# Patient Record
Sex: Female | Born: 1990
Health system: Southern US, Community
[De-identification: ages and names within clinical notes are randomized; demographics above are authoritative.]

## PROBLEM LIST (undated history)

## (undated) DIAGNOSIS — Z8489 Family history of other specified conditions: Secondary | ICD-10-CM

## (undated) DIAGNOSIS — R102 Pelvic and perineal pain: Secondary | ICD-10-CM

## (undated) DIAGNOSIS — R112 Nausea with vomiting, unspecified: Secondary | ICD-10-CM

## (undated) DIAGNOSIS — E039 Hypothyroidism, unspecified: Secondary | ICD-10-CM

## (undated) DIAGNOSIS — F419 Anxiety disorder, unspecified: Secondary | ICD-10-CM

## (undated) DIAGNOSIS — Z9889 Other specified postprocedural states: Secondary | ICD-10-CM

## (undated) DIAGNOSIS — K219 Gastro-esophageal reflux disease without esophagitis: Secondary | ICD-10-CM

## (undated) DIAGNOSIS — J189 Pneumonia, unspecified organism: Secondary | ICD-10-CM

## (undated) DIAGNOSIS — T1490XA Injury, unspecified, initial encounter: Secondary | ICD-10-CM

## (undated) DIAGNOSIS — Z87820 Personal history of traumatic brain injury: Secondary | ICD-10-CM

## (undated) DIAGNOSIS — R011 Cardiac murmur, unspecified: Secondary | ICD-10-CM

## (undated) DIAGNOSIS — M797 Fibromyalgia: Secondary | ICD-10-CM

## (undated) DIAGNOSIS — F329 Major depressive disorder, single episode, unspecified: Secondary | ICD-10-CM

## (undated) DIAGNOSIS — F32A Depression, unspecified: Secondary | ICD-10-CM

## (undated) HISTORY — PX: TONSILLECTOMY: SUR1361

## (undated) HISTORY — PX: ADENOIDECTOMY: SHX5191

## (undated) HISTORY — DX: Fibromyalgia: M79.7

## (undated) HISTORY — PX: SPINE SURGERY: SHX786

---
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
## 2009-04-28 ENCOUNTER — Emergency Department (HOSPITAL_COMMUNITY): Admission: EM | Admit: 2009-04-28 | Discharge: 2009-04-28 | Payer: Self-pay | Admitting: *Deleted

## 2009-04-28 DIAGNOSIS — J36 Peritonsillar abscess: Secondary | ICD-10-CM | POA: Insufficient documentation

## 2009-04-29 ENCOUNTER — Inpatient Hospital Stay (HOSPITAL_COMMUNITY): Admission: AD | Admit: 2009-04-29 | Discharge: 2009-05-08 | Payer: Self-pay | Admitting: Otolaryngology

## 2009-04-29 ENCOUNTER — Ambulatory Visit: Payer: Self-pay | Admitting: Pulmonary Disease

## 2009-05-23 ENCOUNTER — Ambulatory Visit: Payer: Self-pay | Admitting: Infectious Diseases

## 2009-05-23 DIAGNOSIS — R011 Cardiac murmur, unspecified: Secondary | ICD-10-CM | POA: Insufficient documentation

## 2009-05-23 DIAGNOSIS — D72829 Elevated white blood cell count, unspecified: Secondary | ICD-10-CM | POA: Insufficient documentation

## 2009-05-23 DIAGNOSIS — B2790 Infectious mononucleosis, unspecified without complication: Secondary | ICD-10-CM | POA: Insufficient documentation

## 2010-01-13 ENCOUNTER — Ambulatory Visit (HOSPITAL_BASED_OUTPATIENT_CLINIC_OR_DEPARTMENT_OTHER): Admission: RE | Admit: 2010-01-13 | Discharge: 2010-01-13 | Payer: Self-pay | Admitting: Otolaryngology

## 2010-09-12 DIAGNOSIS — T1490XA Injury, unspecified, initial encounter: Secondary | ICD-10-CM

## 2010-09-12 HISTORY — DX: Injury, unspecified, initial encounter: T14.90XA

## 2010-09-27 ENCOUNTER — Inpatient Hospital Stay (HOSPITAL_COMMUNITY)
Admission: EM | Admit: 2010-09-27 | Discharge: 2010-09-30 | Payer: Self-pay | Source: Home / Self Care | Admitting: Emergency Medicine

## 2010-12-12 NOTE — Miscellaneous (Signed)
Summary: HIPAA Restrictions  HIPAA Restrictions   Imported By: Florinda Marker 05/23/2009 14:54:46  _____________________________________________________________________  External Attachment:    Type:   Image     Comment:   External Document

## 2010-12-12 NOTE — Consult Note (Signed)
Summary: New Pt. Referral  New Pt. Referral   Imported By: Florinda Marker 07/14/2009 14:47:09  _____________________________________________________________________  External Attachment:    Type:   Image     Comment:   External Document

## 2010-12-12 NOTE — Assessment & Plan Note (Signed)
Summary: new pt increase wbc,recent admit to hosp&ICU   CC:  new patient.  History of Present Illness: 20 yo F with hx of mononucleosis and  then had 3 peritonsillar abscess- adm to [facility] 04-30-09. She underwent I & D of this on 6-19 after her CT scan showed progression of this. Her CT abd at that time showed splenomegally, her CT chest showed no PE, diffuse infitrate. Her course was complicated by the development of a severe rash. The etiology of this was unclear -TPN vs her antibiotics (vanco, zosyn). All of these were stopped and she was started on solumedrol. The patient was also felt to have pneumonia and after her rash wa improved she was started on 10 days of avleox as well as a steroid taper.  She returns now with increasing, persistent leukocytosis 17.7 (05-18-09), 15.5 (05-12-09). Path review showed reactive leukocytosis, no Eos on diff.  Has been feeling nauseated and dizzy since she has been out of the hospital. Finnished the avelox, will finish a second steroid taper tommorow. Recieved a seconds taper due to continued tonsillar swelling. She feels that they are still swollen, told by her ENT that they may be swollen for months.     Preventive Screening-Counseling & Management  Alcohol-Tobacco     Alcohol drinks/day: 0     Smoking Status: never  Caffeine-Diet-Exercise     Caffeine use/day: occassional tea     Does Patient Exercise: yes     Type of exercise: walks     Exercise (avg: min/session): <30     Times/week: 6  Safety-Violence-Falls     Seat Belt Use: yes      Drug Use:  no.     Updated Prior Medication List: CONCERTA 36 MG CR-TABS (METHYLPHENIDATE HCL) as needed PROMETHAZINE HCL 25 MG SUPP (PROMETHAZINE HCL) as needed ALPRAZOLAM 0.5 MG TABS (ALPRAZOLAM) 1 tab at bedtime as needed ZOLOFT 100 MG TABS (SERTRALINE HCL) 2 tabs once daily  Current Allergies (reviewed today): ! SULFA ! SEPTRA ! CECLOR Family History: ashtma, breast CA, DM, HTN  Social History: Single  Never Smoked Alcohol use-no Drug use-no Drug Use:  no  Review of Systems       1 loose BM this AM, nl urination, nl swallowing (occas pain), no fevers or chills, no abd pain, wt down, noyeast infections since out of hospital , rash mostly resolved. no cough or SOB.   Vital Signs:  Patient profile:   20 year old female Height:      65 inches (165.10 cm) Weight:      188.2 pounds (85.55 kg) BMI:     31.43 Temp:     98.0 degrees F (36.67 degrees C) oral Pulse rate:   97 / minute BP sitting:   130 / 79  (left arm)  Vitals Entered By: Baxter Hire) (May 23, 2009 11:37 AM) CC: new patient Is Patient Diabetic? No Pain Assessment Patient in pain? no      Nutritional Status BMI of > 30 = obese Nutritional Status Detail appetite is fine per patient  Have you ever been in a relationship where you felt threatened, hurt or afraid?Unable to ask-parents in room   Does patient need assistance? Functional Status Self care Ambulation Normal   Physical Exam  General:  well-developed, well-nourished, well-hydrated, and overweight-appearing.   Eyes:  pupils equal and pupils round.   Mouth:  pharynx pink and moist, no exudates, and posterior lymphoid hypertrophy.  she has massive tonsils bilaterally. there is no  exudate or necrosis.  Neck:  no masses.   Lungs:  normal respiratory effort and normal breath sounds.   Heart:  normal rate, regular rhythm, and no murmur.   Abdomen:  soft, non-tender, normal bowel sounds, and no splenomegaly.   Skin:  she has a faint reticulated, macular rash on her UE and her LE.    Impression & Recommendations:  Problem # 1:  LEUKOCYTOSIS (ICD-288.60)  She does to appear toxic in any way. There are several possibilities to consider- demargination from steroids, unresolved tonsilar abscesses,  pneumonia/lung abscess, unresolved adverse drug reaction. She does not have symptoms to match the lung consideration. Her differential does not show Eos as  would expected from an ADR. I suggested to her and her parents that this is most likely due to the steroids (her third course) and that rechecking her WBC next week would be a reasonable course of action. If it continues to be elavated would favor a repeat CT scan of her neck to re-eval her tonsilar tissue. I will ask Dr Yehuda Budd to order the CBC next week, when pt follows up with her. I will ask that the results of this be sent to me, furhter testing appts will be based on this. SHe and her parents are in agreement with this plan.  She asks about resuming activity (horseback riding). Splenic rupture usually occurs between the 4th- 21st day of symptomatic illness. She is well past this and I have suggested that she may resume exerting herself slowly.   Orders: Consultation Level IV (95621)  Medications Added to Medication List This Visit: 1)  Concerta 36 Mg Cr-tabs (Methylphenidate hcl) .... As needed 2)  Promethazine Hcl 25 Mg Supp (Promethazine hcl) .... As needed 3)  Alprazolam 0.5 Mg Tabs (Alprazolam) .Marland Kitchen.. 1 tab at bedtime as needed 4)  Zoloft 100 Mg Tabs (Sertraline hcl) .... 2 tabs once daily

## 2011-01-23 LAB — DIFFERENTIAL
Basophils Absolute: 0 10*3/uL (ref 0.0–0.1)
Basophils Relative: 0 % (ref 0–1)
Eosinophils Absolute: 0.1 10*3/uL (ref 0.0–0.7)
Eosinophils Relative: 1 % (ref 0–5)
Lymphocytes Relative: 14 % (ref 12–46)
Lymphs Abs: 1.8 10*3/uL (ref 0.7–4.0)
Monocytes Absolute: 0.8 10*3/uL (ref 0.1–1.0)
Monocytes Relative: 6 % (ref 3–12)
Neutro Abs: 10.6 10*3/uL — ABNORMAL HIGH (ref 1.7–7.7)
Neutrophils Relative %: 80 % — ABNORMAL HIGH (ref 43–77)

## 2011-01-23 LAB — BASIC METABOLIC PANEL
BUN: 4 mg/dL — ABNORMAL LOW (ref 6–23)
CO2: 28 mEq/L (ref 19–32)
Calcium: 8.2 mg/dL — ABNORMAL LOW (ref 8.4–10.5)
Chloride: 104 mEq/L (ref 96–112)
Creatinine, Ser: 0.61 mg/dL (ref 0.4–1.2)
GFR calc Af Amer: >60 mL/min (ref >60)
GFR calc non Af Amer: >60 mL/min (ref >60)
Glucose, Bld: 132 mg/dL — ABNORMAL HIGH (ref 70–99)
Potassium: 4 mEq/L (ref 3.5–5.1)
Sodium: 137 mEq/L (ref 135–145)

## 2011-01-23 LAB — CBC
HCT: 34.3 % — ABNORMAL LOW (ref 36.0–46.0)
HCT: 34.7 % — ABNORMAL LOW (ref 36.0–46.0)
HCT: 34.9 % — ABNORMAL LOW (ref 36.0–46.0)
HCT: 34.9 % — ABNORMAL LOW (ref 36.0–46.0)
HCT: 35.7 % — ABNORMAL LOW (ref 36.0–46.0)
HCT: 37.5 % (ref 36.0–46.0)
Hemoglobin: 10.7 g/dL — ABNORMAL LOW (ref 12.0–15.0)
Hemoglobin: 11 g/dL — ABNORMAL LOW (ref 12.0–15.0)
Hemoglobin: 11 g/dL — ABNORMAL LOW (ref 12.0–15.0)
Hemoglobin: 11 g/dL — ABNORMAL LOW (ref 12.0–15.0)
Hemoglobin: 11.1 g/dL — ABNORMAL LOW (ref 12.0–15.0)
Hemoglobin: 11.8 g/dL — ABNORMAL LOW (ref 12.0–15.0)
MCH: 23.5 pg — ABNORMAL LOW (ref 26.0–34.0)
MCH: 23.6 pg — ABNORMAL LOW (ref 26.0–34.0)
MCH: 23.6 pg — ABNORMAL LOW (ref 26.0–34.0)
MCH: 23.8 pg — ABNORMAL LOW (ref 26.0–34.0)
MCH: 23.8 pg — ABNORMAL LOW (ref 26.0–34.0)
MCH: 24 pg — ABNORMAL LOW (ref 26.0–34.0)
MCHC: 30.8 g/dL (ref 30.0–36.0)
MCHC: 31.2 g/dL (ref 30.0–36.0)
MCHC: 31.5 g/dL (ref 30.0–36.0)
MCHC: 31.5 g/dL (ref 30.0–36.0)
MCHC: 31.7 g/dL (ref 30.0–36.0)
MCHC: 31.8 g/dL (ref 30.0–36.0)
MCV: 74.7 fL — ABNORMAL LOW (ref 78.0–100.0)
MCV: 74.7 fL — ABNORMAL LOW (ref 78.0–100.0)
MCV: 75.2 fL — ABNORMAL LOW (ref 78.0–100.0)
MCV: 75.6 fL — ABNORMAL LOW (ref 78.0–100.0)
MCV: 76.1 fL — ABNORMAL LOW (ref 78.0–100.0)
MCV: 76.4 fL — ABNORMAL LOW (ref 78.0–100.0)
Platelets: 340 10*3/uL (ref 150–400)
Platelets: 343 10*3/uL (ref 150–400)
Platelets: 358 10*3/uL (ref 150–400)
Platelets: 369 10*3/uL (ref 150–400)
Platelets: 376 10*3/uL (ref 150–400)
Platelets: 385 10*3/uL (ref 150–400)
RBC: 4.49 MIL/uL (ref 3.87–5.11)
RBC: 4.59 MIL/uL (ref 3.87–5.11)
RBC: 4.67 MIL/uL (ref 3.87–5.11)
RBC: 4.67 MIL/uL (ref 3.87–5.11)
RBC: 4.69 MIL/uL (ref 3.87–5.11)
RBC: 4.99 MIL/uL (ref 3.87–5.11)
RDW: 15.1 % (ref 11.5–15.5)
RDW: 15.2 % (ref 11.5–15.5)
RDW: 15.2 % (ref 11.5–15.5)
RDW: 15.3 % (ref 11.5–15.5)
RDW: 15.4 % (ref 11.5–15.5)
RDW: 15.4 % (ref 11.5–15.5)
WBC: 11.1 10*3/uL — ABNORMAL HIGH (ref 4.0–10.5)
WBC: 11.7 10*3/uL — ABNORMAL HIGH (ref 4.0–10.5)
WBC: 13.3 10*3/uL — ABNORMAL HIGH (ref 4.0–10.5)
WBC: 6.1 10*3/uL (ref 4.0–10.5)
WBC: 6.3 10*3/uL (ref 4.0–10.5)
WBC: 7.7 10*3/uL (ref 4.0–10.5)

## 2011-01-23 LAB — COMPREHENSIVE METABOLIC PANEL
ALT: 115 U/L — ABNORMAL HIGH (ref 0–35)
AST: 161 U/L — ABNORMAL HIGH (ref 0–37)
Albumin: 3.4 g/dL — ABNORMAL LOW (ref 3.5–5.2)
Alkaline Phosphatase: 87 U/L (ref 39–117)
BUN: 7 mg/dL (ref 6–23)
CO2: 25 mEq/L (ref 19–32)
Calcium: 8.6 mg/dL (ref 8.4–10.5)
Chloride: 105 mEq/L (ref 96–112)
Creatinine, Ser: 0.68 mg/dL (ref 0.4–1.2)
GFR calc Af Amer: >60 mL/min (ref >60)
GFR calc non Af Amer: >60 mL/min (ref >60)
Glucose, Bld: 108 mg/dL — ABNORMAL HIGH (ref 70–99)
Potassium: 3.8 mEq/L (ref 3.5–5.1)
Sodium: 136 mEq/L (ref 135–145)
Total Bilirubin: 0.2 mg/dL — ABNORMAL LOW (ref 0.3–1.2)
Total Protein: 6.8 g/dL (ref 6.0–8.3)

## 2011-01-23 LAB — SAMPLE TO BLOOD BANK

## 2011-01-23 LAB — MRSA PCR SCREENING: MRSA by PCR: NEGATIVE

## 2011-02-04 LAB — POCT HEMOGLOBIN-HEMACUE: Hemoglobin: 11.7 g/dL — ABNORMAL LOW (ref 12.0–15.0)

## 2011-02-19 LAB — BASIC METABOLIC PANEL
BUN: 17 mg/dL (ref 6–23)
BUN: 18 mg/dL (ref 6–23)
BUN: 19 mg/dL (ref 6–23)
BUN: 7 mg/dL (ref 6–23)
CO2: 24 mEq/L (ref 19–32)
CO2: 25 mEq/L (ref 19–32)
CO2: 27 mEq/L (ref 19–32)
CO2: 28 mEq/L (ref 19–32)
Calcium: 8.1 mg/dL — ABNORMAL LOW (ref 8.4–10.5)
Calcium: 8.4 mg/dL (ref 8.4–10.5)
Calcium: 8.5 mg/dL (ref 8.4–10.5)
Calcium: 8.6 mg/dL (ref 8.4–10.5)
Chloride: 105 mEq/L (ref 96–112)
Chloride: 106 mEq/L (ref 96–112)
Chloride: 109 mEq/L (ref 96–112)
Chloride: 112 mEq/L (ref 96–112)
Creatinine, Ser: 1.16 mg/dL (ref 0.4–1.2)
Creatinine, Ser: 1.25 mg/dL — ABNORMAL HIGH (ref 0.4–1.2)
Creatinine, Ser: 1.34 mg/dL — ABNORMAL HIGH (ref 0.4–1.2)
Creatinine, Ser: 1.52 mg/dL — ABNORMAL HIGH (ref 0.4–1.2)
GFR calc Af Amer: 53 mL/min — ABNORMAL LOW (ref >60)
GFR calc Af Amer: >60 mL/min (ref >60)
GFR calc Af Amer: >60 mL/min (ref >60)
GFR calc Af Amer: >60 mL/min (ref >60)
GFR calc non Af Amer: 44 mL/min — ABNORMAL LOW (ref >60)
GFR calc non Af Amer: 51 mL/min — ABNORMAL LOW (ref >60)
GFR calc non Af Amer: 55 mL/min — ABNORMAL LOW (ref >60)
GFR calc non Af Amer: >60 mL/min (ref >60)
Glucose, Bld: 107 mg/dL — ABNORMAL HIGH (ref 70–99)
Glucose, Bld: 162 mg/dL — ABNORMAL HIGH (ref 70–99)
Glucose, Bld: 181 mg/dL — ABNORMAL HIGH (ref 70–99)
Glucose, Bld: 96 mg/dL (ref 70–99)
Potassium: 3 mEq/L — ABNORMAL LOW (ref 3.5–5.1)
Potassium: 3.3 mEq/L — ABNORMAL LOW (ref 3.5–5.1)
Potassium: 4.3 mEq/L (ref 3.5–5.1)
Potassium: 4.4 mEq/L (ref 3.5–5.1)
Sodium: 138 mEq/L (ref 135–145)
Sodium: 138 mEq/L (ref 135–145)
Sodium: 141 mEq/L (ref 135–145)
Sodium: 142 mEq/L (ref 135–145)

## 2011-02-19 LAB — AFB CULTURE WITH SMEAR (NOT AT ARMC): Acid Fast Smear: NONE SEEN

## 2011-02-19 LAB — DIFFERENTIAL
Basophils Absolute: 0 10*3/uL (ref 0.0–0.1)
Basophils Absolute: 0 10*3/uL (ref 0.0–0.1)
Basophils Absolute: 0 10*3/uL (ref 0.0–0.1)
Basophils Absolute: 0 10*3/uL (ref 0.0–0.1)
Basophils Relative: 0 % (ref 0–1)
Basophils Relative: 0 % (ref 0–1)
Basophils Relative: 0 % (ref 0–1)
Basophils Relative: 0 % (ref 0–1)
Eosinophils Absolute: 0 10*3/uL (ref 0.0–0.7)
Eosinophils Absolute: 0.9 10*3/uL — ABNORMAL HIGH (ref 0.0–0.7)
Eosinophils Absolute: 1.1 10*3/uL — ABNORMAL HIGH (ref 0.0–0.7)
Eosinophils Absolute: 1.6 10*3/uL — ABNORMAL HIGH (ref 0.0–0.7)
Eosinophils Relative: 0 % (ref 0–5)
Eosinophils Relative: 10 % — ABNORMAL HIGH (ref 0–5)
Eosinophils Relative: 11 % — ABNORMAL HIGH (ref 0–5)
Eosinophils Relative: 7 % — ABNORMAL HIGH (ref 0–5)
Lymphocytes Relative: 14 % (ref 12–46)
Lymphocytes Relative: 22 % (ref 12–46)
Lymphocytes Relative: 25 % (ref 12–46)
Lymphocytes Relative: 8 % — ABNORMAL LOW (ref 12–46)
Lymphs Abs: 1.3 10*3/uL (ref 0.7–4.0)
Lymphs Abs: 2.2 10*3/uL (ref 0.7–4.0)
Lymphs Abs: 2.3 10*3/uL (ref 0.7–4.0)
Lymphs Abs: 3.4 10*3/uL (ref 0.7–4.0)
Monocytes Absolute: 0.7 10*3/uL (ref 0.1–1.0)
Monocytes Absolute: 0.8 10*3/uL (ref 0.1–1.0)
Monocytes Absolute: 1.4 10*3/uL — ABNORMAL HIGH (ref 0.1–1.0)
Monocytes Absolute: 1.5 10*3/uL — ABNORMAL HIGH (ref 0.1–1.0)
Monocytes Relative: 10 % (ref 3–12)
Monocytes Relative: 4 % (ref 3–12)
Monocytes Relative: 8 % (ref 3–12)
Monocytes Relative: 9 % (ref 3–12)
Neutro Abs: 11.2 10*3/uL — ABNORMAL HIGH (ref 1.7–7.7)
Neutro Abs: 14.8 10*3/uL — ABNORMAL HIGH (ref 1.7–7.7)
Neutro Abs: 6.1 10*3/uL (ref 1.7–7.7)
Neutro Abs: 8 10*3/uL — ABNORMAL HIGH (ref 1.7–7.7)
Neutrophils Relative %: 58 % (ref 43–77)
Neutrophils Relative %: 59 % (ref 43–77)
Neutrophils Relative %: 67 % (ref 43–77)
Neutrophils Relative %: 88 % — ABNORMAL HIGH (ref 43–77)

## 2011-02-19 LAB — COMPREHENSIVE METABOLIC PANEL
ALT: 23 U/L (ref 0–35)
ALT: 32 U/L (ref 0–35)
ALT: 44 U/L — ABNORMAL HIGH (ref 0–35)
AST: 21 U/L (ref 0–37)
AST: 25 U/L (ref 0–37)
AST: 33 U/L (ref 0–37)
Albumin: 2.7 g/dL — ABNORMAL LOW (ref 3.5–5.2)
Albumin: 2.8 g/dL — ABNORMAL LOW (ref 3.5–5.2)
Albumin: 2.9 g/dL — ABNORMAL LOW (ref 3.5–5.2)
Alkaline Phosphatase: 78 U/L (ref 39–117)
Alkaline Phosphatase: 81 U/L (ref 39–117)
Alkaline Phosphatase: 87 U/L (ref 39–117)
BUN: 21 mg/dL (ref 6–23)
BUN: 3 mg/dL — ABNORMAL LOW (ref 6–23)
BUN: 9 mg/dL (ref 6–23)
CO2: 22 mEq/L (ref 19–32)
CO2: 29 mEq/L (ref 19–32)
CO2: 32 mEq/L (ref 19–32)
Calcium: 8.3 mg/dL — ABNORMAL LOW (ref 8.4–10.5)
Calcium: 8.4 mg/dL (ref 8.4–10.5)
Calcium: 8.7 mg/dL (ref 8.4–10.5)
Chloride: 104 mEq/L (ref 96–112)
Chloride: 105 mEq/L (ref 96–112)
Chloride: 106 mEq/L (ref 96–112)
Creatinine, Ser: 0.89 mg/dL (ref 0.4–1.2)
Creatinine, Ser: 1.39 mg/dL — ABNORMAL HIGH (ref 0.4–1.2)
Creatinine, Ser: 1.43 mg/dL — ABNORMAL HIGH (ref 0.4–1.2)
GFR calc Af Amer: 57 mL/min — ABNORMAL LOW (ref >60)
GFR calc Af Amer: 59 mL/min — ABNORMAL LOW (ref >60)
GFR calc Af Amer: >60 mL/min (ref >60)
GFR calc non Af Amer: 47 mL/min — ABNORMAL LOW (ref >60)
GFR calc non Af Amer: 49 mL/min — ABNORMAL LOW (ref >60)
GFR calc non Af Amer: >60 mL/min (ref >60)
Glucose, Bld: 103 mg/dL — ABNORMAL HIGH (ref 70–99)
Glucose, Bld: 104 mg/dL — ABNORMAL HIGH (ref 70–99)
Glucose, Bld: 88 mg/dL (ref 70–99)
Potassium: 2.7 mEq/L — CL (ref 3.5–5.1)
Potassium: 2.8 mEq/L — ABNORMAL LOW (ref 3.5–5.1)
Potassium: 4.3 mEq/L (ref 3.5–5.1)
Sodium: 135 mEq/L (ref 135–145)
Sodium: 140 mEq/L (ref 135–145)
Sodium: 144 mEq/L (ref 135–145)
Total Bilirubin: 0.5 mg/dL (ref 0.3–1.2)
Total Bilirubin: 1 mg/dL (ref 0.3–1.2)
Total Bilirubin: 1.1 mg/dL (ref 0.3–1.2)
Total Protein: 6.1 g/dL (ref 6.0–8.3)
Total Protein: 6.3 g/dL (ref 6.0–8.3)
Total Protein: 6.6 g/dL (ref 6.0–8.3)

## 2011-02-19 LAB — GLUCOSE, CAPILLARY
Glucose-Capillary: 101 mg/dL — ABNORMAL HIGH (ref 70–99)
Glucose-Capillary: 102 mg/dL — ABNORMAL HIGH (ref 70–99)
Glucose-Capillary: 102 mg/dL — ABNORMAL HIGH (ref 70–99)
Glucose-Capillary: 105 mg/dL — ABNORMAL HIGH (ref 70–99)
Glucose-Capillary: 107 mg/dL — ABNORMAL HIGH (ref 70–99)
Glucose-Capillary: 107 mg/dL — ABNORMAL HIGH (ref 70–99)
Glucose-Capillary: 109 mg/dL — ABNORMAL HIGH (ref 70–99)
Glucose-Capillary: 109 mg/dL — ABNORMAL HIGH (ref 70–99)
Glucose-Capillary: 111 mg/dL — ABNORMAL HIGH (ref 70–99)
Glucose-Capillary: 111 mg/dL — ABNORMAL HIGH (ref 70–99)
Glucose-Capillary: 113 mg/dL — ABNORMAL HIGH (ref 70–99)
Glucose-Capillary: 118 mg/dL — ABNORMAL HIGH (ref 70–99)
Glucose-Capillary: 125 mg/dL — ABNORMAL HIGH (ref 70–99)
Glucose-Capillary: 125 mg/dL — ABNORMAL HIGH (ref 70–99)
Glucose-Capillary: 132 mg/dL — ABNORMAL HIGH (ref 70–99)
Glucose-Capillary: 138 mg/dL — ABNORMAL HIGH (ref 70–99)
Glucose-Capillary: 88 mg/dL (ref 70–99)
Glucose-Capillary: 90 mg/dL (ref 70–99)
Glucose-Capillary: 96 mg/dL (ref 70–99)
Glucose-Capillary: 97 mg/dL (ref 70–99)

## 2011-02-19 LAB — CBC
HCT: 30.4 % — ABNORMAL LOW (ref 36.0–46.0)
HCT: 31.1 % — ABNORMAL LOW (ref 36.0–46.0)
HCT: 31.3 % — ABNORMAL LOW (ref 36.0–46.0)
HCT: 31.5 % — ABNORMAL LOW (ref 36.0–46.0)
HCT: 31.5 % — ABNORMAL LOW (ref 36.0–46.0)
HCT: 31.6 % — ABNORMAL LOW (ref 36.0–46.0)
HCT: 33 % — ABNORMAL LOW (ref 36.0–46.0)
HCT: 37.3 % (ref 36.0–46.0)
Hemoglobin: 10.1 g/dL — ABNORMAL LOW (ref 12.0–15.0)
Hemoglobin: 10.4 g/dL — ABNORMAL LOW (ref 12.0–15.0)
Hemoglobin: 10.4 g/dL — ABNORMAL LOW (ref 12.0–15.0)
Hemoglobin: 10.4 g/dL — ABNORMAL LOW (ref 12.0–15.0)
Hemoglobin: 10.6 g/dL — ABNORMAL LOW (ref 12.0–15.0)
Hemoglobin: 10.6 g/dL — ABNORMAL LOW (ref 12.0–15.0)
Hemoglobin: 11.1 g/dL — ABNORMAL LOW (ref 12.0–15.0)
Hemoglobin: 12.6 g/dL (ref 12.0–15.0)
MCHC: 33.2 g/dL (ref 30.0–36.0)
MCHC: 33.2 g/dL (ref 30.0–36.0)
MCHC: 33.4 g/dL (ref 30.0–36.0)
MCHC: 33.5 g/dL (ref 30.0–36.0)
MCHC: 33.6 g/dL (ref 30.0–36.0)
MCHC: 33.6 g/dL (ref 30.0–36.0)
MCHC: 33.6 g/dL (ref 30.0–36.0)
MCHC: 33.8 g/dL (ref 30.0–36.0)
MCV: 75.3 fL — ABNORMAL LOW (ref 78.0–100.0)
MCV: 75.7 fL — ABNORMAL LOW (ref 78.0–100.0)
MCV: 75.9 fL — ABNORMAL LOW (ref 78.0–100.0)
MCV: 75.9 fL — ABNORMAL LOW (ref 78.0–100.0)
MCV: 75.9 fL — ABNORMAL LOW (ref 78.0–100.0)
MCV: 76.1 fL — ABNORMAL LOW (ref 78.0–100.0)
MCV: 76.1 fL — ABNORMAL LOW (ref 78.0–100.0)
MCV: 76.3 fL — ABNORMAL LOW (ref 78.0–100.0)
Platelets: 215 10*3/uL (ref 150–400)
Platelets: 240 10*3/uL (ref 150–400)
Platelets: 243 10*3/uL (ref 150–400)
Platelets: 244 10*3/uL (ref 150–400)
Platelets: 281 10*3/uL (ref 150–400)
Platelets: 289 10*3/uL (ref 150–400)
Platelets: 297 10*3/uL (ref 150–400)
Platelets: 334 10*3/uL (ref 150–400)
RBC: 4 MIL/uL (ref 3.87–5.11)
RBC: 4.07 MIL/uL (ref 3.87–5.11)
RBC: 4.14 MIL/uL (ref 3.87–5.11)
RBC: 4.15 MIL/uL (ref 3.87–5.11)
RBC: 4.16 MIL/uL (ref 3.87–5.11)
RBC: 4.16 MIL/uL (ref 3.87–5.11)
RBC: 4.36 MIL/uL (ref 3.87–5.11)
RBC: 4.9 MIL/uL (ref 3.87–5.11)
RDW: 14.3 % (ref 11.5–15.5)
RDW: 14.4 % (ref 11.5–15.5)
RDW: 14.6 % (ref 11.5–15.5)
RDW: 14.6 % (ref 11.5–15.5)
RDW: 14.8 % (ref 11.5–15.5)
RDW: 14.9 % (ref 11.5–15.5)
RDW: 15 % (ref 11.5–15.5)
RDW: 15.2 % (ref 11.5–15.5)
WBC: 10.2 10*3/uL (ref 4.0–10.5)
WBC: 11 10*3/uL — ABNORMAL HIGH (ref 4.0–10.5)
WBC: 11.3 10*3/uL — ABNORMAL HIGH (ref 4.0–10.5)
WBC: 12 10*3/uL — ABNORMAL HIGH (ref 4.0–10.5)
WBC: 13.8 10*3/uL — ABNORMAL HIGH (ref 4.0–10.5)
WBC: 16.6 10*3/uL — ABNORMAL HIGH (ref 4.0–10.5)
WBC: 16.8 10*3/uL — ABNORMAL HIGH (ref 4.0–10.5)
WBC: 9.5 10*3/uL (ref 4.0–10.5)

## 2011-02-19 LAB — EXPECTORATED SPUTUM ASSESSMENT W GRAM STAIN, RFLX TO RESP C

## 2011-02-19 LAB — CULTURE, RESPIRATORY: Culture: NORMAL

## 2011-02-19 LAB — POCT I-STAT, CHEM 8
BUN: 12 mg/dL (ref 6–23)
Calcium, Ion: 1.01 mmol/L — ABNORMAL LOW (ref 1.12–1.32)
Chloride: 105 mEq/L (ref 96–112)
Creatinine, Ser: 0.8 mg/dL (ref 0.4–1.2)
Glucose, Bld: 99 mg/dL (ref 70–99)
HCT: 39 % (ref 36.0–46.0)
Hemoglobin: 13.3 g/dL (ref 12.0–15.0)
Potassium: 3.9 mEq/L (ref 3.5–5.1)
Sodium: 137 mEq/L (ref 135–145)
TCO2: 25 mmol/L (ref 0–100)

## 2011-02-19 LAB — URINALYSIS, MICROSCOPIC ONLY
Bilirubin Urine: NEGATIVE
Glucose, UA: NEGATIVE mg/dL
Hgb urine dipstick: NEGATIVE
Ketones, ur: 15 mg/dL — AB
Nitrite: NEGATIVE
Protein, ur: NEGATIVE mg/dL
Specific Gravity, Urine: 1.01 (ref 1.005–1.030)
Urobilinogen, UA: 0.2 mg/dL (ref 0.0–1.0)
pH: 6 (ref 5.0–8.0)

## 2011-02-19 LAB — STREP A DNA PROBE: Group A Strep Probe: NEGATIVE

## 2011-02-19 LAB — FUNGUS CULTURE W SMEAR: Fungal Smear: NONE SEEN

## 2011-02-19 LAB — CULTURE, BLOOD (ROUTINE X 2)
Culture: NO GROWTH
Culture: NO GROWTH

## 2011-02-19 LAB — EXPECTORATED SPUTUM ASSESSMENT W REFEX TO RESP CULTURE

## 2011-02-19 LAB — CHOLESTEROL, TOTAL: Cholesterol: 161 mg/dL (ref 0–200)

## 2011-02-19 LAB — URINE CULTURE: Colony Count: >=100000

## 2011-02-19 LAB — PHOSPHORUS
Phosphorus: 3.6 mg/dL (ref 2.3–4.6)
Phosphorus: 3.7 mg/dL (ref 2.3–4.6)
Phosphorus: 3.9 mg/dL (ref 2.3–4.6)

## 2011-02-19 LAB — POCT PREGNANCY, URINE: Preg Test, Ur: NEGATIVE

## 2011-02-19 LAB — CULTURE, RESPIRATORY W GRAM STAIN

## 2011-02-19 LAB — MONONUCLEOSIS SCREEN: Mono Screen: POSITIVE — AB

## 2011-02-19 LAB — MAGNESIUM
Magnesium: 1.9 mg/dL (ref 1.5–2.5)
Magnesium: 2.1 mg/dL (ref 1.5–2.5)
Magnesium: 2.2 mg/dL (ref 1.5–2.5)

## 2011-02-19 LAB — SEDIMENTATION RATE: Sed Rate: 35 mm/hr — ABNORMAL HIGH (ref 0–22)

## 2011-02-19 LAB — TRIGLYCERIDES: Triglycerides: 199 mg/dL — ABNORMAL HIGH (ref <150)

## 2011-02-19 LAB — RAPID STREP SCREEN (MED CTR MEBANE ONLY): Streptococcus, Group A Screen (Direct): NEGATIVE

## 2011-02-19 LAB — PREALBUMIN: Prealbumin: 11.3 mg/dL — ABNORMAL LOW (ref 18.0–45.0)

## 2011-03-27 NOTE — Discharge Summary (Signed)
NAME:  Dawn King, Dawn King NO.:  [id number]   MEDICAL RECORD NO.:  [id number]          PATIENT TYPE:  INP   LOCATION:  5526                         FACILITY:  [facility]   PHYSICIAN:  Altha Harm, MDDATE OF BIRTH:  Sep 09, 1991   DATE OF ADMISSION:  04/29/2009  DATE OF DISCHARGE:  05/08/2009                               DISCHARGE SUMMARY   DISCHARGE DISPOSITION:  Home.   FINAL DISCHARGE DIAGNOSES:  1. Peritonsillar abscess.  2. Mononucleosis x1 month.  3. Left lower lobe pneumonia.  4. Acute hypoxic respiratory failure resolved.  5. Allergic reaction unclear to exactly what medication, most likely      the TPN however.  6. Maculopapular pruritic rash.  7. Hypokalemia, resolved.  8. Yeast urinary tract infection fully treated.  9. History of depression.  10.History of hyperlipidemia.  11.History of osteopenia.   DISCHARGE MEDICATIONS:  Include the following:  1. Zoloft 200 mg p.o. q.h.s.  2. Oral contraceptives.  3. Vitamin D 2000 units p.o. q.a.m.  4. Calcium 500 mg p.o. q.a.m.  5. Fish oil 1 tablet p.o. q.a.m.  6. Avelox 400 mg p.o. daily x10 days.  7. Prednisone 40 mg p.o. daily x3.  8. Benadryl 25 mg p.o. q.8 h p.r.n. for itching.  9. One adult EpiPen to be used as directed.   CONSULTANTS:  1. Critical care medicine.  2. [facility].   PROCEDURES:  1. Status post incision and drainage of peritonsillar abscess done on      June 19.  2. PICC line placement.   DIAGNOSTIC STUDIES:  1. Two view chest x-ray done on admission which shows no active      cardiopulmonary disease.  2. CT of the neck with contrast done on June 17 which shows bilateral      tonsillitis with peritonsillar abscess formation on the right.      Significant adenoid hypertrophy.  Nasopharyngeal airway compromise.      The oropharyngeal airway is mildly narrowed.  Marked reactive      lymphadenopathy in the upper neck.  3. Repeat CT of the neck with contrast done on June  18 which shows      progression of the right peritonsillar abscess with posterior      inferior infection at the level of the right piriform sinus.  The      right palatine tonsil has increased in size with further narrowing      of the airway.  Tiny left palatine tonsil abscess of 4 mm.  4. CT angiogram of chest done on June 20 which shows diffuse bilateral      pulmonary infiltrates, likely infectious.  Technically adequate      examination showed no evidence for acute pulmonary embolus.  5. CT of the abdomen with contrast done on June 20 which showed      splenomegaly.  Slightly prominent mesenteric lymph nodes consistent      with mesenteric adenitis.  6. Portable chest x-ray done on June 21 which shows bibasilar      pneumonia, left greater than right.  7. Repeat chest x-ray on June 21  after PICC line placement showing      good positioning.  8. Followup chest x-ray done on June 22 which shows slightly more      opacity at the left lung base, cannot exclude pneumonia.  Repeat      chest x-ray shows increased left effusion and bibasilar air space      disease.  Repeat chest x-ray on June 24 which shows improved left      basilar atelectasis.  Persistent perihilar air space disease of      chest, mild pulmonary edema.  Two view chest x-ray done on June 26      shows improving but residual left lung air space disease and      pneumonia.   CODE STATUS:  Full code.   ALLERGIES:  1. SULFA.  2. CEPHALOSPORINS.   CHIEF COMPLAINT:  Increasing sore throat.   HISTORY OF PRESENT ILLNESS:  This is a 20 year old who had a history of  mononucleosis developing last month.  Developed increasing sore and was  found to have a right peritonsillar abscess in the emergency room.   HOSPITAL COURSE:  The patient was admitted initially by Dr. Suzanna Obey,  ears, nose and throat.  The patient underwent an I and D first on June  17 and then again on June 19.  Postop on June 20 the patient did  develop  an increasing cough and hypoxic respiratory failure.  Bilateral  infiltrates were noted on chest x-ray and the patient was transferred to  the critical care medicine service for further evaluation and treatment.  At that time the patient had been on vancomycin and Zosyn which were  continued.  The patient did not require intubation and was given  supportive care throughout her ICU stay.  Her wound cultures and blood  cultures did not show any pathogens when speciated.  The patient was  continued on Zosyn and vancomycin.  On June 21 she was noted to have a  rash in the intertriginous areas and this was assumed to be a candidal  rash.  Please note that the timing of the rash was also concurrent with  the start of total parenteral nutrition.  The patient was continued on  her antibiotics and started on her TPN.  As the days increased  apparently the rash continued to expand and became pruritic.  The  patient was transferred to our service, the hospitalist service, on June  24.  On that day the patient was seen by Dr. Waymon Amato who felt that the  rash was extensive and might be related to her medications.  Thus he  changed her from vancomycin and Zosyn over to Flagyl and clindamycin.  By the afternoon of June 24 the patient's rash has gotten significantly  worse.  The rash was now extending over her trunk, extremities, was  maculopapular in nature and pruritic.  At that point all the antibiotics  were stopped as well as the TPN.  The patient was started on Solu-Medrol  and given Atarax for itching.  Over the next few days her rash improved  considerably.  However, in light of the fact that the patient did have a  pneumonia which was not fully treated I have opted to place the patient  back on Avelox for treatment of her pneumonia.  The patient has taken  Avelox in the past without any reaction to this.  At this point it  really is unclear as to whether or not the rash was  triggered by  her  antibiotics or whether it was the TPN.  Nevertheless, the antibiotics  the patient was on at the time included vancomycin, Zosyn, clindamycin  and Flagyl.  Please note that the patient does have a known reaction to  both sulfa and cephalosporins thus the Zosyn certainly could have been  implicated in the rash formation.  The plan is for the patient to be  discharged home on her usual medications in addition to which she will  take Avelox for 10 days.  The patient also will complete her course of  steroids as treatment for her maculopapular hypersensitivity rash.  She  will also be sent home on an epinephrine pen due to the fact that the  patient is unable to identify at this time what exactly caused her rash.   In terms of her peritonsillar abscesses the patient was fully treated  for that and Dr. Jearld Fenton did not recommend any further antibiotics.  The  patient has no stridor at this point and she is tolerating her diet  without any difficulty.   DIETARY RESTRICTIONS:  None.   PHYSICAL RESTRICTIONS:  None.   FOLLOWUP:  I will ask the patient to follow up with her primary care  doctor, Dr. Herb Grays, in 5-7 days.   Total time for this discharge process 50 minutes.      Altha Harm, MD  Electronically Signed     MAM/MEDQ  D:  05/08/2009  T:  05/08/2009  Job:  [id number]   cc:   Tammy R. Collins [place], M.D.

## 2011-03-27 NOTE — Op Note (Signed)
NAME:  Dawn King, Dawn King NO.:  [id number]   MEDICAL RECORD NO.:  [id number]          PATIENT TYPE:  INP   LOCATION:  5127                         FACILITY:  [facility]   PHYSICIAN:  Suzanna Obey, M.D.       DATE OF BIRTH:  26-Aug-1991   DATE OF PROCEDURE:  04/30/2009  DATE OF DISCHARGE:                               OPERATIVE REPORT   PREOPERATIVE DIAGNOSIS:  Bilateral peritonsillar abscess.   POSTOPERATIVE DIAGNOSIS:  Bilateral peritonsillar abscess.   SURGICAL PROCEDURE:  Incision and drainage of bilateral peritonsillar  abscess.   ANESTHESIA:  General.   ESTIMATED BLOOD LOSS:  Less than 5 mL.   INDICATIONS:  This is a 20 year old who has had long history of mono for  probably the last month.  She developed increasing sore throat and had a  right peritonsillar abscess drained in the emergency room 2 days ago and  she then presented back to the office with increasing pain and swelling.  She was admitted, underwent a CT scan, which showed persistence and  increased abscess collection on the right and a new left small  peritonsillar abscess.  The patient was admitted IV antibiotics and then  following that 12-hour treatments, she was not improved and the  procedure was discussed.  Risks and benefits were discussed as well as  options.  All questions were answered and consent was obtained.   OPERATION:  The patient was taken to the operating room, placed in the  supine position.  After a general endotracheal tube anesthesia, she was  placed in the supine position, draped in the usual sterile manner.  The  Crowe-Davis mouth gag was inserted, retracted, and suspended from the  Mayo stand.  The right tonsil was opened with electrocautery.  It was  opened laterally to the peritonsillar space.  This was opened, pus was  expressed.  Once the tonsillar hemostat was inserted, there was a large  cavity that was opened up down towards the vallecula extending inferior  to the  extension of the tonsil.  All this cavity was nicely opened.  It  was then irrigated with saline and seemed to be clean and good  hemostasis.  The left side was opened as well and the peritonsillar area  with electrocautery, opened with a tonsillar hemostat.  There was small  amount of pus expressed.  The area was irrigated as well.  The patient  was then released with Crowe-Davis and removed.  Awakened and brought to  recovery room in stable condition.  Counts were correct.           ______________________________  Suzanna Obey, M.D.     JB/MEDQ  D:  04/30/2009  T:  05/01/2009  Job:  [id number]

## 2011-03-30 NOTE — Op Note (Signed)
[facility]  Patient:    Dawn King, Dawn King Visit Number: [id number] MRN: [id number]          Service Type: DSU Location: [facility] Attending Physician:  Gustavus Messing Dictated by:   Yaakov Guthrie. Shon Hough, M.D. Admit Date:  10/27/2001                             Operative Report  PROCEDURE:  Exploration of the right breast.  INDICATION FOR PROCEDURE:  This is a 20 year old who sustained a horse bite to her right breast several weeks ago, causing increased pain, discoloration, and swelling.  She was referred to me from her pediatrician because of increased swelling of the area.  The patient had been treated as an outpatient in the office setting with aspirations of the breast x 2 with serosanguineous-type material consistent with possible hematoma and early seroma formation. Previous cultures have been done showing no growth of the area, but the swelling has been very persistent since that period of time.  Areas of horse bite involving the upper inner chest as well as the nipple area have healed, but the right breast still seems to be almost twice the size of the left breast.  We have allowed the areas to reduce in size over the last few weeks, but it seems to be at a standstill.  I discussed the findings with the mother, and the mother is very anxious to allow the breast to be the same size.  I feel at this time exploration of the right breast is indicated.  FINDINGS:  Increased loculated space, right upper inner breast area, that was freed up, deloculated, and released of seromatous-type fluid.  SURGEON:  Yaakov Guthrie. Shon Hough, M.D.  ANESTHESIA:  General.  DESCRIPTION OF PROCEDURE:  The patient underwent general anesthesia and intubated orally.  Prep was done to the chest and breast areas in a routine fashion using Betadine soap and solution, walled off with sterile towels and drapes so as to make a sterile field.  Periareolar incision was  made, carried down through the skin and subcutaneous tissue and underlying breast tissue. We dissected toward the 6 oclock position first and then up cephalad under the breast tissue to disallow [place] from disrupting the ductal systems of the nipple.  We made a space behind the breast tissue and the fascia of the pectoralis major and in the upper inner quadrant area there was a large loculation pocket that measured approximately 3 x 4 cm.  Using light retraction we were able to open this up, relieving the breast tissue of 30 or 40 cc of seromatous-type fluid, which had no odor.  Cultures were taken as well as tissue culture of the space and the sac.  The breast space was then irrigated with copious amounts of bug juice.  After proper hemostasis, the wound then was closed in layers with 3-0 Monocryl x 2 layers and a running subcuticular stitch of 3-0 Monocryl.  The wounds were drained with a #7 Blake drain, fully-fluted and flat, placed in the lateral portion of the incision and secured with the same 3-0 Monocryl suture.  She withstood the procedures very well, was taken to recovery in good condition.  There was no other evidence of any masses or other abnormalities, AV malformations, etc., in the right breast. Dictated by:   Yaakov Guthrie. Shon Hough, M.D. Attending Physician:  Gustavus Messing DD:  10/27/01 TD:  10/27/01 Job: [id number]

## 2012-01-03 ENCOUNTER — Encounter (HOSPITAL_COMMUNITY): Payer: Self-pay | Admitting: Emergency Medicine

## 2012-01-03 ENCOUNTER — Emergency Department (HOSPITAL_COMMUNITY): Payer: BC Managed Care – PPO

## 2012-01-03 ENCOUNTER — Emergency Department (HOSPITAL_COMMUNITY)
Admission: EM | Admit: 2012-01-03 | Discharge: 2012-01-03 | Disposition: A | Discharge status: Home Health | Payer: BC Managed Care – PPO | Attending: Emergency Medicine | Admitting: Emergency Medicine

## 2012-01-03 DIAGNOSIS — F411 Generalized anxiety disorder: Secondary | ICD-10-CM | POA: Insufficient documentation

## 2012-01-03 DIAGNOSIS — R0602 Shortness of breath: Secondary | ICD-10-CM | POA: Insufficient documentation

## 2012-01-03 DIAGNOSIS — R059 Cough, unspecified: Secondary | ICD-10-CM | POA: Insufficient documentation

## 2012-01-03 DIAGNOSIS — R05 Cough: Secondary | ICD-10-CM | POA: Insufficient documentation

## 2012-01-03 DIAGNOSIS — R0989 Other specified symptoms and signs involving the circulatory and respiratory systems: Secondary | ICD-10-CM | POA: Insufficient documentation

## 2012-01-03 DIAGNOSIS — J189 Pneumonia, unspecified organism: Secondary | ICD-10-CM | POA: Insufficient documentation

## 2012-01-03 DIAGNOSIS — Z79899 Other long term (current) drug therapy: Secondary | ICD-10-CM | POA: Insufficient documentation

## 2012-01-03 HISTORY — DX: Pneumonia, unspecified organism: J18.9

## 2012-01-03 HISTORY — DX: Anxiety disorder, unspecified: F41.9

## 2012-01-03 LAB — CBC
HCT: 34.7 % — ABNORMAL LOW (ref 36.0–46.0)
Hemoglobin: 11.3 g/dL — ABNORMAL LOW (ref 12.0–15.0)
MCH: 23.7 pg — ABNORMAL LOW (ref 26.0–34.0)
MCHC: 32.6 g/dL (ref 30.0–36.0)
MCV: 72.9 fL — ABNORMAL LOW (ref 78.0–100.0)
Platelets: 253 10*3/uL (ref 150–400)
RBC: 4.76 MIL/uL (ref 3.87–5.11)
RDW: 15.4 % (ref 11.5–15.5)
WBC: 6.1 10*3/uL (ref 4.0–10.5)

## 2012-01-03 LAB — POCT I-STAT, CHEM 8
BUN: 6 mg/dL (ref 6–23)
Calcium, Ion: 1.12 mmol/L (ref 1.12–1.32)
Chloride: 103 mEq/L (ref 96–112)
Creatinine, Ser: 0.7 mg/dL (ref 0.50–1.10)
Glucose, Bld: 112 mg/dL — ABNORMAL HIGH (ref 70–99)
HCT: 35 % — ABNORMAL LOW (ref 36.0–46.0)
Hemoglobin: 11.9 g/dL — ABNORMAL LOW (ref 12.0–15.0)
Potassium: 3.4 mEq/L — ABNORMAL LOW (ref 3.5–5.1)
Sodium: 141 mEq/L (ref 135–145)
TCO2: 25 mmol/L (ref 0–100)

## 2012-01-03 LAB — DIFFERENTIAL
Basophils Absolute: 0 10*3/uL (ref 0.0–0.1)
Basophils Relative: 0 % (ref 0–1)
Eosinophils Absolute: 0.2 10*3/uL (ref 0.0–0.7)
Eosinophils Relative: 4 % (ref 0–5)
Lymphocytes Relative: 29 % (ref 12–46)
Lymphs Abs: 1.8 10*3/uL (ref 0.7–4.0)
Monocytes Absolute: 0.5 10*3/uL (ref 0.1–1.0)
Monocytes Relative: 9 % (ref 3–12)
Neutro Abs: 3.6 10*3/uL (ref 1.7–7.7)
Neutrophils Relative %: 58 % (ref 43–77)
WBC Morphology: INCREASED

## 2012-01-03 MED ORDER — AZITHROMYCIN 250 MG PO TABS
500.0000 mg | ORAL_TABLET | Freq: Once | ORAL | Status: AC
  Administered 2012-01-03: 500 mg via ORAL
  Filled 2012-01-03: qty 2

## 2012-01-03 MED ORDER — ALBUTEROL SULFATE (5 MG/ML) 0.5% IN NEBU
2.5000 mg | INHALATION_SOLUTION | Freq: Once | RESPIRATORY_TRACT | Status: AC
  Administered 2012-01-03: 2.5 mg via RESPIRATORY_TRACT
  Filled 2012-01-03: qty 1

## 2012-01-03 MED ORDER — SODIUM CHLORIDE 0.9 % IV BOLUS (SEPSIS)
1000.0000 mL | Freq: Once | INTRAVENOUS | Status: AC
  Administered 2012-01-03: 1000 mL via INTRAVENOUS

## 2012-01-03 MED ORDER — AZITHROMYCIN 250 MG PO TABS: 250.0000 mg | ORAL_TABLET | Freq: Every day | ORAL | Status: AC

## 2012-01-03 MED ORDER — HYDROCODONE-ACETAMINOPHEN 7.5-500 MG/15ML PO SOLN: 15.0000 mL | Freq: Four times a day (QID) | ORAL | Status: AC | PRN

## 2012-01-03 MED ORDER — ALBUTEROL SULFATE HFA 108 (90 BASE) MCG/ACT IN AERS
2.0000 | INHALATION_SPRAY | RESPIRATORY_TRACT | Status: DC
  Administered 2012-01-03: 2 via RESPIRATORY_TRACT
  Filled 2012-01-03: qty 6.7

## 2012-01-03 NOTE — ED Notes (Signed)
Pt states cough for past few days and saw her doctor 2 days ago for same. States sob started last night, states her doctor gave her hydrocodone and helped the cough.

## 2012-01-03 NOTE — ED Provider Notes (Signed)
Medical screening examination/treatment/procedure(s) were conducted as a shared visit with non-physician practitioner(s) and myself.  I personally evaluated the patient during the encounter SHE is a 21 year old, female, who does not smoke, smoke, and does not have asthma, who presents emergency department complaining of a productive cough, fevers, chills, and pain across her lower back.  She has no significant past medical history.  On the left-hand side.  She sat rales in the lower lobes.  Tests, x-ray, shows left-sided pneumonia.  She is not hypoxic or in respiratory distress.  We will treat her with antibiotics as an outpatient.  Nicholes Stairs, MD 01/03/12 [phone]

## 2012-01-03 NOTE — Discharge Instructions (Signed)
YOU CAN BE DISCHARGED HOME AND SHOULD FOLLOW UP WITH YOUR DOCTOR IN 2-3 DAYS FOR RECHECK. IF SYMPTOMS WORSEN RETURN HERE FOR FURTHER EVALUATION. TAKE ZITHROMAX AS DIRECTED (NEXT DOSE DUE TOMORROW), USE INHALER AS NEEDED FOR COUGH AND/OR SHORTNESS OF BREATH. RECOMMEND OVER-THE-COUNTER COUGH MEDICATION AUGMENTED BY HYDROCODONE ELIXIR FOR COUGH AND DISCOMFORT IN BACK AND CHEST.   Pneumonia, Adult Pneumonia is an infection of the lungs.  CAUSES Pneumonia may be caused by bacteria or a virus. Usually, these infections are caused by breathing infectious particles into the lungs (respiratory tract). SYMPTOMS   Cough.   Fever.   Chest pain.   Increased rate of breathing.   Wheezing.   Mucus production.  DIAGNOSIS  If you have the common symptoms of pneumonia, your caregiver will typically confirm the diagnosis with a chest X-ray. The X-ray will show an abnormality in the lung (pulmonary infiltrate) if you have pneumonia. Other tests of your blood, urine, or sputum may be done to find the specific cause of your pneumonia. Your caregiver may also do tests (blood gases or pulse oximetry) to see how well your lungs are working. TREATMENT  Some forms of pneumonia may be spread to other people when you cough or sneeze. You may be asked to wear a mask before and during your exam. Pneumonia that is caused by bacteria is treated with antibiotic medicine. Pneumonia that is caused by the influenza virus may be treated with an antiviral medicine. Most other viral infections must run their course. These infections will not respond to antibiotics.  PREVENTION A pneumococcal shot (vaccine) is available to prevent a common bacterial cause of pneumonia. This is usually suggested for:  People over 1 years old.   Patients on chemotherapy.   People with chronic lung problems, such as bronchitis or emphysema.   People with immune system problems.  If you are over 65 or have a high risk condition, you may  receive the pneumococcal vaccine if you have not received it before. In some countries, a routine influenza vaccine is also recommended. This vaccine can help prevent some cases of pneumonia.You may be offered the influenza vaccine as part of your care. If you smoke, it is time to quit. You may receive instructions on how to stop smoking. Your caregiver can provide medicines and counseling to help you quit. HOME CARE INSTRUCTIONS   Cough suppressants may be used if you are losing too much rest. However, coughing protects you by clearing your lungs. You should avoid using cough suppressants if you can.   Your caregiver may have prescribed medicine if he or she thinks your pneumonia is caused by a bacteria or influenza. Finish your medicine even if you start to feel better.   Your caregiver may also prescribe an expectorant. This loosens the mucus to be coughed up.   Only take over-the-counter or prescription medicines for pain, discomfort, or fever as directed by your caregiver.   Do not smoke. Smoking is a common cause of bronchitis and can contribute to pneumonia. If you are a smoker and continue to smoke, your cough may last several weeks after your pneumonia has cleared.   A cold steam vaporizer or humidifier in your room or home may help loosen mucus.   Coughing is often worse at night. Sleeping in a semi-upright position in a recliner or using a couple pillows under your head will help with this.   Get rest as you feel it is needed. Your body will usually let you know  when you need to rest.  SEEK IMMEDIATE MEDICAL CARE IF:   Your illness becomes worse. This is especially true if you are elderly or weakened from any other disease.   You cannot control your cough with suppressants and are losing sleep.   You begin coughing up blood.   You develop pain which is getting worse or is uncontrolled with medicines.   You have a fever.   Any of the symptoms which initially brought you in  for treatment are getting worse rather than better.   You develop shortness of breath or chest pain.  MAKE SURE YOU:   Understand these instructions.   Will watch your condition.   Will get help right away if you are not doing well or get worse.  Document Released: 10/29/2005 Document Revised: 07/11/2011 Document Reviewed: 01/18/2011 [facility] Patient Information 2012 [place], [place].

## 2012-01-03 NOTE — ED Provider Notes (Signed)
Medical screening examination/treatment/procedure(s) were performed by non-physician practitioner and as supervising physician I was immediately available for consultation/collaboration.  Nicholes Stairs, MD 01/03/12 [phone]

## 2012-01-03 NOTE — ED Provider Notes (Signed)
History     CSN: [id number]  Arrival date & time 01/03/12  4098   First MD Initiated Contact with Patient 01/03/12 [phone]      Chief Complaint  Patient presents with  . Shortness of Breath    pt to ed with c/o shortness of breath since last night, pt states cough for 3 days. saw her doctcor 2 days ago and tested negative for flu and mono and had negative chest xray. states not feeling any better.    (Consider location/radiation/quality/duration/timing/severity/associated sxs/prior treatment) Patient is a 21 y.o. female presenting with shortness of breath. The history is provided by the patient and a parent.  Shortness of Breath  The current episode started 3 to 5 days ago. Episode frequency: She started with cough and URI symptoms of mild congestion 2-3 days ago that was diagnosed as viral illness 2 days ago by her physician.  The problem has been gradually worsening (Since being seen by her doctor she reports worsening cough from nonproductive to productive, has chest and back pain associated with cough and feels SOB is worse.). The problem is moderate. Associated symptoms include shortness of breath. There were no sick contacts. Recently, medical care has been given by the PCP.    Past Medical History  Diagnosis Date  . Pneumonia   . Anxiety     History reviewed. No pertinent past surgical history.  History reviewed. No pertinent family history.  History  Substance Use Topics  . Smoking status: Never Smoker   . Smokeless tobacco: Not on file  . Alcohol Use: No    OB History    Grav Para Term Preterm Abortions TAB SAB Ect Mult Living                  Review of Systems  Respiratory: Positive for shortness of breath.     Allergies  Cefaclor; Sulfamethoxazole w/trimethoprim; Sulfonamide derivatives; and Codeine  Home Medications   Current Outpatient Rx  Name Route Sig Dispense Refill  . VITAMIN D 1000 UNITS PO TABS Oral Take 2,000 Units by mouth daily.    Marland Kitchen  HYDROCODONE-ACETAMINOPHEN 5-325 MG PO TABS Oral Take 1-2 tablets by mouth every 4 (four) hours as needed. For pain    . IBUPROFEN 200 MG PO TABS Oral Take 400 mg by mouth every 8 (eight) hours as needed. For pain    . NORGESTIM-ETH ESTRAD TRIPHASIC 0.18/0.215/0.25 MG-25 MCG PO TABS Oral Take 1 tablet by mouth daily.    Marland Kitchen OVER THE COUNTER MEDICATION Oral Take 1 capsule by mouth daily. probiotic    . SERTRALINE HCL 100 MG PO TABS Oral Take 200 mg by mouth daily.       BP 110/54  Pulse 81  Temp(Src) 98.4 F (36.9 C) (Oral)  Resp 18  SpO2 94%  LMP 12/16/2011  Physical Exam  Constitutional: She appears well-developed and well-nourished.  HENT:  Head: Normocephalic.  Neck: Normal range of motion. Neck supple.  Cardiovascular: Normal rate and regular rhythm.   Pulmonary/Chest: Effort normal. She has rales. She exhibits no tenderness.  Abdominal: Soft. Bowel sounds are normal. There is no tenderness. There is no rebound and no guarding.  Musculoskeletal: Normal range of motion.  Neurological: She is alert. No cranial nerve deficit.  Skin: Skin is warm and dry. No rash noted.  Psychiatric: She has a normal mood and affect.    ED Course  Procedures (including critical care time) She presented intially hypoxic to upper 80's which improved with one nebulizer  treatment. She feels better, less SOB. Pain continues. PNA on x-ray - patient started on Zithromax, given inhaler, antitussive and feel she can be discharged home. Labs Reviewed  CBC - Abnormal; Notable for the following:    Hemoglobin 11.3 (*)    HCT 34.7 (*)    MCV 72.9 (*)    MCH 23.7 (*)    All other components within normal limits  POCT I-STAT, CHEM 8 - Abnormal; Notable for the following:    Potassium 3.4 (*)    Glucose, Bld 112 (*)    Hemoglobin 11.9 (*)    HCT 35.0 (*)    All other components within normal limits  DIFFERENTIAL   Dg Chest 2 View  01/03/2012  *RADIOLOGY REPORT*  Clinical Data: Cough, shortness of breath   CHEST - 2 VIEW  Comparison: 09/28/2010, 09/27/2010  Findings: Patchy nodular airspace process in the left mid and lower lung compatible with pneumonia.  Band-like opacity in the right hilar region, suspicious for associated atelectasis.  No effusion or pneumothorax.  Trachea midline.  IMPRESSION: New patchy nodular airspace process left lung compatible with pneumonia predominately in the lower lobe.  Right perihilar atelectasis  Recommend radiographic follow-up to document complete resolution  Original Report Authenticated By: Judie Petit. Ruel Favors, M.D.     No diagnosis found.    MDM          Rodena Medin, PA-C 01/03/12 1045

## 2013-09-15 ENCOUNTER — Emergency Department (HOSPITAL_COMMUNITY)
Admission: EM | Admit: 2013-09-15 | Discharge: 2013-09-15 | Disposition: A | Discharge status: Home / Self Care | Payer: 59 | Attending: Emergency Medicine | Admitting: Emergency Medicine

## 2013-09-15 ENCOUNTER — Encounter (HOSPITAL_COMMUNITY): Payer: Self-pay | Admitting: Emergency Medicine

## 2013-09-15 ENCOUNTER — Emergency Department (HOSPITAL_COMMUNITY): Payer: 59

## 2013-09-15 DIAGNOSIS — R5381 Other malaise: Secondary | ICD-10-CM | POA: Insufficient documentation

## 2013-09-15 DIAGNOSIS — R42 Dizziness and giddiness: Secondary | ICD-10-CM | POA: Insufficient documentation

## 2013-09-15 DIAGNOSIS — Z87828 Personal history of other (healed) physical injury and trauma: Secondary | ICD-10-CM | POA: Insufficient documentation

## 2013-09-15 DIAGNOSIS — F411 Generalized anxiety disorder: Secondary | ICD-10-CM | POA: Insufficient documentation

## 2013-09-15 DIAGNOSIS — J189 Pneumonia, unspecified organism: Secondary | ICD-10-CM

## 2013-09-15 DIAGNOSIS — J159 Unspecified bacterial pneumonia: Secondary | ICD-10-CM | POA: Insufficient documentation

## 2013-09-15 DIAGNOSIS — IMO0002 Reserved for concepts with insufficient information to code with codable children: Secondary | ICD-10-CM | POA: Insufficient documentation

## 2013-09-15 DIAGNOSIS — R5383 Other fatigue: Secondary | ICD-10-CM | POA: Insufficient documentation

## 2013-09-15 DIAGNOSIS — R630 Anorexia: Secondary | ICD-10-CM | POA: Insufficient documentation

## 2013-09-15 DIAGNOSIS — Z79899 Other long term (current) drug therapy: Secondary | ICD-10-CM | POA: Insufficient documentation

## 2013-09-15 HISTORY — DX: Personal history of traumatic brain injury: Z87.820

## 2013-09-15 MED ORDER — SODIUM CHLORIDE 0.9 % IV BOLUS (SEPSIS)
1000.0000 mL | Freq: Once | INTRAVENOUS | Status: AC
  Administered 2013-09-15: 1000 mL via INTRAVENOUS

## 2013-09-15 MED ORDER — DOXYCYCLINE HYCLATE 100 MG PO CAPS: 100.0000 mg | ORAL_CAPSULE | Freq: Two times a day (BID) | ORAL | Status: DC

## 2013-09-15 MED ORDER — ALBUTEROL SULFATE (5 MG/ML) 0.5% IN NEBU
5.0000 mg | INHALATION_SOLUTION | Freq: Once | RESPIRATORY_TRACT | Status: AC
  Administered 2013-09-15: 5 mg via RESPIRATORY_TRACT
  Filled 2013-09-15: qty 1

## 2013-09-15 MED ORDER — GUAIFENESIN ER 1200 MG PO TB12: 1.0000 | ORAL_TABLET | Freq: Two times a day (BID) | ORAL | Status: DC

## 2013-09-15 MED ORDER — HYDROCODONE-ACETAMINOPHEN 5-325 MG PO TABS: 1.0000 | ORAL_TABLET | Freq: Four times a day (QID) | ORAL | Status: DC | PRN

## 2013-09-15 MED ORDER — HYDROCODONE-ACETAMINOPHEN 5-325 MG PO TABS
1.0000 | ORAL_TABLET | Freq: Once | ORAL | Status: AC
  Administered 2013-09-15: 1 via ORAL
  Filled 2013-09-15: qty 1

## 2013-09-15 NOTE — ED Provider Notes (Signed)
Medical screening examination/treatment/procedure(s) were conducted as a shared visit with non-physician practitioner(s) or resident and myself. I personally evaluated the patient during the encounter and agree with the findings and plan unless otherwise indicated.  I have personally reviewed any xrays and/ or EKG's with the provider and I agree with interpretation.  Worsening cough/ sob since the wkd. Rales on exam, no distress, tolerating po. CXR reviewed, likely pneumonia. Plan for abx and close outpt fup.  Community Pneumonia, Cough   Enid Skeens, MD 09/15/13 1726

## 2013-09-15 NOTE — ED Provider Notes (Signed)
CSN: [id number]     Arrival date & time 09/15/13  4098 History   First MD Initiated Contact with Patient 09/15/13 0751     Chief Complaint  Patient presents with  . Cough  . Shortness of Breath  . Chest Pain   HPI  [name] is a 22 yo female with a history of PNA who complains of increased SOB, chest pain, and cough since this weekend. She went to [facility] on Saturday and was dx with an ear infection. Was told she had 'wheezing' in her chest and sent home with an inhaler. No CXR or breathing tx at that time. Dawn King has had increased cough and SOB since Saturday.Inhaler has not helped. Cough is productive of yellow sputum. Endorses dizziness with position changes or coughing at times. She has not passed out or fallen. Since last night, patient has developed chest pain that is sharp and shooting in the middle of her chest that radiates towards her back. Pain is worse with deep breaths or coughs. Generally feels very tired and weak with decreased appetite. No nausea, vomiting, diarrhea, constipation, fever, chills, or mylagias. Ear pain and nasal congestion are improving. Patient states that this is what her previous episodes of PNA have felt like.   Past Medical History  Diagnosis Date  . Pneumonia   . Anxiety   . H/O multiple concussions    Past Surgical History  Procedure Laterality Date  . Tonsillectomy    . Adenoidectomy     No family history on file. History  Substance Use Topics  . Smoking status: Never Smoker   . Smokeless tobacco: Not on file  . Alcohol Use: No   OB History   Grav Para Term Preterm Abortions TAB SAB Ect Mult Living                 Review of Systems  Allergies  Cefaclor; Hydroxyzine; Septra; Sulfamethoxazole-trimethoprim; Sulfonamide derivatives; Codeine; Levaquin; and Zofran  Home Medications   Current Outpatient Rx  Name  Route  Sig  Dispense  Refill  . azithromycin (ZITHROMAX Z-PAK) 250 MG tablet   Oral   Take 250 mg by mouth daily. For  5  days. On day 3 of therapy         . budesonide-formoterol (SYMBICORT) 160-4.5 MCG/ACT inhaler   Inhalation   Inhale 2 puffs into the lungs 2 (two) times daily.         . chlorpheniramine-HYDROcodone (TUSSIONEX PENNKINETIC ER) 10-8 MG/5ML LQCR   Oral   Take 5 mLs by mouth every 12 (twelve) hours as needed (cough).         . cholecalciferol (VITAMIN D) 1000 UNITS tablet   Oral   Take 2,000 Units by mouth daily.         Marland Kitchen HYDROcodone-acetaminophen (NORCO) 5-325 MG per tablet   Oral   Take 1-2 tablets by mouth every 4 (four) hours as needed. For pain         . ibuprofen (ADVIL,MOTRIN) 200 MG tablet   Oral   Take 400 mg by mouth every 8 (eight) hours as needed. For pain         . Norgestimate-Ethinyl Estradiol Triphasic (ORTHO TRI-CYCLEN LO) 0.18/0.215/0.25 MG-25 MCG tablet   Oral   Take 1 tablet by mouth daily.         Marland Kitchen OVER THE COUNTER MEDICATION   Oral   Take 1 capsule by mouth daily. probiotic         . sertraline (  ZOLOFT) 100 MG tablet   Oral   Take 200 mg by mouth daily.           BP 123/72  Pulse 88  Temp(Src) 98 F (36.7 C) (Oral)  Resp 20  SpO2 93%  LMP 09/01/2013 Physical Exam  Constitutional: She is oriented to person, place, and time. She appears well-developed and well-nourished. No distress.  HENT:  Nose: Rhinorrhea present. Right sinus exhibits no maxillary sinus tenderness and no frontal sinus tenderness. Left sinus exhibits no maxillary sinus tenderness and no frontal sinus tenderness.  Mouth/Throat: Uvula is midline. Mucous membranes are not dry. No oropharyngeal exudate, posterior oropharyngeal edema, posterior oropharyngeal erythema or tonsillar abscesses.  Cardiovascular: Normal rate, regular rhythm, S1 normal and S2 normal.   Pulmonary/Chest: No accessory muscle usage. Not tachypneic. No respiratory distress. She has decreased breath sounds.   She exhibits tenderness (along sternum).  Tenderness to palpation along right scapular  boarder  Lymphadenopathy:    She has no cervical adenopathy.  Neurological: She is alert and oriented to person, place, and time.  Skin: She is not diaphoretic.  Psychiatric: She has a normal mood and affect.    ED Course  Procedures (including critical care time)   Patient be treated for community-acquired pneumonia.  Patient is advised to return here as needed.  Also advised her follow up with her primary care Dr. for recheck.  Told to increase her fluid intake.  Patient has had normal vital signs here in the emergency department.  Patient was given a breathing treatment    Carlyle Dolly, PA-C 09/15/13 1022

## 2013-09-15 NOTE — ED Notes (Signed)
Pt went to [facility] on Saturday and was dx with and ear infection and "wheezing". Pt is now coughing up yellow sputum and c/o SOB and sharp CP across the center of her chest.

## 2015-09-19 ENCOUNTER — Other Ambulatory Visit: Payer: Self-pay | Admitting: *Deleted

## 2015-09-19 DIAGNOSIS — R519 Headache, unspecified: Secondary | ICD-10-CM

## 2015-09-19 DIAGNOSIS — R51 Headache: Principal | ICD-10-CM

## 2015-09-23 ENCOUNTER — Ambulatory Visit (HOSPITAL_COMMUNITY): Payer: BLUE CROSS/BLUE SHIELD

## 2016-02-10 ENCOUNTER — Emergency Department (HOSPITAL_COMMUNITY)
Admission: EM | Admit: 2016-02-10 | Discharge: 2016-02-10 | Disposition: A | Discharge status: Home / Self Care | Payer: BLUE CROSS/BLUE SHIELD | Source: Home / Self Care | Attending: Emergency Medicine | Admitting: Emergency Medicine

## 2016-02-10 ENCOUNTER — Encounter (HOSPITAL_COMMUNITY): Payer: Self-pay | Admitting: Emergency Medicine

## 2016-02-10 DIAGNOSIS — R21 Rash and other nonspecific skin eruption: Secondary | ICD-10-CM

## 2016-02-10 DIAGNOSIS — Z889 Allergy status to unspecified drugs, medicaments and biological substances status: Secondary | ICD-10-CM

## 2016-02-10 DIAGNOSIS — T7840XA Allergy, unspecified, initial encounter: Secondary | ICD-10-CM

## 2016-02-10 MED ORDER — METHYLPREDNISOLONE SODIUM SUCC 125 MG IJ SOLR
125.0000 mg | Freq: Once | INTRAMUSCULAR | Status: AC
  Administered 2016-02-10: 125 mg via INTRAMUSCULAR

## 2016-02-10 MED ORDER — PREDNISONE 10 MG PO TABS: ORAL_TABLET | ORAL | Status: DC

## 2016-02-10 MED ORDER — METHYLPREDNISOLONE SODIUM SUCC 125 MG IJ SOLR
INTRAMUSCULAR | Status: AC
  Filled 2016-02-10: qty 2

## 2016-02-10 NOTE — ED Notes (Signed)
C/o rash all over body onset x3 days.... Reports she started clindamycin due to dental work done A&O x4... No acute distress.

## 2016-02-10 NOTE — ED Provider Notes (Signed)
CSN: [id number]     Arrival date & time 02/10/16  1511 History   First MD Initiated Contact with Patient 02/10/16 1729     Chief Complaint  Patient presents with  . Allergic Reaction   (Consider location/radiation/quality/duration/timing/severity/associated sxs/prior Treatment) HPI She is a 25 year old woman here for evaluation of rash. She states the rash started on Wednesday. It is located on her face, arms, legs, back, and torso. It is itchy. She states it is now starting to hurt a little bit as well. It is been getting progressively more red over the last 2 days. She denies any throat swelling or difficulty breathing. She does report feeling a little lightheaded this morning with temporarily hazy vision. She had a dental operation on Tuesday and received a dose of clindamycin preop. Postoperatively, she was placed on amoxicillin and Vicodin, which she states she took without difficulty 1 month ago. She has been taking Benadryl without improvement.  Past Medical History  Diagnosis Date  . Pneumonia   . Anxiety   . H/O multiple concussions    Past Surgical History  Procedure Laterality Date  . Tonsillectomy    . Adenoidectomy     No family history on file. Social History  Substance Use Topics  . Smoking status: Never Smoker   . Smokeless tobacco: None  . Alcohol Use: No   OB History    No data available     Review of Systems As in history of present illness Allergies  Cefaclor; Hydroxyzine; Septra; Sulfamethoxazole-trimethoprim; Sulfonamide derivatives; Clindamycin/lincomycin; Codeine; Levaquin; and Zofran  Home Medications   Prior to Admission medications   Medication Sig Start Date End Date Taking? Authorizing Provider  HYDROcodone-acetaminophen (NORCO/VICODIN) 5-325 MG per tablet Take 1 tablet by mouth every 6 (six) hours as needed for moderate pain. 09/15/13  Yes Christopher Lawyer, PA-C  ibuprofen (ADVIL,MOTRIN) 200 MG tablet Take 400 mg by mouth every 8 (eight) hours  as needed. For pain   Yes Historical Provider, MD  lamoTRIgine (LAMICTAL) 100 MG tablet Take 100 mg by mouth daily.   Yes Historical Provider, MD  Norgestimate-Ethinyl Estradiol Triphasic (ORTHO TRI-CYCLEN LO) 0.18/0.215/0.25 MG-25 MCG tablet Take 1 tablet by mouth daily.   Yes Historical Provider, MD  sertraline (ZOLOFT) 100 MG tablet Take 200 mg by mouth daily.    Yes Historical Provider, MD  azithromycin (ZITHROMAX Z-PAK) 250 MG tablet Take 250 mg by mouth daily. For  5 days. On day 3 of therapy    Historical Provider, MD  budesonide-formoterol (SYMBICORT) 160-4.5 MCG/ACT inhaler Inhale 2 puffs into the lungs 2 (two) times daily.    Historical Provider, MD  chlorpheniramine-HYDROcodone (TUSSIONEX PENNKINETIC ER) 10-8 MG/5ML LQCR Take 5 mLs by mouth every 12 (twelve) hours as needed (cough).    Historical Provider, MD  cholecalciferol (VITAMIN D) 1000 UNITS tablet Take 2,000 Units by mouth daily.    Historical Provider, MD  doxycycline (VIBRAMYCIN) 100 MG capsule Take 1 capsule (100 mg total) by mouth 2 (two) times daily. 09/15/13   Christopher Lawyer, PA-C  Guaifenesin 1200 MG TB12 Take 1 tablet (1,200 mg total) by mouth 2 (two) times daily. 09/15/13   Charlestine Nighthristopher Lawyer, PA-C  HYDROcodone-acetaminophen (NORCO) 5-325 MG per tablet Take 1-2 tablets by mouth every 4 (four) hours as needed. For pain    Historical Provider, MD  OVER THE COUNTER MEDICATION Take 1 capsule by mouth daily. probiotic    Historical Provider, MD  predniSONE (DELTASONE) 10 MG tablet Take 6 tablets on day 1, 5 on  day 4, 4 on day 3, 3 on day 4, 2 on day 5, 1 on day 6 02/10/16   Charm Rings, MD   Meds Ordered and Administered this Visit   Medications  methylPREDNISolone sodium succinate (SOLU-MEDROL) 125 mg/2 mL injection 125 mg (not administered)    BP 142/88 mmHg  Pulse 88  Temp(Src) 99 F (37.2 C) (Oral)  Resp 20  SpO2 97%  LMP 01/24/2016 No data found.   Physical Exam  Constitutional: She is oriented to person,  place, and time. She appears well-developed and well-nourished. No distress.  HENT:  Mouth/Throat: Oropharynx is clear and moist. No oropharyngeal exudate.  No oropharyngeal swelling.  Neck: Neck supple.  Cardiovascular: Normal rate, regular rhythm and normal heart sounds.   No murmur heard. Pulmonary/Chest: Effort normal and breath sounds normal. No respiratory distress. She has no wheezes. She has no rales.  Neurological: She is alert and oriented to person, place, and time.  Skin: Rash (diffuse blanching erythematous maculopapular rash that spares palms and soles.) noted.    ED Course  Procedures (including critical care time)  Labs Review Labs Reviewed - No data to display  Imaging Review No results found.   MDM   1. Allergic reaction caused by a drug    Likely reaction to clindamycin. Solu-Medrol given here. Prednisone taper and OTC allergy medicine. Return precautions reviewed.    Charm Rings, MD 02/10/16 [phone]

## 2016-02-10 NOTE — Discharge Instructions (Signed)
You are likely having an allergic reaction to the clindamycin. We gave you a steroid shot today to help. Start the prednisone taper tomorrow. Take a daily allergy pill, such as Zyrtec, Claritin, or Allegra, for the next week. Use Benadryl as needed for itching. If you develop difficulty breathing or problems with your throat, please go to the emergency room.

## 2016-06-14 ENCOUNTER — Emergency Department (HOSPITAL_COMMUNITY)
Admission: EM | Admit: 2016-06-14 | Discharge: 2016-06-14 | Disposition: A | Discharge status: Home / Self Care | Payer: BLUE CROSS/BLUE SHIELD | Attending: Emergency Medicine | Admitting: Emergency Medicine

## 2016-06-14 ENCOUNTER — Encounter (HOSPITAL_COMMUNITY): Payer: Self-pay | Admitting: Emergency Medicine

## 2016-06-14 ENCOUNTER — Emergency Department (HOSPITAL_COMMUNITY): Payer: BLUE CROSS/BLUE SHIELD

## 2016-06-14 DIAGNOSIS — Z79899 Other long term (current) drug therapy: Secondary | ICD-10-CM | POA: Insufficient documentation

## 2016-06-14 DIAGNOSIS — S39012A Strain of muscle, fascia and tendon of lower back, initial encounter: Secondary | ICD-10-CM | POA: Diagnosis not present

## 2016-06-14 DIAGNOSIS — S301XXA Contusion of abdominal wall, initial encounter: Secondary | ICD-10-CM | POA: Diagnosis not present

## 2016-06-14 DIAGNOSIS — Y999 Unspecified external cause status: Secondary | ICD-10-CM | POA: Insufficient documentation

## 2016-06-14 DIAGNOSIS — Z791 Long term (current) use of non-steroidal anti-inflammatories (NSAID): Secondary | ICD-10-CM | POA: Diagnosis not present

## 2016-06-14 DIAGNOSIS — M545 Low back pain: Secondary | ICD-10-CM | POA: Diagnosis present

## 2016-06-14 DIAGNOSIS — Y929 Unspecified place or not applicable: Secondary | ICD-10-CM | POA: Diagnosis not present

## 2016-06-14 DIAGNOSIS — S20229A Contusion of unspecified back wall of thorax, initial encounter: Secondary | ICD-10-CM | POA: Diagnosis not present

## 2016-06-14 DIAGNOSIS — R52 Pain, unspecified: Secondary | ICD-10-CM

## 2016-06-14 DIAGNOSIS — Z7982 Long term (current) use of aspirin: Secondary | ICD-10-CM | POA: Diagnosis not present

## 2016-06-14 DIAGNOSIS — Y9389 Activity, other specified: Secondary | ICD-10-CM | POA: Diagnosis not present

## 2016-06-14 DIAGNOSIS — S335XXA Sprain of ligaments of lumbar spine, initial encounter: Secondary | ICD-10-CM

## 2016-06-14 LAB — I-STAT BETA HCG BLOOD, ED (MC, WL, AP ONLY): I-stat hCG, quantitative: <5 m[IU]/mL (ref <5)

## 2016-06-14 MED ORDER — NAPROXEN 500 MG PO TABS: 500.0000 mg | ORAL_TABLET | Freq: Two times a day (BID) | ORAL | 0 refills | Status: DC

## 2016-06-14 MED ORDER — HYDROCODONE-ACETAMINOPHEN 5-325 MG PO TABS: ORAL_TABLET | ORAL | 0 refills | Status: DC

## 2016-06-14 MED ORDER — HYDROMORPHONE HCL 1 MG/ML IJ SOLN
0.5000 mg | Freq: Once | INTRAMUSCULAR | Status: AC
  Administered 2016-06-14: 0.5 mg via INTRAVENOUS
  Filled 2016-06-14: qty 1

## 2016-06-14 MED ORDER — PROMETHAZINE HCL 25 MG PO TABS
25.0000 mg | ORAL_TABLET | Freq: Once | ORAL | Status: AC
Start: 2016-06-14 — End: 2016-06-14
  Administered 2016-06-14: 25 mg via ORAL
  Filled 2016-06-14: qty 1

## 2016-06-14 MED ORDER — METHOCARBAMOL 500 MG PO TABS: 1000.0000 mg | ORAL_TABLET | Freq: Four times a day (QID) | ORAL | 0 refills | Status: DC

## 2016-06-14 MED ORDER — HYDROMORPHONE HCL 1 MG/ML IJ SOLN
0.5000 mg | Freq: Once | INTRAMUSCULAR | Status: AC
Start: 2016-06-14 — End: 2016-06-14
  Administered 2016-06-14: 0.5 mg via INTRAVENOUS
  Filled 2016-06-14: qty 1

## 2016-06-14 NOTE — Discharge Instructions (Signed)
Please read and follow all provided instructions.  Your diagnoses today include:  1. Contusion of flank and back, initial encounter   2. Pain   3. Low back sprain, initial encounter     Tests performed today include: Vital signs - see below for your results today X-ray of your lower spine and pelvis - no fractures  Medications prescribed:  Vicodin (hydrocodone/acetaminophen) - narcotic pain medication  DO NOT drive or perform any activities that require you to be awake and alert because this medicine can make you drowsy. BE VERY CAREFUL not to take multiple medicines containing Tylenol (also called acetaminophen). Doing so can lead to an overdose which can damage your liver and cause liver failure and possibly death.  Naproxen - anti-inflammatory pain medication Do not exceed 500mg  naproxen every 12 hours, take with food  You have been prescribed an anti-inflammatory medication or NSAID. Take with food. Take smallest effective dose for the shortest duration needed for your pain. Stop taking if you experience stomach pain or vomiting.  Robaxin (methocarbamol) - muscle relaxer medication  DO NOT drive or perform any activities that require you to be awake and alert because this medicine can make you drowsy.   Take any prescribed medications only as directed.  Home care instructions:  Follow any educational materials contained in this packet Please rest, use ice or heat on your back for the next several days Do not lift, push, pull anything more than 10 pounds for the next week  Follow-up instructions: Please follow-up with your primary care provider in the next 1 week for further evaluation of your symptoms.   Return instructions:  SEEK IMMEDIATE MEDICAL ATTENTION IF YOU HAVE: New numbness, tingling, weakness, or problem with the use of your arms or legs Worsening severe pain in your abdomen Severe back pain not relieved with medications Loss control of your bowels or  bladder Increasing pain in any areas of the body (such as chest or abdominal pain) Shortness of breath, dizziness, or fainting.  Worsening nausea (feeling sick to your stomach), vomiting, fever, or sweats Any other emergent concerns regarding your health   Additional Information:  Your vital signs today were: BP 125/70    Pulse 70    Temp 97.9 F (36.6 C) (Oral)    Resp 14    Ht 5\' 5"  (1.651 m)    Wt 111.1 kg    LMP 06/10/2016 (Approximate)    SpO2 99%    BMI 40.77 kg/m  If your blood pressure (BP) was elevated above 135/85 this visit, please have this repeated by your doctor within one month. --------------
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
## 2016-06-14 NOTE — ED Notes (Signed)
Pt ambulated to restroom without difficulty

## 2016-06-14 NOTE — ED Notes (Signed)
PA at bedside.

## 2016-06-14 NOTE — ED Provider Notes (Signed)
[facility]-EMERGENCY DEPT Provider Note   CSN: [id number] Arrival date & time: 06/14/16  1110  First Provider Contact:  None       History   Chief Complaint Chief Complaint  Patient presents with  . Fall    HPI Dawn King is a 25 y.o. female.  Patient presents with complaint with left hip, lower back, and lower flank pain after falling from a horse. Incident occurred approximately 9 AM. Patient was able to drive herself home and a family member drove her to the emergency department. Patient mainly complains of pain in her left lower back and hip. She remembers everything and did not hit her head. No neck pain. No chest pain or shortness of breath. No anterior abdominal pain. No vision change, vomiting. Patient has been ambulatory but with pain. She has a history of a liver laceration after being stepped on by horse and states that this pain is not similar. Patient took ibuprofen prior to arrival. Onset of symptoms acute. Course is constant.      Past Medical History:  Diagnosis Date  . Anxiety   . H/O multiple concussions   . Pneumonia     Patient Active Problem List   Diagnosis Date Noted  . MONONUCLEOSIS 05/23/2009  . LEUKOCYTOSIS 05/23/2009  . CARDIAC MURMUR 05/23/2009  . PERITONSILLAR ABSCESS 04/28/2009    Past Surgical History:  Procedure Laterality Date  . ADENOIDECTOMY    . TONSILLECTOMY      OB History    No data available       Home Medications    Prior to Admission medications   Medication Sig Start Date End Date Taking? Authorizing Provider  azithromycin (ZITHROMAX Z-PAK) 250 MG tablet Take 250 mg by mouth daily. For  5 days. On day 3 of therapy    Historical Provider, MD  budesonide-formoterol (SYMBICORT) 160-4.5 MCG/ACT inhaler Inhale 2 puffs into the lungs 2 (two) times daily.    Historical Provider, MD  chlorpheniramine-HYDROcodone (TUSSIONEX PENNKINETIC ER) 10-8 MG/5ML LQCR Take 5 mLs by mouth every 12 (twelve) hours as needed (cough).     Historical Provider, MD  cholecalciferol (VITAMIN D) 1000 UNITS tablet Take 2,000 Units by mouth daily.    Historical Provider, MD  doxycycline (VIBRAMYCIN) 100 MG capsule Take 1 capsule (100 mg total) by mouth 2 (two) times daily. 09/15/13   Christopher Lawyer, PA-C  Guaifenesin 1200 MG TB12 Take 1 tablet (1,200 mg total) by mouth 2 (two) times daily. 09/15/13   Charlestine Night, PA-C  HYDROcodone-acetaminophen (NORCO) 5-325 MG per tablet Take 1-2 tablets by mouth every 4 (four) hours as needed. For pain    Historical Provider, MD  HYDROcodone-acetaminophen (NORCO/VICODIN) 5-325 MG per tablet Take 1 tablet by mouth every 6 (six) hours as needed for moderate pain. 09/15/13   Charlestine Night, PA-C  ibuprofen (ADVIL,MOTRIN) 200 MG tablet Take 400 mg by mouth every 8 (eight) hours as needed. For pain    Historical Provider, MD  lamoTRIgine (LAMICTAL) 100 MG tablet Take 100 mg by mouth daily.    Historical Provider, MD  Norgestimate-Ethinyl Estradiol Triphasic (ORTHO TRI-CYCLEN LO) 0.18/0.215/0.25 MG-25 MCG tablet Take 1 tablet by mouth daily.    Historical Provider, MD  OVER THE COUNTER MEDICATION Take 1 capsule by mouth daily. probiotic    Historical Provider, MD  predniSONE (DELTASONE) 10 MG tablet Take 6 tablets on day 1, 5 on day 4, 4 on day 3, 3 on day 4, 2 on day 5, 1 on day 6 02/10/16  Charm Rings, MD  sertraline (ZOLOFT) 100 MG tablet Take 200 mg by mouth daily.     Historical Provider, MD    Family History No family history on file.  Social History Social History  Substance Use Topics  . Smoking status: Never Smoker  . Smokeless tobacco: Never Used  . Alcohol use No     Allergies   Cefaclor; Hydroxyzine; Septra [sulfamethoxazole-trimethoprim]; Sulfamethoxazole-trimethoprim; Sulfonamide derivatives; Clindamycin/lincomycin; Codeine; Levaquin [levofloxacin]; and Zofran [ondansetron]   Review of Systems Review of Systems  Constitutional: Negative for fatigue.  HENT: Negative for  tinnitus.   Eyes: Negative for photophobia, pain and visual disturbance.  Respiratory: Negative for shortness of breath.   Cardiovascular: Negative for chest pain.  Gastrointestinal: Negative for nausea and vomiting.  Genitourinary: Positive for flank pain. Negative for hematuria.  Musculoskeletal: Positive for back pain and gait problem. Negative for neck pain.  Skin: Negative for wound.  Neurological: Negative for dizziness, weakness, light-headedness, numbness and headaches.  Psychiatric/Behavioral: Negative for confusion and decreased concentration.     Physical Exam Updated Vital Signs BP 130/84 (BP Location: Right Arm)   Pulse 85   Temp 97.9 F (36.6 C) (Oral)   Resp 16   Ht 5\' 5"  (1.651 m)   Wt 111.1 kg   LMP 06/10/2016 (Approximate)   SpO2 99%   BMI 40.77 kg/m   Physical Exam  Constitutional: She is oriented to person, place, and time. She appears well-developed and well-nourished.  HENT:  Head: Normocephalic and atraumatic. Head is without raccoon's eyes and without Battle's sign.  Right Ear: Tympanic membrane, external ear and ear canal normal. No hemotympanum.  Left Ear: Tympanic membrane, external ear and ear canal normal. No hemotympanum.  Nose: Nose normal. No nasal septal hematoma.  Mouth/Throat: Uvula is midline, oropharynx is clear and moist and mucous membranes are normal.  Eyes: Conjunctivae, EOM and lids are normal. Pupils are equal, round, and reactive to light. Right eye exhibits no nystagmus. Left eye exhibits no nystagmus.  No visible hyphema noted  Neck: Normal range of motion. Neck supple.  Cardiovascular: Normal rate and regular rhythm.   Pulmonary/Chest: Effort normal and breath sounds normal.  Abdominal: Soft. There is no tenderness.  Musculoskeletal:       Left hip: She exhibits decreased range of motion, tenderness and bony tenderness (lateral pelvis). She exhibits normal strength.       Left knee: Normal.       Cervical back: She exhibits  normal range of motion, no tenderness and no bony tenderness.       Thoracic back: She exhibits no tenderness and no bony tenderness.       Lumbar back: She exhibits tenderness. She exhibits no bony tenderness.       Back:       Left upper leg: Normal.  Neurological: She is alert and oriented to person, place, and time. She has normal strength and normal reflexes. No cranial nerve deficit or sensory deficit. Coordination normal. GCS eye subscore is 4. GCS verbal subscore is 5. GCS motor subscore is 6.  Skin: Skin is warm and dry.  Psychiatric: She has a normal mood and affect.  Nursing note and vitals reviewed.    ED Treatments / Results  Labs (all labs ordered are listed, but only abnormal results are displayed) Labs Reviewed  I-STAT BETA HCG BLOOD, ED ([facility], [facility], [facility] ONLY)    EKG  EKG Interpretation None       Radiology Dg Lumbar Spine Complete  Result Date: 06/14/2016 CLINICAL DATA:  Patient reports being thrown from horse PTA. Wearing helmet, didn't hit head, no LOC, no anticoagulants. C/o mid lower back pain, LUQ abdominal pain and left hip pain EXAM: LUMBAR SPINE - COMPLETE 4+ VIEW COMPARISON:  CT, 09/27/2010 FINDINGS: No acute fracture. There are bilateral pars defects at L5-S1 with a grade 1 anterolisthesis similar to the prior CT scan. No other spondylolisthesis. Disc spaces and facet joints are well maintained. Soft tissues are unremarkable. IMPRESSION: 1. No acute finding. 2. Bilateral pars defects at L5-S1 with a grade 1 anterolisthesis. Electronically Signed   By: Amie Portland M.D.   On: 06/14/2016 14:03   Dg Pelvis 1-2 Views  Result Date: 06/14/2016 CLINICAL DATA:  Fall from horse. EXAM: PELVIS - 1-2 VIEW COMPARISON:  None. FINDINGS: No pelvic fracture or sacral fracture.  Hips are located. IMPRESSION: No evidence of pelvic fracture. Electronically Signed   By: Genevive Bi M.D.   On: 06/14/2016 14:03    Procedures Procedures (including critical care  time)  Medications Ordered in ED Medications  HYDROmorphone (DILAUDID) injection 0.5 mg (0.5 mg Intravenous Given 06/14/16 1217)  promethazine (PHENERGAN) tablet 25 mg (25 mg Oral Given 06/14/16 1214)  HYDROmorphone (DILAUDID) injection 0.5 mg (0.5 mg Intravenous Given 06/14/16 1443)    Initial Impression / Assessment and Plan / ED Course  I have reviewed the triage vital signs and the nursing notes.  Pertinent labs & imaging results that were available during my care of the patient were reviewed by me and considered in my medical decision making (see chart for details).  Patient seen and examined. Work-up initiated. Medications ordered. No LUQ tenderness. No significant abd pain with palpation. Low suspicion for intraabdominal injury.   Vital signs reviewed and are as follows: BP 127/70   Pulse 73   Temp 97.9 F (36.6 C) (Oral)   Resp 14   Ht 5\' 5"  (1.651 m)   Wt 111.1 kg   LMP 06/10/2016 (Approximate)   SpO2 95%   BMI 40.77 kg/m   3:33 PM Patient reevaluated. She required additional medicine after x-ray. Now improved. She has ambulated in the hallway without difficulty. She does have pain in her hip and lower back with ambulation. Her exam abdominal exam is unchanged without development of significant abdominal pain including in the left upper quadrant. She appears stable. No lower extremity problems. At this time, feel comfortable with discharge to home with conservative measures.  Patient counseled on use of narcotic pain medications. Counseled not to combine these medications with others containing tylenol. Urged not to drink alcohol, drive, or perform any other activities that requires focus while taking these medications. The patient verbalizes understanding and agrees with the plan.  Encouraged to return with worsening pain, vomiting, development of abdominal pain, blood in stool or urine.  Follow-up with PCP as needed.   Final Clinical Impressions(s) / ED Diagnoses   Final  diagnoses:  Contusion of flank and back, initial encounter  Low back sprain, initial encounter   Patient with lateral pelvis, flank, lower back pain after fall from horse. X-ray imaging is negative. Patient has no true functional deficits but has pain with ambulation and palpation. Patient without anterior abdominal pain or left upper quadrant pain or any development of this during emergency department stay. Lower extremities are neurovascularly intact. Pain controlled in ED. Do not feel patient requires advanced imaging given that she is functional, no evolution of symptoms in ED.    New Prescriptions New Prescriptions  HYDROCODONE-ACETAMINOPHEN (NORCO/VICODIN) 5-325 MG TABLET    Take 1-2 tablets every 6 hours as needed for severe pain   METHOCARBAMOL (ROBAXIN) 500 MG TABLET    Take 2 tablets (1,000 mg total) by mouth 4 (four) times daily.   NAPROXEN (NAPROSYN) 500 MG TABLET    Take 1 tablet (500 mg total) by mouth 2 (two) times daily.     Renne Crigler, PA-C 06/14/16 1538    Linwood Dibbles, MD 06/14/16 [phone]

## 2016-06-14 NOTE — ED Notes (Signed)
Patient transported to X-ray 

## 2016-06-21 ENCOUNTER — Emergency Department (HOSPITAL_COMMUNITY): Payer: BLUE CROSS/BLUE SHIELD

## 2016-06-21 ENCOUNTER — Emergency Department (HOSPITAL_COMMUNITY)
Admission: EM | Admit: 2016-06-21 | Discharge: 2016-06-21 | Disposition: A | Discharge status: Home / Self Care | Payer: BLUE CROSS/BLUE SHIELD | Attending: Emergency Medicine | Admitting: Emergency Medicine

## 2016-06-21 ENCOUNTER — Encounter (HOSPITAL_COMMUNITY): Payer: Self-pay | Admitting: Emergency Medicine

## 2016-06-21 DIAGNOSIS — Z79899 Other long term (current) drug therapy: Secondary | ICD-10-CM | POA: Diagnosis not present

## 2016-06-21 DIAGNOSIS — Z791 Long term (current) use of non-steroidal anti-inflammatories (NSAID): Secondary | ICD-10-CM | POA: Insufficient documentation

## 2016-06-21 DIAGNOSIS — N838 Other noninflammatory disorders of ovary, fallopian tube and broad ligament: Secondary | ICD-10-CM

## 2016-06-21 DIAGNOSIS — Z7982 Long term (current) use of aspirin: Secondary | ICD-10-CM | POA: Diagnosis not present

## 2016-06-21 DIAGNOSIS — N83202 Unspecified ovarian cyst, left side: Secondary | ICD-10-CM | POA: Diagnosis not present

## 2016-06-21 DIAGNOSIS — R1012 Left upper quadrant pain: Secondary | ICD-10-CM | POA: Diagnosis present

## 2016-06-21 DIAGNOSIS — N9489 Other specified conditions associated with female genital organs and menstrual cycle: Secondary | ICD-10-CM

## 2016-06-21 LAB — URINALYSIS, ROUTINE W REFLEX MICROSCOPIC
Bilirubin Urine: NEGATIVE
Glucose, UA: NEGATIVE mg/dL
Hgb urine dipstick: NEGATIVE
Ketones, ur: NEGATIVE mg/dL
Leukocytes, UA: NEGATIVE
Nitrite: NEGATIVE
Protein, ur: NEGATIVE mg/dL
Specific Gravity, Urine: 1.013 (ref 1.005–1.030)
pH: 5 (ref 5.0–8.0)

## 2016-06-21 LAB — COMPREHENSIVE METABOLIC PANEL
ALT: 14 U/L (ref 14–54)
AST: 16 U/L (ref 15–41)
Albumin: 3.3 g/dL — ABNORMAL LOW (ref 3.5–5.0)
Alkaline Phosphatase: 76 U/L (ref 38–126)
Anion gap: 8 (ref 5–15)
BUN: 11 mg/dL (ref 6–20)
CO2: 24 mmol/L (ref 22–32)
Calcium: 8.6 mg/dL — ABNORMAL LOW (ref 8.9–10.3)
Chloride: 106 mmol/L (ref 101–111)
Creatinine, Ser: 0.62 mg/dL (ref 0.44–1.00)
GFR calc Af Amer: >60 mL/min (ref >60)
GFR calc non Af Amer: >60 mL/min (ref >60)
Glucose, Bld: 88 mg/dL (ref 65–99)
Potassium: 4 mmol/L (ref 3.5–5.1)
Sodium: 138 mmol/L (ref 135–145)
Total Bilirubin: 0.5 mg/dL (ref 0.3–1.2)
Total Protein: 6.5 g/dL (ref 6.5–8.1)

## 2016-06-21 LAB — CBC
HCT: 36.6 % (ref 36.0–46.0)
Hemoglobin: 12 g/dL (ref 12.0–15.0)
MCH: 24.6 pg — ABNORMAL LOW (ref 26.0–34.0)
MCHC: 32.8 g/dL (ref 30.0–36.0)
MCV: 75 fL — ABNORMAL LOW (ref 78.0–100.0)
Platelets: 438 10*3/uL — ABNORMAL HIGH (ref 150–400)
RBC: 4.88 MIL/uL (ref 3.87–5.11)
RDW: 16.1 % — ABNORMAL HIGH (ref 11.5–15.5)
WBC: 15 10*3/uL — ABNORMAL HIGH (ref 4.0–10.5)

## 2016-06-21 LAB — LIPASE, BLOOD: Lipase: 17 U/L (ref 11–51)

## 2016-06-21 LAB — I-STAT BETA HCG BLOOD, ED (MC, WL, AP ONLY): I-stat hCG, quantitative: <5 m[IU]/mL (ref <5)

## 2016-06-21 MED ORDER — OXYCODONE-ACETAMINOPHEN 5-325 MG PO TABS
1.0000 | ORAL_TABLET | Freq: Once | ORAL | Status: AC
Start: 2016-06-21 — End: 2016-06-21
  Administered 2016-06-21: 1 via ORAL
  Filled 2016-06-21: qty 1

## 2016-06-21 MED ORDER — SODIUM CHLORIDE 0.9 % IV BOLUS (SEPSIS)
1000.0000 mL | Freq: Once | INTRAVENOUS | Status: AC
  Administered 2016-06-21: 1000 mL via INTRAVENOUS

## 2016-06-21 MED ORDER — IOPAMIDOL (ISOVUE-300) INJECTION 61%
100.0000 mL | Freq: Once | INTRAVENOUS | Status: AC | PRN
  Administered 2016-06-21: 100 mL via INTRAVENOUS

## 2016-06-21 MED ORDER — OXYCODONE-ACETAMINOPHEN 5-325 MG PO TABS: 1.0000 | ORAL_TABLET | ORAL | 0 refills | Status: DC | PRN

## 2016-06-21 MED ORDER — MORPHINE SULFATE (PF) 2 MG/ML IV SOLN
2.0000 mg | Freq: Once | INTRAVENOUS | Status: AC
  Administered 2016-06-21: 2 mg via INTRAVENOUS
  Filled 2016-06-21: qty 1

## 2016-06-21 NOTE — ED Provider Notes (Signed)
[facility]-EMERGENCY DEPT Provider Note   CSN: [id number] Arrival date & time: 06/21/16  1149  First Provider Contact:  None       History   Chief Complaint Chief Complaint  Patient presents with  . Abdominal Pain    HPI Dawn King is a 25 y.o. female.  HPI   25 year old female presents today with left upper quadrant pain. Patient reports symptoms started yesterday around noon. She reports the symptoms have continued to persist, described as sharp and worse with food or drink. She reports symptoms are worse with standing straight up, in certain movements. She reports one episode of diarrhea yesterday, with no normal bowel movement today. She denies any fever, reports that she has slightly increased urinations. Patient reports that on 06/14/2016 she was seen after being knocked over by a horse and landing on her left side.    Past Medical History:  Diagnosis Date  . Anxiety   . H/O multiple concussions   . Pneumonia     Patient Active Problem List   Diagnosis Date Noted  . MONONUCLEOSIS 05/23/2009  . LEUKOCYTOSIS 05/23/2009  . CARDIAC MURMUR 05/23/2009  . PERITONSILLAR ABSCESS 04/28/2009    Past Surgical History:  Procedure Laterality Date  . ADENOIDECTOMY    . TONSILLECTOMY      OB History    No data available       Home Medications    Prior to Admission medications   Medication Sig Start Date End Date Taking? Authorizing Provider  aspirin-acetaminophen-caffeine (EXCEDRIN MIGRAINE) [phone] MG tablet Take 2 tablets by mouth every 6 (six) hours as needed for headache.   Yes Historical Provider, MD  B Complex-Folic Acid (SUPER B COMPLEX MAXI) TABS Take 1 tablet by mouth daily with breakfast.   Yes Historical Provider, MD  Cholecalciferol (VITAMIN D3) 5000 units TABS Take 1 tablet by mouth daily.   Yes Historical Provider, MD  Coenzyme Q10-Fish Oil-Vit E (CO-Q 10 OMEGA-3 FISH OIL) CAPS Take 2 capsules by mouth daily.   Yes Historical Provider, MD    HYDROcodone-acetaminophen (NORCO/VICODIN) 5-325 MG tablet Take 1-2 tablets every 6 hours as needed for severe pain Patient taking differently: Take 0.5 tablets by mouth every 6 (six) hours as needed for moderate pain.  06/14/16  Yes Renne Crigler, PA-C  ibuprofen (ADVIL,MOTRIN) 200 MG tablet Take 400 mg by mouth every 6 (six) hours as needed for fever, headache, mild pain, moderate pain or cramping. For pain    Yes Historical Provider, MD  lamoTRIgine (LAMICTAL) 100 MG tablet Take 150 mg by mouth at bedtime.    Yes Historical Provider, MD  levothyroxine (SYNTHROID, LEVOTHROID) 25 MCG tablet Take 25 mcg by mouth daily with breakfast. 05/16/16  Yes Historical Provider, MD  methocarbamol (ROBAXIN) 500 MG tablet Take 2 tablets (1,000 mg total) by mouth 4 (four) times daily. Patient taking differently: Take 500-1,000 mg by mouth 4 (four) times daily.  06/14/16  Yes Renne Crigler, PA-C  naproxen (NAPROSYN) 500 MG tablet Take 1 tablet (500 mg total) by mouth 2 (two) times daily. 06/14/16  Yes Renne Crigler, PA-C  sertraline (ZOLOFT) 100 MG tablet Take 200 mg by mouth at bedtime.    Yes Historical Provider, MD  TRI-LINYAH 0.18/0.215/0.25 MG-35 MCG tablet Take 1 tablet by mouth daily. 06/11/16  Yes Historical Provider, MD  valACYclovir (VALTREX) 1000 MG tablet Take 4,000 mg by mouth at bedtime.  05/16/16  Yes Historical Provider, MD  oxyCODONE-acetaminophen (PERCOCET/ROXICET) 5-325 MG tablet Take 1 tablet by mouth every 4 (  four) hours as needed for severe pain. 06/21/16   Eyvonne Mechanic, PA-C    Family History No family history on file.  Social History Social History  Substance Use Topics  . Smoking status: Never Smoker  . Smokeless tobacco: Never Used  . Alcohol use No     Allergies   Levaquin [levofloxacin]; Cefaclor; Hydroxyzine; Other; Sunflower oil; Clindamycin/lincomycin; Codeine; Sulfonamide derivatives; and Zofran [ondansetron]   Review of Systems Review of Systems  All other systems reviewed  and are negative.    Physical Exam Updated Vital Signs BP 128/74 (BP Location: Left Arm)   Pulse 89   Temp 98.6 F (37 C) (Oral)   Resp 16   LMP 06/10/2016 (Approximate) Comment: HCG neg 06/21/16  SpO2 95%   Physical Exam  Constitutional: She is oriented to person, place, and time. She appears well-developed and well-nourished.  HENT:  Head: Normocephalic and atraumatic.  Eyes: Conjunctivae are normal. Pupils are equal, round, and reactive to light. Right eye exhibits no discharge. Left eye exhibits no discharge. No scleral icterus.  Neck: Normal range of motion. No JVD present. No tracheal deviation present.  Pulmonary/Chest: Effort normal. No stridor.  Abdominal:  TTP of RUQ/RLQ  Musculoskeletal: Normal range of motion.  Neurological: She is alert and oriented to person, place, and time. Coordination normal.  Skin: Skin is warm and dry.  Psychiatric: She has a normal mood and affect. Her behavior is normal. Judgment and thought content normal.  Nursing note and vitals reviewed.   ED Treatments / Results  Labs (all labs ordered are listed, but only abnormal results are displayed) Labs Reviewed  COMPREHENSIVE METABOLIC PANEL - Abnormal; Notable for the following:       Result Value   Calcium 8.6 (*)    Albumin 3.3 (*)    All other components within normal limits  CBC - Abnormal; Notable for the following:    WBC 15.0 (*)    MCV 75.0 (*)    MCH 24.6 (*)    RDW 16.1 (*)    Platelets 438 (*)    All other components within normal limits  LIPASE, BLOOD  URINALYSIS, ROUTINE W REFLEX MICROSCOPIC (NOT AT [facility])  I-STAT BETA HCG BLOOD, ED ([facility], [facility], [facility] ONLY)    EKG  EKG Interpretation None       Radiology US Transvaginal Non-ob  Result Date: 06/21/2016 CLINICAL DATA:  Left lower quadrant pelvic pain today. Gravida 0. LMP 06/03/2016. EXAM: TRANSABDOMINAL AND TRANSVAGINAL ULTRASOUND OF PELVIS DOPPLER ULTRASOUND OF OVARIES TECHNIQUE: Both transabdominal and transvaginal  ultrasound examinations of the pelvis were performed. Transabdominal technique was performed for global imaging of the pelvis including uterus, ovaries, adnexal regions, and pelvic cul-de-sac. It was necessary to proceed with endovaginal exam following the transabdominal exam to visualize the endometrium and ovaries. Color and duplex Doppler ultrasound was utilized to evaluate blood flow to the ovaries. COMPARISON:  CT 06/21/2016 FINDINGS: Uterus Measurements: 6.5 x 3.5 x 3.8 cm. No fibroids or other mass visualized. Endometrium Thickness: 5.6 mm.  No focal abnormality visualized. Right ovary Measurements: 3.3 x 1.8 x 1.7 cm. Normal appearance/no adnexal mass. Left ovary Measurements: 8.0 x 5.0 x 5.8 cm. A complex cystic structure in the left ovary measures 7.9 x 4.7 x 4.9 cm. There are numerous adjacent cysts versus mass with thick internal septations. The intervening soft tissue is vascular on Doppler evaluation. Pulsed Doppler evaluation of both ovaries demonstrates normal low-resistance arterial and venous waveforms. Other findings Trace free pelvic fluid. IMPRESSION: 1.  Complex left ovarian mass measuring 7.9 cm. The appearance could represent adjacent cysts or mass with thick septations. Consider MRI and possible surgical evaluation. Otherwise, pelvic ultrasound would be suggested in 8-12 weeks. 2. No evidence for ovarian torsion. 3. Normal appearance of the uterus and right ovary. Electronically Signed   By: Norva Pavlov M.D.   On: 06/21/2016 21:16   US Pelvis Complete  Result Date: 06/21/2016 CLINICAL DATA:  Left lower quadrant pelvic pain today. Gravida 0. LMP 06/03/2016. EXAM: TRANSABDOMINAL AND TRANSVAGINAL ULTRASOUND OF PELVIS DOPPLER ULTRASOUND OF OVARIES TECHNIQUE: Both transabdominal and transvaginal ultrasound examinations of the pelvis were performed. Transabdominal technique was performed for global imaging of the pelvis including uterus, ovaries, adnexal regions, and pelvic cul-de-sac. It  was necessary to proceed with endovaginal exam following the transabdominal exam to visualize the endometrium and ovaries. Color and duplex Doppler ultrasound was utilized to evaluate blood flow to the ovaries. COMPARISON:  CT 06/21/2016 FINDINGS: Uterus Measurements: 6.5 x 3.5 x 3.8 cm. No fibroids or other mass visualized. Endometrium Thickness: 5.6 mm.  No focal abnormality visualized. Right ovary Measurements: 3.3 x 1.8 x 1.7 cm. Normal appearance/no adnexal mass. Left ovary Measurements: 8.0 x 5.0 x 5.8 cm. A complex cystic structure in the left ovary measures 7.9 x 4.7 x 4.9 cm. There are numerous adjacent cysts versus mass with thick internal septations. The intervening soft tissue is vascular on Doppler evaluation. Pulsed Doppler evaluation of both ovaries demonstrates normal low-resistance arterial and venous waveforms. Other findings Trace free pelvic fluid. IMPRESSION: 1. Complex left ovarian mass measuring 7.9 cm. The appearance could represent adjacent cysts or mass with thick septations. Consider MRI and possible surgical evaluation. Otherwise, pelvic ultrasound would be suggested in 8-12 weeks. 2. No evidence for ovarian torsion. 3. Normal appearance of the uterus and right ovary. Electronically Signed   By: Norva Pavlov M.D.   On: 06/21/2016 21:16   Ct Abdomen Pelvis W Contrast  Result Date: 06/21/2016 CLINICAL DATA:  Left upper quadrant abdominal pain radiating to the right upper quadrant for several days. Diarrhea, urinary frequency, dysuria, and nausea for 2 days. EXAM: CT ABDOMEN AND PELVIS WITH CONTRAST TECHNIQUE: Multidetector CT imaging of the abdomen and the pelvis was performed using the standard protocol following bolus administration of intravenous contrast. CONTRAST:  ISOVUE-300 IOPAMIDOL (ISOVUE-300) INJECTION 61% COMPARISON:  09/27/2010 FINDINGS: Lower chest: No pulmonary nodules, pleural effusions, or infiltrates. Heart size is normal. No imaged pericardial effusion or  significant coronary artery calcifications. Hepatobiliary: Liver is homogeneous. No focal lesions are identified. Gallbladder is present and normal in appearance. Pancreas: Normal in appearance. Spleen: Normal in appearance. Renal/Adrenal: The adrenal glands are normal in appearance. There is symmetric enhancement of both kidneys. No hydronephrosis or ureteral stone. Gastrointestinal tract: The stomach and small bowel loops are normal in appearance. Colonic loops are normal in appearance. The appendix is well seen and has a normal appearance. Reproductive/Pelvis: The uterus is present and normal appearance. A left adnexal low-attenuation structure is probably an ovarian cyst measuring 4.3 x 3.9 x 7.1 cm. This measures fluid density and appears to contain at least 1 septation. The right ovary is normal by CT appearance. No free pelvic fluid. Vascular/Lymphatic: No evidence for aortic aneurysm. No retroperitoneal or mesenteric adenopathy. Musculoskeletal/Abdominal wall: Visualized osseous structures have a normal appearance. Abdominal wall is unremarkable. Other: none IMPRESSION: 1. Left adnexal cystic structure consistent with ovarian cyst measuring 7.1 cm likely accounting for the patient's pain. Further characterization of this lesion with ultrasound  is recommended. 2. No evidence for acute abnormality of the bowel or urinary tract. 3. Normal appendix. Electronically Signed   By: Norva Pavlov M.D.   On: 06/21/2016 18:58   [place] Art/ven Flow Abd Pelv Doppler  Result Date: 06/21/2016 CLINICAL DATA:  Left lower quadrant pelvic pain today. Gravida 0. LMP 06/03/2016. EXAM: TRANSABDOMINAL AND TRANSVAGINAL ULTRASOUND OF PELVIS DOPPLER ULTRASOUND OF OVARIES TECHNIQUE: Both transabdominal and transvaginal ultrasound examinations of the pelvis were performed. Transabdominal technique was performed for global imaging of the pelvis including uterus, ovaries, adnexal regions, and pelvic cul-de-sac. It was necessary to  proceed with endovaginal exam following the transabdominal exam to visualize the endometrium and ovaries. Color and duplex Doppler ultrasound was utilized to evaluate blood flow to the ovaries. COMPARISON:  CT 06/21/2016 FINDINGS: Uterus Measurements: 6.5 x 3.5 x 3.8 cm. No fibroids or other mass visualized. Endometrium Thickness: 5.6 mm.  No focal abnormality visualized. Right ovary Measurements: 3.3 x 1.8 x 1.7 cm. Normal appearance/no adnexal mass. Left ovary Measurements: 8.0 x 5.0 x 5.8 cm. A complex cystic structure in the left ovary measures 7.9 x 4.7 x 4.9 cm. There are numerous adjacent cysts versus mass with thick internal septations. The intervening soft tissue is vascular on Doppler evaluation. Pulsed Doppler evaluation of both ovaries demonstrates normal low-resistance arterial and venous waveforms. Other findings Trace free pelvic fluid. IMPRESSION: 1. Complex left ovarian mass measuring 7.9 cm. The appearance could represent adjacent cysts or mass with thick septations. Consider MRI and possible surgical evaluation. Otherwise, pelvic ultrasound would be suggested in 8-12 weeks. 2. No evidence for ovarian torsion. 3. Normal appearance of the uterus and right ovary. Electronically Signed   By: Norva Pavlov M.D.   On: 06/21/2016 21:16    Procedures Procedures (including critical care time)  Medications Ordered in ED Medications  sodium chloride 0.9 % bolus 1,000 mL (0 mLs Intravenous Stopped 06/21/16 2041)  morphine 2 MG/ML injection 2 mg (2 mg Intravenous Given 06/21/16 1805)  iopamidol (ISOVUE-300) 61 % injection 100 mL (100 mLs Intravenous Contrast Given 06/21/16 1836)  oxyCODONE-acetaminophen (PERCOCET/ROXICET) 5-325 MG per tablet 1 tablet (1 tablet Oral Given 06/21/16 2149)     Initial Impression / Assessment and Plan / ED Course  I have reviewed the triage vital signs and the nursing notes.  Pertinent labs & imaging results that were available during my care of the patient were  reviewed by me and considered in my medical decision making (see chart for details).  Clinical Course    Final Clinical Impressions(s) / ED Diagnoses   Final diagnoses:  Ovarian mass    Labs: Beta hCG, lipase, CMP , CBC, urinalysis  Imaging: CT abdomen and pelvis , abdominal ultrasound  Consults:  Therapeutics:  Discharge Meds: Percocet  Assessment/Plan: 24 roll female presents today with abdominal discomfort. CT scan showed ovarian mass requesting follow-up abdominal ultrasound. Again ovarian mass noted. Patient appears comfortable on exam bed, she is afebrile and nontoxic. She has mild elevation in WBC. She will need follow-up with OB/GYN for further evaluation and management. She is given follow-up information, she understands the importance of close follow-up. For further evaluation and management here in the ED. She will be discharged home with symptomatic care instructions, pain medication and strict return precautions. She verbalizes understanding and agreement today's plan.      New Prescriptions Discharge Medication List as of 06/21/2016  9:33 PM    START taking these medications   Details  oxyCODONE-acetaminophen (PERCOCET/ROXICET) 5-325 MG tablet Take  1 tablet by mouth every 4 (four) hours as needed for severe pain., Starting Thu 06/21/2016, Print         Eyvonne MechanicJeffrey Trenese Haft, PA-C 06/22/16 1727    Azalia BilisKevin Campos, MD 06/28/16 1039

## 2016-06-21 NOTE — ED Notes (Signed)
PA at bedside.

## 2016-06-21 NOTE — ED Notes (Signed)
Pt verbalized understanding of discharge instructions.  Ambulatory and independent at discharge. 

## 2016-06-21 NOTE — ED Notes (Signed)
Pt transported to CT ?

## 2016-06-21 NOTE — ED Triage Notes (Signed)
Patient presents for LUQ abdominal pain radiating to RUQ. Reports told to return if abdominal pain increased since last visit on 8/3. Reports one episode of watery diarrhea, urinary frequency, dysuria and nausea x2 days.

## 2016-06-21 NOTE — ED Notes (Signed)
Bed: [facility] Expected date: 06/21/16 Expected time:  Means of arrival:  Comments: Needs Sweeping

## 2016-06-21 NOTE — ED Notes (Signed)
Pt was seen for being thrown from horse appx a week ago.  Pt c/o continued and increasing abd pain.

## 2016-06-21 NOTE — Discharge Instructions (Signed)
Please follow-up with OB/GYN for further evaluation and management. Please use medication only as needed for severe pain. Please return to the emergency room immediately if he expands any new or worsening signs or symptoms.

## 2016-06-26 ENCOUNTER — Ambulatory Visit (HOSPITAL_COMMUNITY)
Admission: EM | Admit: 2016-06-26 | Discharge: 2016-06-26 | Disposition: A | Discharge status: Home / Self Care | Payer: BLUE CROSS/BLUE SHIELD | Attending: Family Medicine | Admitting: Family Medicine

## 2016-06-26 ENCOUNTER — Encounter (HOSPITAL_COMMUNITY): Payer: Self-pay | Admitting: Emergency Medicine

## 2016-06-26 DIAGNOSIS — L509 Urticaria, unspecified: Secondary | ICD-10-CM | POA: Diagnosis not present

## 2016-06-26 DIAGNOSIS — T7840XA Allergy, unspecified, initial encounter: Secondary | ICD-10-CM

## 2016-06-26 MED ORDER — METHYLPREDNISOLONE SODIUM SUCC 125 MG IJ SOLR
INTRAMUSCULAR | Status: AC
  Filled 2016-06-26: qty 2

## 2016-06-26 MED ORDER — METHYLPREDNISOLONE SODIUM SUCC 125 MG IJ SOLR
125.0000 mg | Freq: Once | INTRAMUSCULAR | Status: AC
  Administered 2016-06-26: 125 mg via INTRAMUSCULAR

## 2016-06-26 MED ORDER — PREDNISONE 20 MG PO TABS: 40.0000 mg | ORAL_TABLET | Freq: Every day | ORAL | 0 refills | Status: AC

## 2016-06-26 NOTE — ED Triage Notes (Signed)
[facility] with a complaint of a rash all over her body for 6 days. The patient stated that she was evaluated at the ED and given morphine, percocet and IV contrast media. She stated that the IV contrast media made her N/V and she feels that it may have caused the rash.

## 2016-06-26 NOTE — ED Provider Notes (Signed)
CSN: [id number]     Arrival date & time 06/26/16  1301 History   First MD Initiated Contact with Patient 06/26/16 1433     Chief Complaint  Patient presents with  . Rash   (Consider location/radiation/quality/duration/timing/severity/associated sxs/prior Treatment) 25 year old female presents with a full-body rash that started about 6 days ago. Was seen in the ER 7 days ago for ovarian cyst and pelvic pain. Was given Morphine which she seemed to tolerate ok. Also was given IV contrast media which caused vomiting. Before discharge, she was given a Percocet and started experiencing itching about 2 hours later. She woke up the next morning with the rash that started on legs, trunk, back and arms and has become progressively more itchy. She has taken Benadryl orally with no relief in itching. She has a history of recurrent allergic reactions to various medication. The most recent was about 6 months ago when she took Clindamycin and was seen here for a rash due to that medication. She has a follow-up appointment next week with her GYN for further evaluation of her ovarian cyst.    The history is provided by the patient.    Past Medical History:  Diagnosis Date  . Anxiety   . H/O multiple concussions   . Pneumonia    Past Surgical History:  Procedure Laterality Date  . ADENOIDECTOMY    . TONSILLECTOMY     History reviewed. No pertinent family history. Social History  Substance Use Topics  . Smoking status: Never Smoker  . Smokeless tobacco: Never Used  . Alcohol use No   OB History    No data available     Review of Systems  Constitutional: Negative for chills, fatigue and fever.  HENT: Negative for sore throat.   Respiratory: Negative for chest tightness and shortness of breath.   Cardiovascular: Negative for chest pain.  Genitourinary: Positive for pelvic pain.  Skin: Positive for rash.  Neurological: Negative for headaches.    Allergies  Levaquin [levofloxacin]; Cefaclor;  Hydroxyzine; Other; Sunflower oil; Clindamycin/lincomycin; Codeine; Sulfonamide derivatives; and Zofran [ondansetron]  Home Medications   Prior to Admission medications   Medication Sig Start Date End Date Taking? Authorizing Provider  lamoTRIgine (LAMICTAL) 100 MG tablet Take 150 mg by mouth at bedtime.    Yes Historical Provider, MD  levothyroxine (SYNTHROID, LEVOTHROID) 25 MCG tablet Take 25 mcg by mouth daily with breakfast. 05/16/16  Yes Historical Provider, MD  sertraline (ZOLOFT) 100 MG tablet Take 200 mg by mouth at bedtime.    Yes Historical Provider, MD  TRI-LINYAH 0.18/0.215/0.25 MG-35 MCG tablet Take 1 tablet by mouth daily. 06/11/16  Yes Historical Provider, MD  valACYclovir (VALTREX) 1000 MG tablet Take 4,000 mg by mouth at bedtime.  05/16/16  Yes Historical Provider, MD  aspirin-acetaminophen-caffeine (EXCEDRIN MIGRAINE) [phone] MG tablet Take 2 tablets by mouth every 6 (six) hours as needed for headache.    Historical Provider, MD  B Complex-Folic Acid (SUPER B COMPLEX MAXI) TABS Take 1 tablet by mouth daily with breakfast.    Historical Provider, MD  Cholecalciferol (VITAMIN D3) 5000 units TABS Take 1 tablet by mouth daily.    Historical Provider, MD  Coenzyme Q10-Fish Oil-Vit E (CO-Q 10 OMEGA-3 FISH OIL) CAPS Take 2 capsules by mouth daily.    Historical Provider, MD  HYDROcodone-acetaminophen (NORCO/VICODIN) 5-325 MG tablet Take 1-2 tablets every 6 hours as needed for severe pain Patient taking differently: Take 0.5 tablets by mouth every 6 (six) hours as needed for moderate  pain.  06/14/16   Renne CriglerJoshua Geiple, PA-C  ibuprofen (ADVIL,MOTRIN) 200 MG tablet Take 400 mg by mouth every 6 (six) hours as needed for fever, headache, mild pain, moderate pain or cramping. For pain     Historical Provider, MD  methocarbamol (ROBAXIN) 500 MG tablet Take 2 tablets (1,000 mg total) by mouth 4 (four) times daily. Patient taking differently: Take 500-1,000 mg by mouth 4 (four) times daily.  06/14/16    Renne CriglerJoshua Geiple, PA-C  naproxen (NAPROSYN) 500 MG tablet Take 1 tablet (500 mg total) by mouth 2 (two) times daily. 06/14/16   Renne CriglerJoshua Geiple, PA-C  predniSONE (DELTASONE) 20 MG tablet Take 2 tablets (40 mg total) by mouth daily. 06/26/16 07/01/16  Sudie GrumblingAnn Berry Skip Litke, NP   Meds Ordered and Administered this Visit   Medications  methylPREDNISolone sodium succinate (SOLU-MEDROL) 125 mg/2 mL injection 125 mg (125 mg Intramuscular Given 06/26/16 1459)    BP 135/75 (BP Location: Left Arm)   Pulse 98   Temp 97.7 F (36.5 C) (Oral)   Resp 16   LMP 06/10/2016 (Exact Date) Comment: HCG neg 06/21/16  SpO2 98%  No data found.   Physical Exam  Constitutional: She is oriented to person, place, and time. She appears well-developed and well-nourished.  Appears uncomfortable due to the itchiness of her rash  HENT:  Head: Normocephalic and atraumatic.  Mouth/Throat: Uvula is midline, oropharynx is clear and moist and mucous membranes are normal.  Cardiovascular: Normal rate, regular rhythm and normal heart sounds.   Pulmonary/Chest: Effort normal and breath sounds normal.  Neurological: She is alert and oriented to person, place, and time.  Skin: Skin is warm, dry and intact. Capillary refill takes less than 2 seconds. Rash noted. Rash is maculopapular and urticarial.     Entire body is covered with maculopapular and urticarial lesions from neck to lower legs. The most affected areas are her back, abdomen, and upper thighs. No discharge. No lesions on her face or hands. No excoriation but some irritation from scratching- particularly on her upper inner thighs.   Psychiatric: She has a normal mood and affect. Her behavior is normal. Judgment and thought content normal.    Urgent Care Course   Clinical Course    Procedures (including critical care time)  Labs Review Labs Reviewed - No data to display  Imaging Review No results found.   Visual Acuity Review  Right Eye Distance:   Left Eye  Distance:   Bilateral Distance:    Right Eye Near:   Left Eye Near:    Bilateral Near:         MDM   1. Allergic reaction, initial encounter   2. Urticaria    She previously tolerated SoluMedrol IM (no reaction) when she came here for her last medication- allergic reaction, so we will repeat SoluMedrol 125mg  IM today. Then start Prednisone 40mg  daily for 5 days (which she has also tolerated well in the past). No antihistamine recommend since she is allergic to Vistaril. Recommend cool compresses/shower to area for comfort. Follow-up with her primary care provider for further management of her ovarian cyst and pelvic pain or go back to the ER if rash or pelvic pain worsens.     Sudie GrumblingAnn Berry Mamoru Takeshita, NP 06/27/16 2050

## 2016-06-26 NOTE — Discharge Instructions (Signed)
We gave a shot of a steroid today to help with itching and decrease the rash. Also start Prednisone tablets once daily for 5 days. Encouraged to take cool showers to help with itching since Benadryl has not been helpful. Follow-up with your primary care provider if rash does not improve within 2 to 3 days. Also due to recurrent allergic reactions to medication, recommend contact an Allergist for further evaluation.

## 2016-06-28 ENCOUNTER — Encounter: Payer: BLUE CROSS/BLUE SHIELD | Admitting: Family Medicine

## 2016-07-05 ENCOUNTER — Ambulatory Visit (HOSPITAL_COMMUNITY)
Admission: EM | Admit: 2016-07-05 | Discharge: 2016-07-05 | Disposition: A | Discharge status: Home / Self Care | Payer: BLUE CROSS/BLUE SHIELD | Attending: Emergency Medicine | Admitting: Emergency Medicine

## 2016-07-05 ENCOUNTER — Encounter (HOSPITAL_COMMUNITY): Payer: Self-pay | Admitting: Emergency Medicine

## 2016-07-05 DIAGNOSIS — R112 Nausea with vomiting, unspecified: Secondary | ICD-10-CM

## 2016-07-05 DIAGNOSIS — T50905A Adverse effect of unspecified drugs, medicaments and biological substances, initial encounter: Secondary | ICD-10-CM

## 2016-07-05 MED ORDER — PROMETHAZINE HCL 25 MG PO TABS: 25.0000 mg | ORAL_TABLET | Freq: Four times a day (QID) | ORAL | 0 refills | Status: DC | PRN

## 2016-07-05 NOTE — ED Provider Notes (Signed)
CSN: [id number]     Arrival date & time 07/05/16  [id number] History   First MD Initiated Contact with Patient 07/05/16 1942     Chief Complaint  Patient presents with  . Medication Reaction   (Consider location/radiation/quality/duration/timing/severity/associated sxs/prior Treatment) 25 year old female presents with nausea and some vomiting from taking Tramadol. She is allergic or has a reaction to most pain medications (either has hives or has stomach upset) but she needs to take the medication to help with the pain from her ovarian cyst which was diagnosed in the ER on 06/21/16. She was seen last week at Urgent Care due to an allergic reaction (hives) to Percocet which has resolved.  She follows-up with her GYN in 4 days for further evaluation and treatment management of her ovarian cyst. She requests anti-nausea medication for 4 to 5 days until her follow-up appointment. She is unable to take Zofran (hives) but has taken Phenergan in the past without a reaction.       Past Medical History:  Diagnosis Date  . Anxiety   . H/O multiple concussions   . Pneumonia    Past Surgical History:  Procedure Laterality Date  . ADENOIDECTOMY    . TONSILLECTOMY     History reviewed. No pertinent family history. Social History  Substance Use Topics  . Smoking status: Never Smoker  . Smokeless tobacco: Never Used  . Alcohol use No   OB History    No data available     Review of Systems  Constitutional: Positive for fatigue. Negative for fever.  Respiratory: Negative for cough, chest tightness and shortness of breath.   Cardiovascular: Negative for chest pain.  Gastrointestinal: Positive for nausea and vomiting. Negative for diarrhea.  Genitourinary: Positive for pelvic pain.  Skin: Negative for rash.  Allergic/Immunologic: Positive for food allergies.  Neurological: Negative for dizziness and headaches.    Allergies  Levaquin [levofloxacin]; Percocet [oxycodone-acetaminophen]; Cefaclor;  Tramadol; Hydroxyzine; Other; Sunflower oil; Clindamycin/lincomycin; Codeine; Sulfonamide derivatives; and Zofran [ondansetron]  Home Medications   Prior to Admission medications   Medication Sig Start Date End Date Taking? Authorizing Provider  aspirin-acetaminophen-caffeine (EXCEDRIN MIGRAINE) [phone] MG tablet Take 2 tablets by mouth every 6 (six) hours as needed for headache.    Historical Provider, MD  B Complex-Folic Acid (SUPER B COMPLEX MAXI) TABS Take 1 tablet by mouth daily with breakfast.    Historical Provider, MD  Cholecalciferol (VITAMIN D3) 5000 units TABS Take 1 tablet by mouth daily.    Historical Provider, MD  Coenzyme Q10-Fish Oil-Vit E (CO-Q 10 OMEGA-3 FISH OIL) CAPS Take 2 capsules by mouth daily.    Historical Provider, MD  ibuprofen (ADVIL,MOTRIN) 200 MG tablet Take 400 mg by mouth every 6 (six) hours as needed for fever, headache, mild pain, moderate pain or cramping. For pain     Historical Provider, MD  lamoTRIgine (LAMICTAL) 100 MG tablet Take 150 mg by mouth at bedtime.     Historical Provider, MD  levothyroxine (SYNTHROID, LEVOTHROID) 25 MCG tablet Take 25 mcg by mouth daily with breakfast. 05/16/16   Historical Provider, MD  methocarbamol (ROBAXIN) 500 MG tablet Take 2 tablets (1,000 mg total) by mouth 4 (four) times daily. Patient taking differently: Take 500-1,000 mg by mouth 4 (four) times daily.  06/14/16   Renne CriglerJoshua Geiple, PA-C  naproxen (NAPROSYN) 500 MG tablet Take 1 tablet (500 mg total) by mouth 2 (two) times daily. 06/14/16   Renne CriglerJoshua Geiple, PA-C  promethazine (PHENERGAN) 25 MG tablet Take 1 tablet (  25 mg total) by mouth every 6 (six) hours as needed for nausea or vomiting. 07/05/16   Sudie GrumblingAnn Berry Iliya Spivack, NP  sertraline (ZOLOFT) 100 MG tablet Take 200 mg by mouth at bedtime.     Historical Provider, MD  TRI-LINYAH 0.18/0.215/0.25 MG-35 MCG tablet Take 1 tablet by mouth daily. 06/11/16   Historical Provider, MD  valACYclovir (VALTREX) 1000 MG tablet Take 4,000 mg by  mouth at bedtime.  05/16/16   Historical Provider, MD   Meds Ordered and Administered this Visit  Medications - No data to display  BP 142/93 (BP Location: Left Arm)   Pulse 88   Temp 97.9 F (36.6 C) (Oral)   Resp 16   LMP 07/04/2016 (Exact Date)   SpO2 98%  No data found.   Physical Exam  Constitutional: She is oriented to person, place, and time. She appears well-developed and well-nourished. No distress.  HENT:  Head: Normocephalic and atraumatic.  Mouth/Throat: Uvula is midline, oropharynx is clear and moist and mucous membranes are normal.  Cardiovascular: Normal rate, regular rhythm and normal heart sounds.   Pulmonary/Chest: Effort normal and breath sounds normal.  Abdominal: Soft. Normal appearance and bowel sounds are normal. There is tenderness in the left lower quadrant. There is no CVA tenderness.  Neurological: She is alert and oriented to person, place, and time.  Skin: Skin is warm and dry. Capillary refill takes less than 2 seconds.  Psychiatric: She has a normal mood and affect. Her behavior is normal. Judgment and thought content normal.    Urgent Care Course   Clinical Course    Procedures (including critical care time)  Labs Review Labs Reviewed - No data to display  Imaging Review No results found.   Visual Acuity Review  Right Eye Distance:   Left Eye Distance:   Bilateral Distance:    Right Eye Near:   Left Eye Near:    Bilateral Near:         MDM   1. Drug-induced nausea and vomiting    Since unable to take Zofran due to previous reaction, recommend Phenergan 25mg  #20 every 6 to 8 hours as needed for nausea and vomiting. May continue Tramadol for pain since very limited pain medication she can take without a reaction. Go to ER if pain or nausea/vomiting can not be controlled with medication. Otherwise, follow-up with her GYN as planned on 07/09/16.    Sudie GrumblingAnn Berry Curtis Uriarte, NP 07/06/16 [phone]

## 2016-07-05 NOTE — Discharge Instructions (Signed)
Start oral Phenergan 25mg  tonight and may take 1 tablet every 6 hours as needed. It will cause drowsiness. Follow-up with your GYN as planned on 07/09/2016.

## 2016-07-05 NOTE — ED Triage Notes (Signed)
[facility] with a complaint of N/V for 3 days secondary to taking tramadol. The patient stated that she was taking tramadol for pain from an ovarian cyst. The patient stated that she could not stop taking the tramadol due to the pain.

## 2016-09-07 ENCOUNTER — Encounter (HOSPITAL_COMMUNITY): Payer: Self-pay | Admitting: Family Medicine

## 2016-09-07 ENCOUNTER — Encounter (HOSPITAL_COMMUNITY): Payer: Self-pay

## 2016-09-07 ENCOUNTER — Emergency Department (HOSPITAL_COMMUNITY): Payer: BLUE CROSS/BLUE SHIELD

## 2016-09-07 ENCOUNTER — Emergency Department (HOSPITAL_COMMUNITY)
Admission: EM | Admit: 2016-09-07 | Discharge: 2016-09-08 | Disposition: A | Discharge status: Home / Self Care | Payer: BLUE CROSS/BLUE SHIELD | Attending: Emergency Medicine | Admitting: Emergency Medicine

## 2016-09-07 ENCOUNTER — Ambulatory Visit (HOSPITAL_COMMUNITY)
Admission: EM | Admit: 2016-09-07 | Discharge: 2016-09-07 | Disposition: A | Discharge status: Home / Self Care | Payer: BLUE CROSS/BLUE SHIELD | Attending: Internal Medicine | Admitting: Internal Medicine

## 2016-09-07 DIAGNOSIS — R112 Nausea with vomiting, unspecified: Secondary | ICD-10-CM | POA: Diagnosis not present

## 2016-09-07 DIAGNOSIS — R197 Diarrhea, unspecified: Secondary | ICD-10-CM | POA: Diagnosis not present

## 2016-09-07 DIAGNOSIS — R1032 Left lower quadrant pain: Secondary | ICD-10-CM | POA: Insufficient documentation

## 2016-09-07 DIAGNOSIS — R1030 Lower abdominal pain, unspecified: Secondary | ICD-10-CM | POA: Diagnosis present

## 2016-09-07 DIAGNOSIS — Z7982 Long term (current) use of aspirin: Secondary | ICD-10-CM | POA: Diagnosis not present

## 2016-09-07 DIAGNOSIS — M545 Low back pain, unspecified: Secondary | ICD-10-CM

## 2016-09-07 DIAGNOSIS — R1031 Right lower quadrant pain: Secondary | ICD-10-CM | POA: Diagnosis not present

## 2016-09-07 DIAGNOSIS — M546 Pain in thoracic spine: Secondary | ICD-10-CM

## 2016-09-07 LAB — CBC
HCT: 37.5 % (ref 36.0–46.0)
Hemoglobin: 12.4 g/dL (ref 12.0–15.0)
MCH: 25.2 pg — ABNORMAL LOW (ref 26.0–34.0)
MCHC: 33.1 g/dL (ref 30.0–36.0)
MCV: 76.1 fL — ABNORMAL LOW (ref 78.0–100.0)
Platelets: 371 10*3/uL (ref 150–400)
RBC: 4.93 MIL/uL (ref 3.87–5.11)
RDW: 14.8 % (ref 11.5–15.5)
WBC: 15 10*3/uL — ABNORMAL HIGH (ref 4.0–10.5)

## 2016-09-07 LAB — COMPREHENSIVE METABOLIC PANEL
ALT: 14 U/L (ref 14–54)
AST: 17 U/L (ref 15–41)
Albumin: 3.9 g/dL (ref 3.5–5.0)
Alkaline Phosphatase: 84 U/L (ref 38–126)
Anion gap: 12 (ref 5–15)
BUN: 16 mg/dL (ref 6–20)
CO2: 20 mmol/L — ABNORMAL LOW (ref 22–32)
Calcium: 9 mg/dL (ref 8.9–10.3)
Chloride: 105 mmol/L (ref 101–111)
Creatinine, Ser: 1.82 mg/dL — ABNORMAL HIGH (ref 0.44–1.00)
GFR calc Af Amer: 43 mL/min — ABNORMAL LOW (ref >60)
GFR calc non Af Amer: 37 mL/min — ABNORMAL LOW (ref >60)
Glucose, Bld: 95 mg/dL (ref 65–99)
Potassium: 4.3 mmol/L (ref 3.5–5.1)
Sodium: 137 mmol/L (ref 135–145)
Total Bilirubin: 0.6 mg/dL (ref 0.3–1.2)
Total Protein: 7 g/dL (ref 6.5–8.1)

## 2016-09-07 LAB — POCT URINALYSIS DIP (DEVICE)
Bilirubin Urine: NEGATIVE
Glucose, UA: NEGATIVE mg/dL
Ketones, ur: NEGATIVE mg/dL
Nitrite: NEGATIVE
Protein, ur: >=300 mg/dL — AB
Specific Gravity, Urine: 1.02 (ref 1.005–1.030)
Urobilinogen, UA: 0.2 mg/dL (ref 0.0–1.0)
pH: 5.5 (ref 5.0–8.0)

## 2016-09-07 LAB — LIPASE, BLOOD: Lipase: 17 U/L (ref 11–51)

## 2016-09-07 LAB — POCT PREGNANCY, URINE: Preg Test, Ur: NEGATIVE

## 2016-09-07 MED ORDER — PROMETHAZINE HCL 25 MG/ML IJ SOLN
12.5000 mg | Freq: Once | INTRAMUSCULAR | Status: AC
  Administered 2016-09-07: 12.5 mg via INTRAVENOUS
  Filled 2016-09-07: qty 1

## 2016-09-07 MED ORDER — SODIUM CHLORIDE 0.9 % IV BOLUS (SEPSIS)
1000.0000 mL | Freq: Once | INTRAVENOUS | Status: AC
  Administered 2016-09-07: 1000 mL via INTRAVENOUS

## 2016-09-07 MED ORDER — IOPAMIDOL (ISOVUE-300) INJECTION 61%
INTRAVENOUS | Status: AC
  Administered 2016-09-07: 100 mL
  Filled 2016-09-07: qty 75

## 2016-09-07 MED ORDER — MORPHINE SULFATE (PF) 4 MG/ML IV SOLN
4.0000 mg | Freq: Once | INTRAVENOUS | Status: AC
  Administered 2016-09-07: 4 mg via INTRAVENOUS
  Filled 2016-09-07: qty 1

## 2016-09-07 MED ORDER — METHOCARBAMOL 1000 MG/10ML IJ SOLN: 500.0000 mg | Freq: Once | INTRAMUSCULAR | Status: DC

## 2016-09-07 MED ORDER — KETOROLAC TROMETHAMINE 60 MG/2ML IM SOLN: 60.0000 mg | Freq: Once | INTRAMUSCULAR | Status: DC

## 2016-09-07 MED ORDER — PROMETHAZINE HCL 25 MG/ML IJ SOLN: 25.0000 mg | Freq: Once | INTRAMUSCULAR | Status: DC

## 2016-09-07 MED ORDER — KETOROLAC TROMETHAMINE 60 MG/2ML IM SOLN
INTRAMUSCULAR | Status: AC
  Filled 2016-09-07: qty 2

## 2016-09-07 NOTE — ED Triage Notes (Signed)
Pt here for nausea that started earlier today that led to severe back pain and vomiting. sts also generalized severe abd pain. sts back pain is the worse where her kidneys area.

## 2016-09-07 NOTE — ED Provider Notes (Signed)
CSN: [id number]     Arrival date & time 09/07/16  1635 History   First MD Initiated Contact with Patient 09/07/16 1736     Chief Complaint  Patient presents with  . Nausea  . Emesis  . Abdominal Pain   (Consider location/radiation/quality/duration/timing/severity/associated sxs/prior Treatment) HPI Dawn King is a 25 y.o. female presenting to UC with mother c/o nausea that started earlier today followed by severe back pain and vomiting x 3, including on the way to UC.  Mother drove pt here.  She reports having 2 episodes of watery diarrhea last night but none today. Pt now c/o generalized severe abdominal pain.  Back pain is most bothersome for pt around kidneys, 10/10.  She tried taking phenergan at home, which she had for nausea and vomiting from taking tramadol prescribed for an ovarian cyst in August but states she vomited the phenergan back up.  Denies sick contacts or recent travel. Denies urinary symptoms. She notes she did have a large amount of clear mucous like discharge last night but denies concern for STIs. Denies vaginal bleeding or pain.  No hx of kidney stones. Denies hx of abdominal surgeries. Hx of broken back in her childhood.    Past Medical History:  Diagnosis Date  . Anxiety   . H/O multiple concussions   . Pneumonia    Past Surgical History:  Procedure Laterality Date  . ADENOIDECTOMY    . TONSILLECTOMY     History reviewed. No pertinent family history. Social History  Substance Use Topics  . Smoking status: Never Smoker  . Smokeless tobacco: Never Used  . Alcohol use No   OB History    No data available     Review of Systems  Constitutional: Negative for appetite change, chills, fatigue and fever.  Gastrointestinal: Positive for abdominal pain, diarrhea, nausea and vomiting.  Genitourinary: Positive for vaginal discharge (once yesterday, clear mucous). Negative for dysuria, flank pain, frequency, hematuria, pelvic pain, urgency, vaginal bleeding  and vaginal pain.  Musculoskeletal: Positive for back pain and myalgias. Negative for arthralgias, gait problem and joint swelling.  Skin: Negative for rash.  Neurological: Negative for weakness and numbness.    Allergies  Levaquin [levofloxacin]; Percocet [oxycodone-acetaminophen]; Cefaclor; Tramadol; Hydroxyzine; Other; Sunflower oil; Clindamycin/lincomycin; Codeine; Sulfonamide derivatives; and Zofran [ondansetron]  Home Medications   Prior to Admission medications   Medication Sig Start Date End Date Taking? Authorizing Provider  aspirin-acetaminophen-caffeine (EXCEDRIN MIGRAINE) [phone] MG tablet Take 2 tablets by mouth every 6 (six) hours as needed for headache.    Historical Provider, MD  B Complex-Folic Acid (SUPER B COMPLEX MAXI) TABS Take 1 tablet by mouth daily with breakfast.    Historical Provider, MD  Cholecalciferol (VITAMIN D3) 5000 units TABS Take 1 tablet by mouth daily.    Historical Provider, MD  Coenzyme Q10-Fish Oil-Vit E (CO-Q 10 OMEGA-3 FISH OIL) CAPS Take 2 capsules by mouth daily.    Historical Provider, MD  ibuprofen (ADVIL,MOTRIN) 200 MG tablet Take 400 mg by mouth every 6 (six) hours as needed for fever, headache, mild pain, moderate pain or cramping. For pain     Historical Provider, MD  lamoTRIgine (LAMICTAL) 100 MG tablet Take 150 mg by mouth at bedtime.     Historical Provider, MD  levothyroxine (SYNTHROID, LEVOTHROID) 25 MCG tablet Take 25 mcg by mouth daily with breakfast. 05/16/16   Historical Provider, MD  methocarbamol (ROBAXIN) 500 MG tablet Take 2 tablets (1,000 mg total) by mouth 4 (four) times daily. Patient taking  differently: Take 500-1,000 mg by mouth 4 (four) times daily.  06/14/16   Renne CriglerJoshua Geiple, PA-C  naproxen (NAPROSYN) 500 MG tablet Take 1 tablet (500 mg total) by mouth 2 (two) times daily. 06/14/16   Renne CriglerJoshua Geiple, PA-C  promethazine (PHENERGAN) 25 MG tablet Take 1 tablet (25 mg total) by mouth every 6 (six) hours as needed for nausea or  vomiting. 07/05/16   Sudie GrumblingAnn Berry Amyot, NP  sertraline (ZOLOFT) 100 MG tablet Take 200 mg by mouth at bedtime.     Historical Provider, MD  TRI-LINYAH 0.18/0.215/0.25 MG-35 MCG tablet Take 1 tablet by mouth daily. 06/11/16   Historical Provider, MD  valACYclovir (VALTREX) 1000 MG tablet Take 4,000 mg by mouth at bedtime.  05/16/16   Historical Provider, MD   Meds Ordered and Administered this Visit  Medications - No data to display  BP 117/73 (BP Location: Left Arm)   Pulse 105   Temp 97.9 F (36.6 C) (Oral)   Resp 12   LMP 08/31/2016   SpO2 97%  No data found.   Physical Exam  Constitutional: She appears well-developed and well-nourished. She appears distressed.  Pt sitting on exam bed rocking back and forth, tearful. Appears to be in pain and uncomfortable.   HENT:  Head: Normocephalic and atraumatic.  Mouth/Throat: Oropharynx is clear and moist.  Eyes: Conjunctivae are normal. No scleral icterus.  Neck: Normal range of motion.  Cardiovascular: Normal rate, regular rhythm and normal heart sounds.   Pulmonary/Chest: Effort normal and breath sounds normal. No respiratory distress. She has no wheezes. She has no rales.  Abdominal: Soft. Bowel sounds are normal. She exhibits no distension and no mass. There is tenderness. There is CVA tenderness ( bilateral vs muscular tenderness). There is no rebound and no guarding.  Obese abdomen, soft, diffuse tenderness w/o rebound or guarding.  Musculoskeletal: Normal range of motion. She exhibits tenderness. She exhibits no edema.  No midline spinal tenderness. Tenderness to bilateral thoracic and lumbar muscles w/o point tenderness. Full ROM upper and lower extremities bilaterally.   Neurological: She is alert.  Skin: Skin is warm and dry. Capillary refill takes less than 2 seconds. No rash noted. She is not diaphoretic. No erythema.  Nursing note and vitals reviewed.   Urgent Care Course   Clinical Course    Procedures (including critical  care time)  Labs Review Labs Reviewed  POCT URINALYSIS DIP (DEVICE) - Abnormal; Notable for the following:       Result Value   Hgb urine dipstick SMALL (*)    Protein, ur >=300 (*)    Leukocytes, UA TRACE (*)    All other components within normal limits  POCT PREGNANCY, URINE    Imaging Review No results found.   MDM   1. Nausea vomiting and diarrhea   2. Acute bilateral low back pain without sciatica   3. Acute bilateral thoracic back pain    Pt c/o severe back and abdominal pain with associated n/v/d.  Unfortunately pt was not able to keep down PO phenergan at home and is allergic to zofran. IM phenergan not available in UC.   Pt would like a muscle relaxer but none available in UC either. Pt agreeable to IM Toradol 60mg  IM  UA: not convincing for UTI/pyelonephritis to suggest cause of pt's current symptoms.  Discussed limitations of UC setting. Do to amount of discomfort pt is in, recommend she go to emergency department for further evaluation and treatment. [facility]. Pt in stable condition.     Junius Finner, PA-C 09/07/16 1826

## 2016-09-07 NOTE — ED Triage Notes (Signed)
Per pt, Pt is coming from home with pain to her generalized abdomen and lower back that started last night and has been associated with nausea and vomiting with diarrhea last night. Denies burning with urination. Was given Toradol at UC, and pt reports vomiting after phenergan from home.

## 2016-09-08 MED ORDER — HYDROCODONE-ACETAMINOPHEN 5-325 MG PO TABS: 1.0000 | ORAL_TABLET | ORAL | 0 refills | Status: DC | PRN

## 2016-09-08 MED ORDER — MORPHINE SULFATE (PF) 4 MG/ML IV SOLN
4.0000 mg | Freq: Once | INTRAVENOUS | Status: AC
  Administered 2016-09-08: 4 mg via INTRAVENOUS
  Filled 2016-09-08: qty 1

## 2016-09-08 MED ORDER — PROMETHAZINE HCL 25 MG RE SUPP: 25.0000 mg | Freq: Four times a day (QID) | RECTAL | 0 refills | Status: DC | PRN

## 2016-09-08 NOTE — ED Notes (Signed)
Pt stable, understands discharge instructions, and reasons for return.   

## 2016-09-11 ENCOUNTER — Ambulatory Visit (HOSPITAL_COMMUNITY)
Admission: EM | Admit: 2016-09-11 | Discharge: 2016-09-11 | Disposition: A | Discharge status: Home / Self Care | Payer: BLUE CROSS/BLUE SHIELD | Attending: Family Medicine | Admitting: Family Medicine

## 2016-09-11 ENCOUNTER — Encounter (HOSPITAL_COMMUNITY): Payer: Self-pay | Admitting: Emergency Medicine

## 2016-09-11 DIAGNOSIS — T7840XA Allergy, unspecified, initial encounter: Secondary | ICD-10-CM

## 2016-09-11 MED ORDER — METHYLPREDNISOLONE 4 MG PO TBPK: ORAL_TABLET | ORAL | 0 refills | Status: DC

## 2016-09-11 NOTE — Discharge Instructions (Signed)
This appears to be an allergic reaction. My best estimate is that this is from opiate reaction. If at all possible, try to avoid taking any kind of opiate in the future.

## 2016-09-11 NOTE — ED Triage Notes (Signed)
[facility] a [facility] of a rash all over her body that started 4 days ago. The patient reported that she believed that it is a medication reaction to either, morphine, contrast dye, or phenergan that she was given in the ED the day prior.

## 2016-09-11 NOTE — ED Provider Notes (Signed)
[facility]    CSN: [id number] Arrival date & time: 09/11/16  1017     History   Chief Complaint Chief Complaint  Patient presents with  . Medication Reaction    HPI Dawn King is a 25 y.o. female.   [facility] a complaint of a rash all over her body that started 4 days ago. The patient reported that she believed that it is a medication reaction to either morphine, contrast dye, or phenergan that she was given in the ED the day prior.  Patient has multiple allergies. She's had this kind of reaction once before when she was given morphine and contrast dye. At that time, the problem wasn't as severe.  She's been taking prednisone twice a day for the last several days without any improvement. She cannot tolerate Atarax.  Patient works in a [employer], [employer] office.      Past Medical History:  Diagnosis Date  . Anxiety   . H/O multiple concussions   . Pneumonia     Patient Active Problem List   Diagnosis Date Noted  . MONONUCLEOSIS 05/23/2009  . LEUKOCYTOSIS 05/23/2009  . CARDIAC MURMUR 05/23/2009  . PERITONSILLAR ABSCESS 04/28/2009    Past Surgical History:  Procedure Laterality Date  . ADENOIDECTOMY    . TONSILLECTOMY      OB History    No data available       Home Medications    Prior to Admission medications   Medication Sig Start Date End Date Taking? Authorizing Provider  aspirin-acetaminophen-caffeine (EXCEDRIN MIGRAINE) [phone] MG tablet Take 2 tablets by mouth every 6 (six) hours as needed for headache.    Historical Provider, MD  B Complex-Folic Acid (SUPER B COMPLEX MAXI) TABS Take 1 tablet by mouth daily with breakfast.    Historical Provider, MD  Cholecalciferol (VITAMIN D3) 5000 units TABS Take 1 tablet by mouth daily.    Historical Provider, MD  Coenzyme Q10-Fish Oil-Vit E (CO-Q 10 OMEGA-3 FISH OIL) CAPS Take 2 capsules by mouth daily.    Historical Provider, MD  ibuprofen (ADVIL,MOTRIN) 200 MG  tablet Take 400 mg by mouth every 6 (six) hours as needed for fever, headache, mild pain, moderate pain or cramping. For pain     Historical Provider, MD  lamoTRIgine (LAMICTAL) 100 MG tablet Take 150 mg by mouth at bedtime.     Historical Provider, MD  levothyroxine (SYNTHROID, LEVOTHROID) 25 MCG tablet Take 25 mcg by mouth daily with breakfast. 05/16/16   Historical Provider, MD  methylPREDNISolone (MEDROL DOSEPAK) 4 MG TBPK tablet As directed 09/11/16   Elvina SidleKurt Margurette Brener, MD  sertraline (ZOLOFT) 100 MG tablet Take 200 mg by mouth at bedtime.     Historical Provider, MD  traMADol (ULTRAM) 50 MG tablet Take 50 mg by mouth every 6 (six) hours as needed for moderate pain.    Historical Provider, MD  TRI-LINYAH 0.18/0.215/0.25 MG-35 MCG tablet Take 1 tablet by mouth daily. 06/11/16   Historical Provider, MD  valACYclovir (VALTREX) 1000 MG tablet Take 4,000 mg by mouth at bedtime.  05/16/16   Historical Provider, MD    Family History History reviewed. No pertinent family history.  Social History Social History  Substance Use Topics  . Smoking status: Never Smoker  . Smokeless tobacco: Never Used  . Alcohol use Yes     Comment: Occasionally      Allergies   Levaquin [levofloxacin]; Percocet [oxycodone-acetaminophen]; Cefaclor; Tramadol; Hydroxyzine; Other; Sunflower oil; Clindamycin/lincomycin; Codeine; Keflex [cephalexin]; Sulfonamide  derivatives; and Zofran [ondansetron]   Review of Systems Review of Systems  Constitutional: Negative.   HENT: Negative.   Respiratory: Negative.   Skin: Positive for rash.     Physical Exam Triage Vital Signs ED Triage Vitals  Enc Vitals Group     BP 09/11/16 1036 112/79     Pulse Rate 09/11/16 1036 79     Resp 09/11/16 1036 18     Temp 09/11/16 1036 97.9 F (36.6 C)     Temp Source 09/11/16 1036 Oral     SpO2 09/11/16 1036 98 %     Weight --      Height --      Head Circumference --      Peak Flow --      Pain Score 09/11/16 1040 0     Pain  Loc --      Pain Edu? --      Excl. in GC? --    No data found.   Updated Vital Signs BP 112/79 (BP Location: Right Arm)   Pulse 79   Temp 97.9 F (36.6 C) (Oral)   Resp 18   LMP 08/31/2016   SpO2 98%    Physical Exam  Constitutional: She appears well-developed and well-nourished.  HENT:  Head: Normocephalic.  Right Ear: External ear normal.  Left Ear: External ear normal.  Nose: Nose normal.  Mouth/Throat: Oropharynx is clear and moist.  Eyes: Conjunctivae and EOM are normal. Pupils are equal, round, and reactive to light.  Neck: Normal range of motion. Neck supple.  Cardiovascular: Normal rate, regular rhythm and normal heart sounds.   Pulmonary/Chest: Effort normal and breath sounds normal.  Skin: Skin is warm and dry.  Patient has total body erythema without papular rash. Her face looks as if she's been in the sun for several hours. Her forearms, likewise, have a diffuse erythema.  Nursing note and vitals reviewed.    UC Treatments / Results  Labs (all labs ordered are listed, but only abnormal results are displayed) Labs Reviewed - No data to display  EKG  EKG Interpretation None       Radiology No results found.  Procedures Procedures (including critical care time)  Medications Ordered in UC Medications - No data to display   Initial Impression / Assessment and Plan / UC Course  I have reviewed the triage vital signs and the nursing notes.  Pertinent labs & imaging results that were available during my care of the patient were reviewed by me and considered in my medical decision making (see chart for details).  Clinical Course    Final Clinical Impressions(s) / UC Diagnoses   Final diagnoses:  Allergic reaction, initial encounter  It's really impossible to say what patient is allergic to. She's not responding to prednisone some giving her a larger dose of cortisone through a Medrol Dosepak.  New Prescriptions New Prescriptions    METHYLPREDNISOLONE (MEDROL DOSEPAK) 4 MG TBPK TABLET    As directed     Elvina SidleKurt Izael Bessinger, MD 09/11/16 1101

## 2016-09-13 NOTE — ED Provider Notes (Signed)
[facility]Y DEPT Provider Note   CSN: [id number] Arrival date & time: 09/07/16  1813     History   Chief Complaint Chief Complaint  Patient presents with  . Abdominal Pain    HPI Dawn King is a 25 y.o. female.  Presents with pain across lower abdomen extending into lower back since last night. She has had nausea and vomiting today but no diarrhea, fever, or dysuria. She denies history of the same. No change in her regular menses, vaginal discharge, constipation. She was seen at Urgent care prior to arrival and was given Toradol with some relief. She has phenergan at home which did not prevent vomiting.    The history is provided by the patient. No language interpreter was used.  Abdominal Pain   Associated symptoms include nausea and vomiting. Pertinent negatives include fever, diarrhea, constipation and dysuria.    Past Medical History:  Diagnosis Date  . Anxiety   . H/O multiple concussions   . Pneumonia     Patient Active Problem List   Diagnosis Date Noted  . MONONUCLEOSIS 05/23/2009  . LEUKOCYTOSIS 05/23/2009  . CARDIAC MURMUR 05/23/2009  . PERITONSILLAR ABSCESS 04/28/2009    Past Surgical History:  Procedure Laterality Date  . ADENOIDECTOMY    . TONSILLECTOMY      OB History    No data available       Home Medications    Prior to Admission medications   Medication Sig Start Date End Date Taking? Authorizing Provider  aspirin-acetaminophen-caffeine (EXCEDRIN MIGRAINE) [phone] MG tablet Take 2 tablets by mouth every 6 (six) hours as needed for headache.   Yes Historical Provider, MD  B Complex-Folic Acid (SUPER B COMPLEX MAXI) TABS Take 1 tablet by mouth daily with breakfast.   Yes Historical Provider, MD  Cholecalciferol (VITAMIN D3) 5000 units TABS Take 1 tablet by mouth daily.   Yes Historical Provider, MD  Coenzyme Q10-Fish Oil-Vit E (CO-Q 10 OMEGA-3 FISH OIL) CAPS Take 2 capsules by mouth daily.   Yes Historical Provider, MD    ibuprofen (ADVIL,MOTRIN) 200 MG tablet Take 400 mg by mouth every 6 (six) hours as needed for fever, headache, mild pain, moderate pain or cramping. For pain    Yes Historical Provider, MD  lamoTRIgine (LAMICTAL) 100 MG tablet Take 150 mg by mouth at bedtime.    Yes Historical Provider, MD  levothyroxine (SYNTHROID, LEVOTHROID) 25 MCG tablet Take 25 mcg by mouth daily with breakfast. 05/16/16  Yes Historical Provider, MD  sertraline (ZOLOFT) 100 MG tablet Take 200 mg by mouth at bedtime.    Yes Historical Provider, MD  traMADol (ULTRAM) 50 MG tablet Take 50 mg by mouth every 6 (six) hours as needed for moderate pain.   Yes Historical Provider, MD  TRI-LINYAH 0.18/0.215/0.25 MG-35 MCG tablet Take 1 tablet by mouth daily. 06/11/16  Yes Historical Provider, MD  valACYclovir (VALTREX) 1000 MG tablet Take 4,000 mg by mouth at bedtime.  05/16/16  Yes Historical Provider, MD  methylPREDNISolone (MEDROL DOSEPAK) 4 MG TBPK tablet As directed 09/11/16   Elvina Sidle, MD    Family History No family history on file.  Social History Social History  Substance Use Topics  . Smoking status: Never Smoker  . Smokeless tobacco: Never Used  . Alcohol use Yes     Comment: Occasionally      Allergies   Levaquin [levofloxacin]; Percocet [oxycodone-acetaminophen]; Cefaclor; Tramadol; Hydroxyzine; Other; Sunflower oil; Clindamycin/lincomycin; Codeine; Keflex [cephalexin]; Morphine and related; Sulfonamide derivatives; and Zofran [ondansetron]  Review of Systems Review of Systems  Constitutional: Negative for chills and fever.  HENT: Negative.   Respiratory: Negative.   Cardiovascular: Negative.   Gastrointestinal: Positive for abdominal pain, nausea and vomiting. Negative for constipation and diarrhea.  Genitourinary: Negative for dysuria, flank pain, vaginal bleeding and vaginal discharge.  Musculoskeletal: Positive for back pain.  Skin: Negative.   Neurological: Negative.  Negative for  light-headedness.     Physical Exam Updated Vital Signs BP 120/73   Pulse 71   Temp 98.1 F (36.7 C) (Oral)   Resp 20   Ht 5\' 5"  (1.651 m)   Wt 108.9 kg   LMP 08/31/2016   SpO2 96%   BMI 39.94 kg/m   Physical Exam  Constitutional: She appears well-developed and well-nourished.  HENT:  Head: Normocephalic.  Neck: Normal range of motion. Neck supple.  Cardiovascular: Normal rate and regular rhythm.   Pulmonary/Chest: Effort normal and breath sounds normal. She has no wheezes. She has no rales.  Abdominal: Soft. Bowel sounds are normal. There is tenderness (Bilateral lower abdomen.). There is no rebound and no guarding.  Genitourinary:  Genitourinary Comments: No CVA tenderness.   Musculoskeletal: Normal range of motion.  No reproducible lower back tenderness.   Neurological: She is alert. No cranial nerve deficit.  Skin: Skin is warm and dry. No rash noted.  Psychiatric: She has a normal mood and affect.     ED Treatments / Results  Labs (all labs ordered are listed, but only abnormal results are displayed) Labs Reviewed  COMPREHENSIVE METABOLIC PANEL - Abnormal; Notable for the following:       Result Value   CO2 20 (*)    Creatinine, Ser 1.82 (*)    GFR calc non Af Amer 37 (*)    GFR calc Af Amer 43 (*)    All other components within normal limits  CBC - Abnormal; Notable for the following:    WBC 15.0 (*)    MCV 76.1 (*)    MCH 25.2 (*)    All other components within normal limits  LIPASE, BLOOD    EKG  EKG Interpretation None       Radiology No results found.  Procedures Procedures (including critical care time)  Medications Ordered in ED Medications  sodium chloride 0.9 % bolus 1,000 mL (0 mLs Intravenous Stopped 09/08/16 0125)  promethazine (PHENERGAN) injection 12.5 mg (12.5 mg Intravenous Given 09/07/16 2216)  morphine 4 MG/ML injection 4 mg (4 mg Intravenous Given 09/07/16 2235)  iopamidol (ISOVUE-300) 61 % injection (100 mLs  Contrast  Given 09/07/16 2326)  morphine 4 MG/ML injection 4 mg (4 mg Intravenous Given 09/08/16 0130)     Initial Impression / Assessment and Plan / ED Course  I have reviewed the triage vital signs and the nursing notes.  Pertinent labs & imaging results that were available during my care of the patient were reviewed by me and considered in my medical decision making (see chart for details).  Clinical Course    Patient with lower abdominal pain and tenderness, sent from UC for further evaluation. Pain managed with Toradol for now.   CT scan performed and is negative for acute findings. Discussed a pelvic exam with the patient who declines as she has no concern for pelvic origin of pain - no discharge, irregular bleeding.   Elevated cr (1.82) normal BUN thought secondary to vomiting and mild dehydration. IVF's provided.   She can follow up with her doctor for further management. Stable for  discharge.   Final Clinical Impressions(s) / ED Diagnoses   Final diagnoses:  Lower abdominal pain    New Prescriptions Discharge Medication List as of 09/08/2016  1:20 AM    START taking these medications   Details  !! HYDROcodone-acetaminophen (NORCO/VICODIN) 5-325 MG tablet Take 1-2 tablets by mouth every 4 (four) hours as needed., Starting Sat 09/08/2016, Print    promethazine (PHENERGAN) 25 MG suppository Place 1 suppository (25 mg total) rectally every 6 (six) hours as needed for nausea or vomiting., Starting Sat 09/08/2016, Print     !! - Potential duplicate medications found. Please discuss with provider.       Elpidio AnisShari Shamyia Grandpre, PA-C 09/13/16 1811    Gwyneth SproutWhitney Plunkett, MD 09/16/16 1500

## 2016-10-24 ENCOUNTER — Encounter: Payer: Self-pay | Admitting: Physical Therapy

## 2016-10-24 ENCOUNTER — Ambulatory Visit: Payer: BLUE CROSS/BLUE SHIELD | Attending: *Deleted | Admitting: Physical Therapy

## 2016-10-24 DIAGNOSIS — M545 Low back pain, unspecified: Secondary | ICD-10-CM

## 2016-10-24 DIAGNOSIS — G8929 Other chronic pain: Secondary | ICD-10-CM | POA: Insufficient documentation

## 2016-10-24 NOTE — Therapy (Addendum)
[place], [place], [address] Phone: [phone]   Fax:  [phone]  Physical Therapy Evaluation/Discharge Summary  Patient Details  Name: Dawn King MRN: [id number] Date of Birth: June 04, 1991 Referring Provider: Carolynne Edouard, MD  Encounter Date: 10/24/2016      PT End of Session - 10/24/16 0846    Visit Number 1   Number of Visits 9   Date for PT Re-Evaluation 11/23/16   Authorization Type [employer] 30 visit limit   PT Start Time 0846   PT Stop Time 0932   PT Time Calculation (min) 46 min   Activity Tolerance Patient tolerated treatment well   Behavior During Therapy [facility] for tasks assessed/performed      Past Medical History:  Diagnosis Date  . Anxiety   . H/O multiple concussions   . Pneumonia     Past Surgical History:  Procedure Laterality Date  . ADENOIDECTOMY    . TONSILLECTOMY      There were no vitals filed for this visit.       Subjective Assessment - 10/24/16 0850    Subjective Within the last 8 months, comes and goes. Pain when sitting, getting up from sitting and the first few steps. No falls. H/O LBP following L4/5 fracture in 9th grade. Has broken tailbone when she was 10. Rides horses 2-3 time/week, has unequal stirrup length.    How long can you sit comfortably? sitting hurts but she is not limited, act of standing is what hurts the most   Diagnostic tests waiting for call from radiology for xray   Currently in Pain? Yes   Pain Score 5    Pain Location Coccyx   Pain Descriptors / Indicators Sharp   Aggravating Factors  standing from sitting, slouching back on coccyx, first few steps after standing   Pain Relieving Factors ibuprofen (limited help), avoid standing            [facility] PT Assessment - 10/24/16 0001      Assessment   Medical Diagnosis Coccyx pain   Referring Provider Carolynne Edouard, MD   Hand Dominance Right   Next MD Visit 2 months   Prior  Therapy not for this issue     Precautions   Precautions None     Restrictions   Weight Bearing Restrictions No     Balance Screen   Has the patient fallen in the past 6 months Yes   How many times? 1  fell from horse     [place] residence   Living Arrangements Parent   Additional Comments 3 steps     Prior Function   Level of Independence Independent     Cognition   Overall Cognitive Status Within Functional Limits for tasks assessed     Sensation   Additional Comments [facility]     Posture/Postural Control   Posture Comments anterior pelvic tilt     ROM / Strength   AROM / PROM / Strength Strength;AROM     AROM   AROM Assessment Site Lumbar     Strength   Strength Assessment Site Hip   Right/Left Hip Right;Left   Right Hip Flexion 4-/5  painful lowering   Right Hip Extension 4/5   Left Hip Flexion 4-/5   Left Hip Extension 4-/5     Flexibility   Soft Tissue Assessment /Muscle Length yes   Hamstrings tight bilaterally  pt denies regular stretching  Palpation   Palpation comment bilat SIJ TTP, denied TTP at coccyx; L leg appears longer than R in supine                   [facility] Adult PT Treatment/Exercise - 10/24/16 0001      Exercises   Exercises Knee/Hip     Knee/Hip Exercises: Stretches   Other Knee/Hip Stretches lower trunk rotation     Knee/Hip Exercises: Seated   Other Seated Knee/Hip Exercises bed mobility sit<>supine     Knee/Hip Exercises: Supine   Other Supine Knee/Hip Exercises iso abd/add with ball/belt     Modalities   Modalities Cryotherapy     Cryotherapy   Number Minutes Cryotherapy 10 Minutes  3 min concurrent with POC & HEP discussion   Cryotherapy Location Lumbar Spine  SIJ   Type of Cryotherapy Ice pack     Manual Therapy   Manual Therapy Muscle Energy Technique   Manual therapy comments L post/R anterior innom rotation   Muscle Energy Technique innominate rotation  correction, iso abd/add, rotational stretch                PT Education - 10/24/16 0929    Education provided Yes   Education Details anatomy of condition, POC, HEP, exercise form/rationale, bed mobility   Person(s) Educated Patient   Methods Explanation;Demonstration;Tactile cues;Verbal cues;Handout   Comprehension Verbalized understanding;Returned demonstration;Verbal cues required;Tactile cues required;Need further instruction          PT Short Term Goals - 10/24/16 0930      PT SHORT TERM GOAL #1   Title Pt will be able to stand from a chair at least 75% of the time without pain in coccyx/sacral region by 11/23/16   Baseline pain with each stand at eval   Time 4   Period Weeks   Status New     PT SHORT TERM GOAL #2   Title pt will verbalize understanding of innominate rotation correction for long-term management   Baseline began educating at eval   Time 4   Period Weeks   Status New     PT SHORT TERM GOAL #3   Title Hip MMT to 4+/5 to indicate significant stabilization provided to pelvis from surrounding musculature   Baseline see flowsheet   Time 4   Period Weeks   Status New     PT SHORT TERM GOAL #4   Title Pt will be able to ride horses without limitations due to pain to return to PLOF.    Baseline limited at eval   Time 4   Period Weeks   Status New                  Plan - 10/24/16 1610    Clinical Impression Statement Pt presents to PT with complaints of pain in coccyx/lower back region. Notable functional LLD- L longer than R that was corrected with MET. Pt was able to stand without pain following treatment today. Pt will benefit from skilled PT in order to stabilize lumbo pelvic region to decrease innominate rotation that is causing sacral region pain.    Rehab Potential Good   PT Frequency 2x / week   PT Duration 4 weeks      Patient will benefit from skilled therapeutic intervention in order to improve the following deficits and  impairments:  Pain, Improper body mechanics, Postural dysfunction, Decreased strength, Decreased activity tolerance  Visit Diagnosis: Chronic bilateral low back pain without sciatica - Plan: PT  plan of care cert/re-cert     Problem List Patient Active Problem List   Diagnosis Date Noted  . MONONUCLEOSIS 05/23/2009  . LEUKOCYTOSIS 05/23/2009  . CARDIAC MURMUR 05/23/2009  . PERITONSILLAR ABSCESS 04/28/2009    PHYSICAL THERAPY DISCHARGE SUMMARY  Visits from Start of Care: 1  Current functional level related to goals / functional outcomes: See above   Remaining deficits: See above   Education / Equipment: Anatomy of condition,  Poc, hep, exercise form/rationale  Plan: Patient agrees to discharge.  Patient goals were not met. Patient is being discharged due to not returning since the last visit.  ?????    Marvelous Woolford C. Sharissa Brierley PT, DPT 12/10/16 1:18 PM    [facility] [address] [place], [place], [address] Phone: [phone]   Fax:  [phone]  Name: Dawn King MRN: [id number] Date of Birth: 1991/05/25

## 2016-10-30 ENCOUNTER — Ambulatory Visit: Payer: BLUE CROSS/BLUE SHIELD | Admitting: Physical Therapy

## 2016-11-08 ENCOUNTER — Ambulatory Visit: Payer: BLUE CROSS/BLUE SHIELD | Admitting: Physical Therapy

## 2016-11-09 ENCOUNTER — Ambulatory Visit: Payer: BLUE CROSS/BLUE SHIELD | Admitting: Physical Therapy

## 2016-11-14 ENCOUNTER — Ambulatory Visit: Payer: BLUE CROSS/BLUE SHIELD | Attending: *Deleted | Admitting: Physical Therapy

## 2016-11-15 ENCOUNTER — Telehealth: Payer: Self-pay | Admitting: Physical Therapy

## 2016-11-15 ENCOUNTER — Ambulatory Visit: Payer: BLUE CROSS/BLUE SHIELD | Admitting: Physical Therapy

## 2016-11-15 NOTE — Telephone Encounter (Signed)
Pt contacted by Army FossaJessica Love Milbourne 8:04 AM on 1/4. I let the patient know that she has NS her last 4 appointments and has apt today at 11:45 am. I told her that if she does not call or come to her appointment today, all of her appointments will be cancelled.  Thor Nannini C. Kaysie Michelini PT, DPT 11/15/16 8:06 AM

## 2016-11-19 ENCOUNTER — Ambulatory Visit: Payer: BLUE CROSS/BLUE SHIELD | Admitting: Physical Therapy

## 2016-11-21 ENCOUNTER — Ambulatory Visit: Payer: BLUE CROSS/BLUE SHIELD | Admitting: Physical Therapy

## 2018-01-06 ENCOUNTER — Other Ambulatory Visit: Payer: Self-pay | Admitting: Obstetrics and Gynecology

## 2018-01-06 DIAGNOSIS — R928 Other abnormal and inconclusive findings on diagnostic imaging of breast: Secondary | ICD-10-CM

## 2018-01-13 ENCOUNTER — Other Ambulatory Visit: Payer: Self-pay | Admitting: Obstetrics and Gynecology

## 2018-01-13 DIAGNOSIS — Z803 Family history of malignant neoplasm of breast: Secondary | ICD-10-CM

## 2018-01-15 ENCOUNTER — Ambulatory Visit
Admission: RE | Admit: 2018-01-15 | Discharge: 2018-01-15 | Disposition: A | Discharge status: Home / Self Care | Payer: BLUE CROSS/BLUE SHIELD | Source: Ambulatory Visit | Attending: Obstetrics and Gynecology | Admitting: Obstetrics and Gynecology

## 2018-01-15 ENCOUNTER — Ambulatory Visit: Payer: BLUE CROSS/BLUE SHIELD

## 2018-01-15 ENCOUNTER — Other Ambulatory Visit: Payer: Self-pay | Admitting: Obstetrics and Gynecology

## 2018-01-15 DIAGNOSIS — R928 Other abnormal and inconclusive findings on diagnostic imaging of breast: Secondary | ICD-10-CM

## 2018-01-15 DIAGNOSIS — N6320 Unspecified lump in the left breast, unspecified quadrant: Secondary | ICD-10-CM

## 2018-03-10 ENCOUNTER — Encounter (HOSPITAL_COMMUNITY): Payer: Self-pay | Admitting: Emergency Medicine

## 2018-03-10 ENCOUNTER — Ambulatory Visit (HOSPITAL_COMMUNITY)
Admission: EM | Admit: 2018-03-10 | Discharge: 2018-03-10 | Disposition: A | Discharge status: Home / Self Care | Payer: BLUE CROSS/BLUE SHIELD | Attending: Internal Medicine | Admitting: Internal Medicine

## 2018-03-10 ENCOUNTER — Other Ambulatory Visit: Payer: Self-pay

## 2018-03-10 DIAGNOSIS — R0789 Other chest pain: Secondary | ICD-10-CM

## 2018-03-10 MED ORDER — DICYCLOMINE HCL 20 MG PO TABS: 20.0000 mg | ORAL_TABLET | Freq: Three times a day (TID) | ORAL | 0 refills | Status: DC

## 2018-03-10 MED ORDER — OMEPRAZOLE 20 MG PO CPDR: 20.0000 mg | DELAYED_RELEASE_CAPSULE | Freq: Every day | ORAL | 0 refills | Status: DC

## 2018-03-10 NOTE — Discharge Instructions (Signed)
Please take medications as prescribed.  Review bland diet paperwork and avoid foods that increase acid production.  If any increasing chest pain, nausea, vomiting, fevers, return to the clinic or ED.

## 2018-03-10 NOTE — ED Triage Notes (Addendum)
Pt reports central chest pain that started on Friday.  She has had some associated left arm numbness and dizziness.  She was at the desk checking in when she began to get teary eyed and complained of left back pain.  Pt was SOB and crying and stated that the back pain on the left had just started.  She reports the feeling in her arm has come back but it is still tingling.  Pt has become more calm during triage and is no longer breathing fast or crying.  She states the pain is only there if she takes a deep breath.  Pt reports a hx of congenital ventricular stenosis that she was followed for until she was 16.  Pt now states she believes she was having a panic attack.

## 2018-03-10 NOTE — ED Provider Notes (Signed)
[facility]    CSN: [id number] Arrival date & time: 03/10/18  1555     History   Chief Complaint Chief Complaint  Patient presents with  . Chest Pain  . arm numbness  . Back Pain    HPI Dawn King is a 27 y.o. female presents to the urgent care facility for evaluation of intermittent chest discomfort this been present for the last 3 days.  She has a history of acid reflux disease and has not had any improvement with Zantac, Alka-Seltzer, Tums.  She describes sharp burning chest pain that been constant but fluctuating, increased with eating certain foods.  Patient points to the epigastric region and states the pain does not radiate.  She denies any bloody stools, nausea or vomiting.  She denies any fevers.  No increased chest pain with exertion.  Pain was initially 3 out of 10 and at this time her pain is a 1 out of 10.  Patient states that times her pain is been sharp.  She has noticed episodes of numbness in her left arm.  She has a history of panic attacks and knows that while talking to triage she was definitely having a panic attack in which she was experiencing chest pain and numbness in her left arm.  Currently states she is not having any anxiety and her chest pain and numbness in her left arm has resolved.  Patient denies any shortness of breath, lower extremity swelling.  Patient does not smoke, no history of high cholesterol.  No family history of early cardiac disease  HPI  Past Medical History:  Diagnosis Date  . Anxiety   . H/O multiple concussions   . Pneumonia     Patient Active Problem List   Diagnosis Date Noted  . MONONUCLEOSIS 05/23/2009  . LEUKOCYTOSIS 05/23/2009  . CARDIAC MURMUR 05/23/2009  . PERITONSILLAR ABSCESS 04/28/2009    Past Surgical History:  Procedure Laterality Date  . ADENOIDECTOMY    . TONSILLECTOMY      OB History   None      Home Medications    Prior to Admission medications   Medication Sig Start Date End  Date Taking? Authorizing Provider  lamoTRIgine (LAMICTAL) 100 MG tablet Take 150 mg by mouth at bedtime.    Yes [provider]  levothyroxine (SYNTHROID, LEVOTHROID) 25 MCG tablet Take 25 mcg by mouth daily with breakfast. 05/16/16  Yes [provider]  liothyronine (CYTOMEL) 25 MCG tablet Take 25 mcg by mouth 3 (three) times daily.   Yes [provider]  sertraline (ZOLOFT) 100 MG tablet Take 200 mg by mouth at bedtime.    Yes [provider]  TRI-LINYAH 0.18/0.215/0.25 MG-35 MCG tablet Take 1 tablet by mouth daily. 06/11/16  Yes [provider]  aspirin-acetaminophen-caffeine (EXCEDRIN MIGRAINE) [phone] MG tablet Take 2 tablets by mouth every 6 (six) hours as needed for headache.    [provider]  B Complex-Folic Acid (SUPER B COMPLEX MAXI) TABS Take 1 tablet by mouth daily with breakfast.    [provider]  Cholecalciferol (VITAMIN D3) 5000 units TABS Take 1 tablet by mouth daily.    [provider]  Coenzyme Q10-Fish Oil-Vit E (CO-Q 10 OMEGA-3 FISH OIL) CAPS Take 2 capsules by mouth daily.    [provider]  dicyclomine (BENTYL) 20 MG tablet Take 1 tablet (20 mg total) by mouth 3 (three) times daily before meals. 03/10/18   Evon Slack, PA-C  ibuprofen (ADVIL,MOTRIN) 200 MG  tablet Take 400 mg by mouth every 6 (six) hours as needed for fever, headache, mild pain, moderate pain or cramping. For pain     [provider]  methylPREDNISolone (MEDROL DOSEPAK) 4 MG TBPK tablet As directed Patient not taking: Reported on 10/24/2016 09/11/16   Elvina Sidle, MD  omeprazole (PRILOSEC) 20 MG capsule Take 1 capsule (20 mg total) by mouth daily. 03/10/18   Evon Slack, PA-C  traMADol (ULTRAM) 50 MG tablet Take 50 mg by mouth every 6 (six) hours as needed for moderate pain.    [provider]  valACYclovir (VALTREX) 1000 MG tablet Take 4,000 mg by mouth at bedtime.  05/16/16   [provider]    Family History History reviewed. No pertinent family history.  Social History Social History   Tobacco Use  . Smoking status: Never Smoker  . Smokeless tobacco: Never Used  Substance Use Topics  . Alcohol use: Yes    Comment: Occasionally   . Drug use: No     Allergies   Diphenhydramine; Levaquin [levofloxacin]; Percocet [oxycodone-acetaminophen]; Cefaclor; Tramadol; Hydroxyzine; Other; Sunflower oil; Clindamycin/lincomycin; Codeine; Keflex [cephalexin]; Morphine and related; Sulfonamide derivatives; and Zofran [ondansetron]   Review of Systems Review of Systems  Constitutional: Negative for fever.  HENT: Negative for congestion, rhinorrhea, sinus pressure, sinus pain, sore throat, trouble swallowing and voice change.   Respiratory: Negative for cough, shortness of breath, wheezing and stridor.   Cardiovascular: Positive for chest pain. Negative for leg swelling.  Gastrointestinal: Negative for abdominal pain, diarrhea, nausea and vomiting.  Genitourinary: Negative for dysuria, flank pain and pelvic pain.  Musculoskeletal: Negative for back pain and myalgias.  Skin: Negative for rash.  Neurological: Positive for numbness. Negative for dizziness, weakness and headaches.  Psychiatric/Behavioral: The patient is nervous/anxious.      Physical Exam Triage Vital Signs ED Triage Vitals  Enc Vitals Group     BP 03/10/18 1627 (!) 143/100     Pulse Rate 03/10/18 1627 (!) 108     Resp --      Temp 03/10/18 1627 98.2 F (36.8 C)     Temp Source 03/10/18 1627 Oral     SpO2 03/10/18 1627 100 %     Weight --      Height --      Head Circumference --      Peak Flow --      Pain Score 03/10/18 1628 7     Pain Loc --      Pain Edu? --      Excl. in GC? --    No data found.  Updated Vital Signs BP 135/88   Pulse 80   Temp 98.2 F (36.8 C) (Oral)   LMP 03/04/2018 (Exact Date)   SpO2 98%   Visual Acuity Right Eye Distance:   Left Eye Distance:   Bilateral  Distance:    Right Eye Near:   Left Eye Near:    Bilateral Near:     Physical Exam  Constitutional: She is oriented to person, place, and time. She appears well-developed and well-nourished. No distress.  HENT:  Head: Normocephalic and atraumatic.  Right Ear: Hearing, tympanic membrane, external ear and ear canal normal.  Left Ear: Hearing, tympanic membrane, external ear and ear canal normal.  Nose: Rhinorrhea present.  Mouth/Throat: Mucous membranes are normal. No trismus in the jaw. No uvula swelling. Posterior oropharyngeal erythema present. No oropharyngeal exudate, posterior oropharyngeal edema or tonsillar abscesses. No tonsillar exudate.  Eyes: Conjunctivae are  normal. Right eye exhibits no discharge. Left eye exhibits no discharge.  Neck: Normal range of motion.  Cardiovascular: Normal rate, regular rhythm and normal heart sounds.  Pulmonary/Chest: Effort normal and breath sounds normal. No stridor. No respiratory distress. She has no wheezes. She has no rales. She exhibits tenderness.  Abdominal: Soft. She exhibits no distension. There is no tenderness.  Musculoskeletal: Normal range of motion. She exhibits no deformity.  No lower extremity swelling or edema.  Lymphadenopathy:    She has cervical adenopathy.  Neurological: She is alert and oriented to person, place, and time. She has normal reflexes.  Skin: Skin is warm and dry.  Psychiatric: She has a normal mood and affect. Her behavior is normal. Thought content normal.     UC Treatments / Results  Labs (all labs ordered are listed, but only abnormal results are displayed) Labs Reviewed - No data to display  EKG ED ECG REPORT   Date: 03/10/2018  EKG Time: 6:48 PM  Rate: 79  Rhythm: normal sinus rhythm,  normal EKG, normal sinus rhythm  Axis: 43/49/42  Intervals:none  ST&T Change: none  Narrative Interpretation: Normal EKG             Radiology No results found.  Procedures Procedures (including  critical care time)  Medications Ordered in UC Medications - No data to display  Initial Impression / Assessment and Plan / UC Course  I have reviewed the triage vital signs and the nursing notes.  Pertinent labs & imaging results that were available during my care of the patient were reviewed by me and considered in my medical decision making (see chart for details).   27 year old female with atypical chest pain.  Patient with sharp chest pain, burning, pain increased after eating.  Pain is been present over the last few days.  No relief with Zantac, Alka-Seltzer, Tums.  Will place patient on Bentyl and proton pump inhibitor.  Vital signs are stable.  Patient's low risk factor for PE.  Patient is educated on signs and symptoms return to the clinic or ED for.  She will follow-up PCP in 2 to 3 days if no improvement.  Final Clinical Impressions(s) / UC Diagnoses   Final diagnoses:  Atypical chest pain     Discharge Instructions     Please take medications as prescribed.  Review bland diet paperwork and avoid foods that increase acid production.  If any increasing chest pain, nausea, vomiting, fevers, return to the clinic or ED.   ED Prescriptions    Medication Sig Dispense Auth. Provider   dicyclomine (BENTYL) 20 MG tablet Take 1 tablet (20 mg total) by mouth 3 (three) times daily before meals. 30 tablet Evon Slack, PA-C   omeprazole (PRILOSEC) 20 MG capsule Take 1 capsule (20 mg total) by mouth daily. 30 capsule Ronnette Juniper       Evon Slack, [place] 03/10/18 1851

## 2018-07-21 ENCOUNTER — Ambulatory Visit
Admission: RE | Admit: 2018-07-21 | Discharge: 2018-07-21 | Disposition: A | Discharge status: Home / Self Care | Payer: BLUE CROSS/BLUE SHIELD | Source: Ambulatory Visit | Attending: Obstetrics and Gynecology | Admitting: Obstetrics and Gynecology

## 2018-07-21 ENCOUNTER — Other Ambulatory Visit: Payer: Self-pay | Admitting: Obstetrics and Gynecology

## 2018-07-21 DIAGNOSIS — N6320 Unspecified lump in the left breast, unspecified quadrant: Secondary | ICD-10-CM

## 2018-08-03 ENCOUNTER — Encounter (HOSPITAL_COMMUNITY): Payer: Self-pay

## 2018-08-03 ENCOUNTER — Ambulatory Visit (HOSPITAL_COMMUNITY)
Admission: EM | Admit: 2018-08-03 | Discharge: 2018-08-03 | Disposition: A | Discharge status: Home / Self Care | Payer: BLUE CROSS/BLUE SHIELD | Attending: Internal Medicine | Admitting: Internal Medicine

## 2018-08-03 DIAGNOSIS — M5441 Lumbago with sciatica, right side: Secondary | ICD-10-CM

## 2018-08-03 MED ORDER — CYCLOBENZAPRINE HCL 5 MG PO TABS: 5.0000 mg | ORAL_TABLET | Freq: Every day | ORAL | 0 refills | Status: DC

## 2018-08-03 MED ORDER — METHYLPREDNISOLONE ACETATE 80 MG/ML IJ SUSP
INTRAMUSCULAR | Status: AC
  Filled 2018-08-03: qty 1

## 2018-08-03 MED ORDER — MELOXICAM 15 MG PO TABS: 15.0000 mg | ORAL_TABLET | Freq: Every day | ORAL | 0 refills | Status: DC

## 2018-08-03 MED ORDER — METHYLPREDNISOLONE ACETATE 80 MG/ML IJ SUSP
80.0000 mg | Freq: Once | INTRAMUSCULAR | Status: AC
  Administered 2018-08-03: 80 mg via INTRAMUSCULAR

## 2018-08-03 NOTE — Discharge Instructions (Signed)
Light and regular activity as tolerated.  Meloxicam daily, take with food. Don't take additional ibuprofen.  Flexeril at night as muscle relaxer. May cause drowsiness. Please do not take if driving or drinking alcohol.   Continue with heat/ice application as well as TENs unit.  Sleep with pillows under the knees.  Continue to follow up with your primary care provider for further evaluation and management if symptoms persist.

## 2018-08-03 NOTE — ED Triage Notes (Signed)
Pt presents with persistent ongoing back pain that radiates down her leg.

## 2018-08-03 NOTE — ED Provider Notes (Signed)
[facility]    CSN: [id number] Arrival date & time: 08/03/18  1008     History   Chief Complaint Chief Complaint  Patient presents with  . Back Pain    HPI Dawn King is a 27 y.o. female.   Dawn King presents with low back pain which has been worsening since 9/17. No specific injury but states she is on her feet a lot at work which seems to worsen it. No numbness tingling or weakness to the extremities. No saddle paresthesia. No loss of bladder or bowel function. States has had similar in the past but usually ibuprofen and use of her TENs unit helps, but they have not. Has also tried heat, ice, naproxen, and hydrocodone which have minimally helped. Went to another UC yesterday and was provided with some hydrocodone. Pain radiates down right leg at times. Pain is severe, worse with certain movements and positions but can be constant. Hx of anxiety, concussion, pneumonia, mono.     ROS per HPI.      Past Medical History:  Diagnosis Date  . Anxiety   . H/O multiple concussions   . Pneumonia     Patient Active Problem List   Diagnosis Date Noted  . MONONUCLEOSIS 05/23/2009  . LEUKOCYTOSIS 05/23/2009  . CARDIAC MURMUR 05/23/2009  . PERITONSILLAR ABSCESS 04/28/2009    Past Surgical History:  Procedure Laterality Date  . ADENOIDECTOMY    . TONSILLECTOMY      OB History   None      Home Medications    Prior to Admission medications   Medication Sig Start Date End Date Taking? Authorizing Provider  aspirin-acetaminophen-caffeine (EXCEDRIN MIGRAINE) [phone] MG tablet Take 2 tablets by mouth every 6 (six) hours as needed for headache.    [provider]  B Complex-Folic Acid (SUPER B COMPLEX MAXI) TABS Take 1 tablet by mouth daily with breakfast.    [provider]  Cholecalciferol (VITAMIN D3) 5000 units TABS Take 1 tablet by mouth daily.    [provider]  Coenzyme Q10-Fish Oil-Vit E (CO-Q 10 OMEGA-3 FISH OIL) CAPS  Take 2 capsules by mouth daily.    [provider]  cyclobenzaprine (FLEXERIL) 5 MG tablet Take 1 tablet (5 mg total) by mouth at bedtime. 08/03/18   Georgetta Haber, NP  dicyclomine (BENTYL) 20 MG tablet Take 1 tablet (20 mg total) by mouth 3 (three) times daily before meals. 03/10/18   Evon Slack, PA-C  ibuprofen (ADVIL,MOTRIN) 200 MG tablet Take 400 mg by mouth every 6 (six) hours as needed for fever, headache, mild pain, moderate pain or cramping. For pain     [provider]  lamoTRIgine (LAMICTAL) 100 MG tablet Take 150 mg by mouth at bedtime.     [provider]  levothyroxine (SYNTHROID, LEVOTHROID) 25 MCG tablet Take 25 mcg by mouth daily with breakfast. 05/16/16   [provider]  liothyronine (CYTOMEL) 25 MCG tablet Take 25 mcg by mouth 3 (three) times daily.    [provider]  meloxicam (MOBIC) 15 MG tablet Take 1 tablet (15 mg total) by mouth daily. 08/03/18   Georgetta Haber, NP  omeprazole (PRILOSEC) 20 MG capsule Take 1 capsule (20 mg total) by mouth daily. 03/10/18   Evon Slack, PA-C  sertraline (ZOLOFT) 100 MG tablet Take 200 mg by mouth at bedtime.     [provider]  TRI-LINYAH 0.18/0.215/0.25 MG-35 MCG tablet Take 1 tablet by mouth daily. 06/11/16  [provider]  valACYclovir (VALTREX) 1000 MG tablet Take 4,000 mg by mouth at bedtime.  05/16/16   [provider]    Family History History reviewed. No pertinent family history.  Social History Social History   Tobacco Use  . Smoking status: Never Smoker  . Smokeless tobacco: Never Used  Substance Use Topics  . Alcohol use: Yes    Comment: Occasionally   . Drug use: No     Allergies   Diphenhydramine; Levaquin [levofloxacin]; Percocet [oxycodone-acetaminophen]; Cefaclor; Tramadol; Hydroxyzine; Other; Sunflower oil; Clindamycin/lincomycin; Codeine; Keflex [cephalexin]; Morphine and related; Sulfonamide derivatives; and Zofran  [ondansetron]   Review of Systems Review of Systems   Physical Exam Triage Vital Signs ED Triage Vitals [08/03/18 1045]  Enc Vitals Group     BP 123/73     Pulse Rate 82     Resp 20     Temp 97.7 F (36.5 C)     Temp Source Oral     SpO2 98 %     Weight      Height      Head Circumference      Peak Flow      Pain Score      Pain Loc      Pain Edu?      Excl. in GC?    No data found.  Updated Vital Signs BP 123/73 (BP Location: Right Arm)   Pulse 82   Temp 97.7 F (36.5 C) (Oral)   Resp 20   LMP 07/16/2018   SpO2 98%   Visual Acuity Right Eye Distance:   Left Eye Distance:   Bilateral Distance:    Right Eye Near:   Left Eye Near:    Bilateral Near:     Physical Exam  Constitutional: She is oriented to person, place, and time. She appears well-developed and well-nourished. No distress.  Cardiovascular: Normal rate, regular rhythm and normal heart sounds.  Pulmonary/Chest: Effort normal and breath sounds normal.  Musculoskeletal:       Lumbar back: She exhibits tenderness and pain. She exhibits no bony tenderness, no swelling, no edema, no deformity, no laceration, no spasm and normal pulse.       Back:  Right sided low back pain on palpation; pain to right low back with bilaterally hip flexion and straight leg raise without radiation of pain to buttocks; strength equal bilaterally; gross sensation intact to lower extremities; ambulatory but with pain noted; noted pain with transition from sit to lay and lay to sit  Neurological: She is alert and oriented to person, place, and time.  Skin: Skin is warm and dry.     UC Treatments / Results  Labs (all labs ordered are listed, but only abnormal results are displayed) Labs Reviewed - No data to display  EKG None  Radiology No results found.  Procedures Procedures (including critical care time)  Medications Ordered in UC Medications  methylPREDNISolone acetate (DEPO-MEDROL) injection 80 mg (has no  administration in time range)    Initial Impression / Assessment and Plan / UC Course  I have reviewed the triage vital signs and the nursing notes.  Pertinent labs & imaging results that were available during my care of the patient were reviewed by me and considered in my medical decision making (see chart for details).     Low back pain with sciatica present; no acute red flag findings. depomedrol im provided here today and started meloxicam daily. Flexeril at night with drowsy precautions provided. Encouraged  follow up with PCP as may need further evaluation and treatment is symptoms persist. Patient verbalized understanding and agreeable to plan.    Final Clinical Impressions(s) / UC Diagnoses   Final diagnoses:  Right-sided low back pain with right-sided sciatica, unspecified chronicity     Discharge Instructions     Light and regular activity as tolerated.  Meloxicam daily, take with food. Don't take additional ibuprofen.  Flexeril at night as muscle relaxer. May cause drowsiness. Please do not take if driving or drinking alcohol.   Continue with heat/ice application as well as TENs unit.  Sleep with pillows under the knees.  Continue to follow up with your primary care provider for further evaluation and management if symptoms persist.    ED Prescriptions    Medication Sig Dispense Auth. Provider   meloxicam (MOBIC) 15 MG tablet Take 1 tablet (15 mg total) by mouth daily. 30 tablet Linus Mako B, NP   cyclobenzaprine (FLEXERIL) 5 MG tablet Take 1 tablet (5 mg total) by mouth at bedtime. 15 tablet Georgetta Haber, NP     Controlled Substance Prescriptions [place] Controlled Substance Registry consulted? Not Applicable   Georgetta Haber, NP 08/03/18 1127

## 2018-11-07 ENCOUNTER — Other Ambulatory Visit: Payer: Self-pay

## 2018-11-07 ENCOUNTER — Emergency Department (HOSPITAL_COMMUNITY)
Admission: EM | Admit: 2018-11-07 | Discharge: 2018-11-08 | Disposition: A | Discharge status: Other Acute Inpatient Hospital | Payer: BLUE CROSS/BLUE SHIELD | Attending: Emergency Medicine | Admitting: Emergency Medicine

## 2018-11-07 ENCOUNTER — Encounter (HOSPITAL_COMMUNITY): Payer: Self-pay | Admitting: *Deleted

## 2018-11-07 ENCOUNTER — Ambulatory Visit (HOSPITAL_COMMUNITY)
Admission: EM | Admit: 2018-11-07 | Discharge: 2018-11-07 | Disposition: A | Discharge status: Home / Self Care | Payer: BLUE CROSS/BLUE SHIELD | Attending: Family Medicine | Admitting: Family Medicine

## 2018-11-07 ENCOUNTER — Encounter (HOSPITAL_COMMUNITY): Payer: Self-pay | Admitting: Emergency Medicine

## 2018-11-07 DIAGNOSIS — F419 Anxiety disorder, unspecified: Secondary | ICD-10-CM

## 2018-11-07 DIAGNOSIS — Z79899 Other long term (current) drug therapy: Secondary | ICD-10-CM | POA: Diagnosis not present

## 2018-11-07 DIAGNOSIS — F332 Major depressive disorder, recurrent severe without psychotic features: Secondary | ICD-10-CM | POA: Insufficient documentation

## 2018-11-07 DIAGNOSIS — F329 Major depressive disorder, single episode, unspecified: Secondary | ICD-10-CM

## 2018-11-07 DIAGNOSIS — F32A Depression, unspecified: Secondary | ICD-10-CM

## 2018-11-07 DIAGNOSIS — Z7982 Long term (current) use of aspirin: Secondary | ICD-10-CM | POA: Diagnosis not present

## 2018-11-07 HISTORY — DX: Major depressive disorder, single episode, unspecified: F32.9

## 2018-11-07 HISTORY — DX: Depression, unspecified: F32.A

## 2018-11-07 LAB — CBC
HCT: 40.2 % (ref 36.0–46.0)
Hemoglobin: 12.6 g/dL (ref 12.0–15.0)
MCH: 23.6 pg — ABNORMAL LOW (ref 26.0–34.0)
MCHC: 31.3 g/dL (ref 30.0–36.0)
MCV: 75.4 fL — ABNORMAL LOW (ref 80.0–100.0)
Platelets: 428 10*3/uL — ABNORMAL HIGH (ref 150–400)
RBC: 5.33 MIL/uL — ABNORMAL HIGH (ref 3.87–5.11)
RDW: 15.2 % (ref 11.5–15.5)
WBC: 16.2 10*3/uL — ABNORMAL HIGH (ref 4.0–10.5)
nRBC: 0 % (ref 0.0–0.2)

## 2018-11-07 LAB — RAPID URINE DRUG SCREEN, HOSP PERFORMED
Amphetamines: NOT DETECTED
Barbiturates: NOT DETECTED
Benzodiazepines: POSITIVE — AB
Cocaine: NOT DETECTED
Opiates: NOT DETECTED
Tetrahydrocannabinol: NOT DETECTED

## 2018-11-07 LAB — ETHANOL: Alcohol, Ethyl (B): <10 mg/dL (ref <10)

## 2018-11-07 LAB — COMPREHENSIVE METABOLIC PANEL
ALT: 12 U/L (ref 0–44)
AST: 12 U/L — ABNORMAL LOW (ref 15–41)
Albumin: 4 g/dL (ref 3.5–5.0)
Alkaline Phosphatase: 73 U/L (ref 38–126)
Anion gap: 12 (ref 5–15)
BUN: 8 mg/dL (ref 6–20)
CO2: 22 mmol/L (ref 22–32)
Calcium: 9.1 mg/dL (ref 8.9–10.3)
Chloride: 104 mmol/L (ref 98–111)
Creatinine, Ser: 0.68 mg/dL (ref 0.44–1.00)
GFR calc Af Amer: >60 mL/min (ref >60)
GFR calc non Af Amer: >60 mL/min (ref >60)
Glucose, Bld: 91 mg/dL (ref 70–99)
Potassium: 3.6 mmol/L (ref 3.5–5.1)
Sodium: 138 mmol/L (ref 135–145)
Total Bilirubin: 0.7 mg/dL (ref 0.3–1.2)
Total Protein: 8.3 g/dL — ABNORMAL HIGH (ref 6.5–8.1)

## 2018-11-07 LAB — PREGNANCY, URINE: Preg Test, Ur: NEGATIVE

## 2018-11-07 MED ORDER — IBUPROFEN 200 MG PO TABS: 600.0000 mg | ORAL_TABLET | Freq: Three times a day (TID) | ORAL | Status: DC | PRN

## 2018-11-07 MED ORDER — ALUM & MAG HYDROXIDE-SIMETH 200-200-20 MG/5ML PO SUSP: 30.0000 mL | Freq: Four times a day (QID) | ORAL | Status: DC | PRN

## 2018-11-07 NOTE — ED Notes (Signed)
Pt alert and oriented. Pt cooperative and tearful. Pt states " I dont want to be back here I feel trapped". Pt denies any si,hi, and avh at this time. Pt currently being assessed by TTS. Pt safe will continue to monitor.

## 2018-11-07 NOTE — ED Provider Notes (Signed)
[facility]-EMERGENCY DEPT Provider Note   CSN: [id number] Arrival date & time: 11/07/18  2112     History   Chief Complaint Chief Complaint  Patient presents with  . Depression  . Anxiety    HPI Dawn King is a 27 y.o. female who presents from the [facility] urgent care for anxiety and depression. The patient states that she has been extremely anxious and unable to eat over the past 2 days. She ran out of her xanax because she doubled her dose for the past two days. She also took herself off of her tramadol which he takes chronically for back pain.  Earlier by Dr. Jordan LikesSchmitz at the urgent care who asked her to come here for evaluation.  She denies SI/HI.  She was afraid she might have serotonin syndrome so she did not take her Zoloft today.  She is a history of self cutting behavior.  I asked the patient if she felt like she needed to be admitted to a psychiatric hospital and she states "if I continue to feel this way, yes."  HPI  Past Medical History:  Diagnosis Date  . Anxiety   . Depression   . H/O multiple concussions   . Pneumonia     Patient Active Problem List   Diagnosis Date Noted  . MONONUCLEOSIS 05/23/2009  . LEUKOCYTOSIS 05/23/2009  . CARDIAC MURMUR 05/23/2009  . PERITONSILLAR ABSCESS 04/28/2009    Past Surgical History:  Procedure Laterality Date  . ADENOIDECTOMY    . TONSILLECTOMY       OB History   No obstetric history on file.      Home Medications    Prior to Admission medications   Medication Sig Start Date End Date Taking? Authorizing Provider  aspirin-acetaminophen-caffeine (EXCEDRIN MIGRAINE) [id number] MG tablet Take 2 tablets by mouth every 6 (six) hours as needed for headache.    [provider]  B Complex-Folic Acid (SUPER B COMPLEX MAXI) TABS Take 1 tablet by mouth daily with breakfast.    [provider]  Cholecalciferol (VITAMIN D3) 5000 units TABS Take 1 tablet by mouth daily.    [provider]  Coenzyme Q10-Fish Oil-Vit E (CO-Q 10 OMEGA-3 FISH OIL) CAPS Take 2 capsules by mouth daily.    [provider]  cyclobenzaprine (FLEXERIL) 5 MG tablet Take 1 tablet (5 mg total) by mouth at bedtime. Patient not taking: Reported on 11/07/2018 08/03/18   Georgetta HaberBurky, Natalie B, NP  dicyclomine (BENTYL) 20 MG tablet Take 1 tablet (20 mg total) by mouth 3 (three) times daily before meals. Patient not taking: Reported on 11/07/2018 03/10/18   Evon SlackGaines, Thomas C, PA-C  ibuprofen (ADVIL,MOTRIN) 200 MG tablet Take 400 mg by mouth every 6 (six) hours as needed for fever, headache, mild pain, moderate pain or cramping. For pain     [provider]  lamoTRIgine (LAMICTAL) 100 MG tablet Take 150 mg by mouth at bedtime.     [provider]  levothyroxine (SYNTHROID, LEVOTHROID) 25 MCG tablet Take 25 mcg by mouth daily with breakfast. 05/16/16   [provider]  liothyronine (CYTOMEL) 25 MCG tablet Take 25 mcg by mouth 3 (three) times daily.    [provider]  meloxicam (MOBIC) 15 MG tablet Take 1 tablet (15 mg total) by mouth daily. 08/03/18   Georgetta HaberBurky, Natalie B, NP  omeprazole (PRILOSEC) 20 MG capsule Take 1 capsule (20 mg total) by mouth daily. Patient not taking: Reported on 11/07/2018 03/10/18   Floyce StakesGaines,  Thomas C, PA-C  sertraline (ZOLOFT) 100 MG tablet Take 200 mg by mouth at bedtime.     [provider]  TRI-LINYAH 0.18/0.215/0.25 MG-35 MCG tablet Take 1 tablet by mouth daily. 06/11/16   [provider]  valACYclovir (VALTREX) 1000 MG tablet Take 4,000 mg by mouth at bedtime.  05/16/16   [provider]    Family History No family history on file.  Social History Social History   Tobacco Use  . Smoking status: Never Smoker  . Smokeless tobacco: Never Used  Substance Use Topics  . Alcohol use: Yes    Comment: Occasionally   . Drug use: No     Allergies   Diphenhydramine; Levaquin [levofloxacin]; Percocet  [oxycodone-acetaminophen]; Cefaclor; Tramadol; Hydroxyzine; Other; Sunflower oil; Clindamycin/lincomycin; Codeine; Keflex [cephalexin]; Morphine and related; Sulfonamide derivatives; and Zofran [ondansetron]   Review of Systems Review of Systems Ten systems reviewed and are negative for acute change, except as noted in the HPI.    Physical Exam Updated Vital Signs BP (!) 138/98 (BP Location: Left Arm)   Pulse 90   Temp 98.9 F (37.2 C) (Oral)   Resp 18   LMP 11/06/2018   SpO2 99%   Physical Exam Vitals signs and nursing note reviewed.  Constitutional:      General: She is not in acute distress.    Appearance: She is well-developed. She is not diaphoretic.  HENT:     Head: Normocephalic and atraumatic.  Eyes:     General: No scleral icterus.    Conjunctiva/sclera: Conjunctivae normal.  Neck:     Musculoskeletal: Normal range of motion.  Cardiovascular:     Rate and Rhythm: Normal rate and regular rhythm.     Heart sounds: Normal heart sounds. No murmur. No friction rub. No gallop.   Pulmonary:     Effort: Pulmonary effort is normal. No respiratory distress.     Breath sounds: Normal breath sounds.  Abdominal:     General: Bowel sounds are normal. There is no distension.     Palpations: Abdomen is soft. There is no mass.     Tenderness: There is no abdominal tenderness. There is no guarding.  Skin:    General: Skin is warm and dry.  Neurological:     Mental Status: She is alert and oriented to person, place, and time.     Comments: No mild clonus or hyperreflexia.  No tremor when distracted  Psychiatric:        Mood and Affect: Mood is anxious.        Behavior: Behavior is agitated.     Comments: Tearful, pacing the room holding a baby blanket Hyperventilating and tremulous.  She is clutching her hands and wringing them      ED Treatments / Results  Labs (all labs ordered are listed, but only abnormal results are displayed) Labs Reviewed  COMPREHENSIVE  METABOLIC PANEL  ETHANOL  CBC  RAPID URINE DRUG SCREEN, HOSP PERFORMED  PREGNANCY, URINE    EKG None  Radiology No results found.  Procedures Procedures (including critical care time)  Medications Ordered in ED Medications - No data to display   Initial Impression / Assessment and Plan / ED Course  I have reviewed the triage vital signs and the nursing notes.  Pertinent labs & imaging results that were available during my care of the patient were reviewed by me and considered in my medical decision making (see chart for details).     Patient here with anxiety.  She has no physical exam signs suggestive of serotonin syndrome.  I have a high suspicion for cluster B personality traits.  Feel that this likely represents anxiety however patient and her mother are insistent on psychiatric evaluation this evening.  Patient denies suicidal or homicidal ideation.  She has multiple drug allergies (greater than 11) including hydroxyzine and Benadryl.  Will encourage patient to sit in a dark quiet room for emotional calming effect.   Patient medically clear Final Clinical Impressions(s) / ED Diagnoses   Final diagnoses:  None    ED Discharge Orders    None       Arthor Captain, PA-C 11/07/18 2304    Margarita Grizzle, MD 11/11/18 [phone]

## 2018-11-07 NOTE — ED Provider Notes (Signed)
[facility]    CSN: [id number] Arrival date & time: 11/07/18  1918     History   Chief Complaint Chief Complaint  Patient presents with  . Anxiety    HPI Dawn King is a 27 y.o. female.   She has a history with anxiety.  She is presenting with acute anxiety and symptoms suggestive of mania.  She has a history of bipolar as well.  She takes Zoloft and Lamictal.  On Christmas Day she was having a high migraine and was provided Imitrex.  Since that time she has been able to sleep more than 2 hours.  She has not felt like her normal self.  She has ongoing headaches and feels warm.  Has not had a recorded fever.  She feels like her anxiety is completely uncontrollable.  She has taken Xanax in order to sleep.  She has concerns of having serotonin syndrome.  She is currently out of her Xanax.  She does not feel comfortable being left alone.  She denies SI but is fearful that if she is left alone and something may happen.  She denies having a plan.  She denies taking any extra doses of her low normal medication.  Denies any recent illnesses.  Has not taken tramadol in about 5 or 6 days for her low back pain.  HPI  Past Medical History:  Diagnosis Date  . Anxiety   . H/O multiple concussions   . Pneumonia     Patient Active Problem List   Diagnosis Date Noted  . MONONUCLEOSIS 05/23/2009  . LEUKOCYTOSIS 05/23/2009  . CARDIAC MURMUR 05/23/2009  . PERITONSILLAR ABSCESS 04/28/2009    Past Surgical History:  Procedure Laterality Date  . ADENOIDECTOMY    . TONSILLECTOMY      OB History   No obstetric history on file.      Home Medications    Prior to Admission medications   Medication Sig Start Date End Date Taking? Authorizing Provider  aspirin-acetaminophen-caffeine (EXCEDRIN MIGRAINE) [phone] MG tablet Take 2 tablets by mouth every 6 (six) hours as needed for headache.    [provider]  B Complex-Folic Acid (SUPER B COMPLEX MAXI) TABS Take 1  tablet by mouth daily with breakfast.    [provider]  Cholecalciferol (VITAMIN D3) 5000 units TABS Take 1 tablet by mouth daily.    [provider]  Coenzyme Q10-Fish Oil-Vit E (CO-Q 10 OMEGA-3 FISH OIL) CAPS Take 2 capsules by mouth daily.    [provider]  cyclobenzaprine (FLEXERIL) 5 MG tablet Take 1 tablet (5 mg total) by mouth at bedtime. Patient not taking: Reported on 11/07/2018 08/03/18   Georgetta Haber, NP  dicyclomine (BENTYL) 20 MG tablet Take 1 tablet (20 mg total) by mouth 3 (three) times daily before meals. Patient not taking: Reported on 11/07/2018 03/10/18   Evon Slack, PA-C  ibuprofen (ADVIL,MOTRIN) 200 MG tablet Take 400 mg by mouth every 6 (six) hours as needed for fever, headache, mild pain, moderate pain or cramping. For pain     [provider]  lamoTRIgine (LAMICTAL) 100 MG tablet Take 150 mg by mouth at bedtime.     [provider]  levothyroxine (SYNTHROID, LEVOTHROID) 25 MCG tablet Take 25 mcg by mouth daily with breakfast. 05/16/16   [provider]  liothyronine (CYTOMEL) 25 MCG tablet Take 25 mcg by mouth 3 (three) times daily.    [provider]  meloxicam (MOBIC) 15 MG tablet Take 1  tablet (15 mg total) by mouth daily. 08/03/18   Georgetta HaberBurky, Natalie B, NP  omeprazole (PRILOSEC) 20 MG capsule Take 1 capsule (20 mg total) by mouth daily. Patient not taking: Reported on 11/07/2018 03/10/18   Evon SlackGaines, Thomas C, PA-C  sertraline (ZOLOFT) 100 MG tablet Take 200 mg by mouth at bedtime.     [provider]  TRI-LINYAH 0.18/0.215/0.25 MG-35 MCG tablet Take 1 tablet by mouth daily. 06/11/16   [provider]  valACYclovir (VALTREX) 1000 MG tablet Take 4,000 mg by mouth at bedtime.  05/16/16   [provider]    Family History No family history on file.  Social History Social History   Tobacco Use  . Smoking status: Never Smoker  . Smokeless tobacco: Never Used  Substance Use  Topics  . Alcohol use: Yes    Comment: Occasionally   . Drug use: No     Allergies   Diphenhydramine; Levaquin [levofloxacin]; Percocet [oxycodone-acetaminophen]; Cefaclor; Tramadol; Hydroxyzine; Other; Sunflower oil; Clindamycin/lincomycin; Codeine; Keflex [cephalexin]; Morphine and related; Sulfonamide derivatives; and Zofran [ondansetron]   Review of Systems Review of Systems  Constitutional: Positive for activity change. Negative for fever.  HENT: Negative for congestion.   Respiratory: Positive for shortness of breath. Negative for cough.   Cardiovascular: Negative for chest pain.  Gastrointestinal: Negative for abdominal pain.  Musculoskeletal: Positive for back pain.  Skin: Negative for color change.  Neurological: Negative for weakness.  Hematological: Negative for adenopathy.  Psychiatric/Behavioral: Positive for agitation. The patient is nervous/anxious and is hyperactive.      Physical Exam Triage Vital Signs ED Triage Vitals  Enc Vitals Group     BP 11/07/18 1954 134/81     Pulse Rate 11/07/18 1954 99     Resp 11/07/18 1954 18     Temp 11/07/18 1954 98.3 F (36.8 C)     Temp src --      SpO2 11/07/18 1954 99 %     Weight --      Height --      Head Circumference --      Peak Flow --      Pain Score 11/07/18 1958 0     Pain Loc --      Pain Edu? --      Excl. in GC? --    No data found.  Updated Vital Signs BP 134/81   Pulse 99   Temp 98.3 F (36.8 C)   Resp 18   LMP 11/07/2018   SpO2 99%   Visual Acuity Right Eye Distance:   Left Eye Distance:   Bilateral Distance:    Right Eye Near:   Left Eye Near:    Bilateral Near:     Physical Exam Gen: NAD, alert, cooperative with exam,  ENT: normal lips, normal nasal mucosa,  Eye: normal EOM, normal conjunctiva and lids CV:  no edema, +2 pedal pulses   Resp: no accessory muscle use, non-labored,  Skin: no rashes, no areas of induration  Neuro: normal tone, normal sensation to touch Psych:   normal insight, alert and oriented MSK: no clonus appreciated in her feet.  Patellar tendon reflexes are symmetric +3    UC Treatments / Results  Labs (all labs ordered are listed, but only abnormal results are displayed) Labs Reviewed - No data to display  EKG None  Radiology No results found.  Procedures Procedures (including critical care time)  Medications Ordered in UC Medications - No data to display  Initial Impression / Assessment  and Plan / UC Course  I have reviewed the triage vital signs and the nursing notes.  Pertinent labs & imaging results that were available during my care of the patient were reviewed by me and considered in my medical decision making (see chart for details).     Percell Beltriana is a 27 year old female that is presenting with acute mania most likely.  She does take Zoloft, Lamictal and has had recently taken Imitrex.  Less likely for serotonin syndrome as she is afebrile currently.  She does not demonstrate any clonus.  She does have significant anxiety with being left alone and cannot eat or get any sleep without the use of medications.  She does not seem to be a harm to herself but that may happen if she is left alone and her symptoms are controlled.  Had a discussion with her and her mother about the best course of action.  Agree that [facility] would most likely benefit her in terms of her anxiety as well as her overall health.  They were agreeable to this plan and the mother will [facility].  Final Clinical Impressions(s) / UC Diagnoses   Final diagnoses:  Anxiety     Discharge Instructions     [facility].      ED Prescriptions    None     Controlled Substance Prescriptions [place] Controlled Substance Registry consulted? Not Applicable   Myra RudeSchmitz, Egbert Seidel E, MD 11/07/18 2112
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
## 2018-11-07 NOTE — Discharge Instructions (Addendum)
Follow up with your doctor for ongoing outpatient management of anxiety and depression

## 2018-11-07 NOTE — ED Triage Notes (Addendum)
Pt is crying, states her anxiety is making her shake uncontrollably, this nurse had difficulty understanding the pt initially, but eventually, pt would stop shaking when she is distracted.  It looks like pt have been at [place] tonight.  She is cooperative.  She denies SI, states "not if I can get this shakes to stop."  She endorses not being able to sleep at night unless she takes xanax which she ran out today and is not able to get a refill until Sunday.

## 2018-11-07 NOTE — ED Provider Notes (Signed)
Patient here for evaluation of anxiety and depression. No HI/SI/AVH. She has been evaluated by TTS and is felt appropriate for discharge home. She will follow up with her doctor for ongoing outpatient management.    Elpidio AnisUpstill, Khoen Genet, PA-C 11/07/18 2358    Margarita Grizzleay, Danielle, MD 11/11/18 [phone]

## 2018-11-07 NOTE — Discharge Instructions (Signed)
[facility] department.

## 2018-11-07 NOTE — ED Triage Notes (Signed)
Pt is tearful in triage. Pt states "I think I might have seratonin syndrome, I take Zoloft and Lamictal every day, i've had a migraine for a day on christmas and my dad gave me some immitrex on christmas day. After I started taking it I felt anxious and it felt like all the muscles in my body 'seized up' and my entire body felt like it was achy. I went to bed and woke up with anxiety and i've been taking xanax and i'm out of xanax." Mother also endorses diarrhea. Pt mother states she had a fever as well.

## 2018-11-07 NOTE — ED Notes (Signed)
Pt denies SI/HI at this time. States "I just want it to stop".

## 2018-11-08 NOTE — ED Notes (Signed)
Pt discharge instructions reviewed . Pt verbalizes an understanding of instructions. Pt states " I have no questions". Pt stable and safe.Pt discharged home with family.

## 2018-11-26 ENCOUNTER — Other Ambulatory Visit: Payer: Self-pay | Admitting: Neurosurgery

## 2018-12-08 NOTE — Pre-Procedure Instructions (Addendum)
Dawn King [facility]  12/08/2018      [facility] - [place], [place] - [address] [place] [address] Phone: [phone] Fax: [phone]  [id number] [place], [place] - [address] AT [facility] [address] [place] [address] Phone: [phone] Fax: [phone]    Your procedure is scheduled on Monday, February 3rd.  Report to [facility] Admitting at 8:50 A.M.  Call this number if you have problems the morning of surgery:  [phone]   Remember:  Do not eat or drink after midnight.    Take these medicines the morning of surgery with A SIP OF WATER  traMADol (ULTRAM)  As of today, STOP taking any Aspirin (unless otherwise instructed by your surgeon), Aleve, Naproxen, Ibuprofen, Motrin, Advil, Goody's, BC's, all herbal medications, fish oil, and all vitamins.   Do not wear jewelry, make-up or nail polish.  Do not wear lotions, powders, or perfumes, or deodorant.  Do not shave 48 hours prior to surgery.    Do not bring valuables to the hospital.  [facility] is not responsible for any belongings or valuables.  Contacts, dentures or bridgework may not be worn into surgery.  Leave your suitcase in the car.  After surgery it may be brought to your room.  For patients admitted to the hospital, discharge time will be determined by your treatment team.  Patients discharged the day of surgery will not be allowed to drive home.   Special instructions:   [facility]- Preparing For Surgery  Before surgery, you can play an important role. Because skin is not sterile, your skin needs to be as free of germs as possible. You can reduce the number of germs on your skin by washing with CHG (chlorahexidine gluconate) Soap before surgery.  CHG is an antiseptic cleaner which kills germs and bonds with the skin to continue killing germs even after washing.    Oral Hygiene is  also important to reduce your risk of infection.  Remember - BRUSH YOUR TEETH THE MORNING OF SURGERY WITH YOUR REGULAR TOOTHPASTE  Please do not use if you have an allergy to CHG or antibacterial soaps. If your skin becomes reddened/irritated stop using the CHG.  Do not shave (including legs and underarms) for at least 48 hours prior to first CHG shower. It is OK to shave your face.  Please follow these instructions carefully.   1. Shower the NIGHT BEFORE SURGERY and the MORNING OF SURGERY with CHG.   2. If you chose to wash your hair, wash your hair first as usual with your normal shampoo.  3. After you shampoo, rinse your hair and body thoroughly to remove the shampoo.  4. Use CHG as you would any other liquid soap. You can apply CHG directly to the skin and wash gently with a scrungie or a clean washcloth.   5. Apply the CHG Soap to your body ONLY FROM THE NECK DOWN.  Do not use on open wounds or open sores. Avoid contact with your eyes, ears, mouth and genitals (private parts). Wash Face and genitals (private parts)  with your normal soap.  6. Wash thoroughly, paying special attention to the area where your surgery will be performed.  7. Thoroughly rinse your body with warm water from the neck down.  8. DO NOT shower/wash with your normal soap after using and rinsing off the  CHG Soap.  9. Pat yourself dry with a CLEAN TOWEL.  10. Wear CLEAN PAJAMAS to bed the night before surgery, wear comfortable clothes the morning of surgery  11. Place CLEAN SHEETS on your bed the night of your first shower and DO NOT SLEEP WITH PETS.    Day of Surgery:  Do not apply any deodorants/lotions.  Please wear clean clothes to the hospital/surgery center.   Remember to brush your teeth WITH YOUR REGULAR TOOTHPASTE.   Please read over the following fact sheets that you were given.

## 2018-12-09 ENCOUNTER — Other Ambulatory Visit: Payer: Self-pay

## 2018-12-09 ENCOUNTER — Encounter (HOSPITAL_COMMUNITY)
Admission: RE | Admit: 2018-12-09 | Discharge: 2018-12-09 | Disposition: A | Discharge status: Home / Self Care | Payer: BLUE CROSS/BLUE SHIELD | Source: Ambulatory Visit | Attending: Neurosurgery | Admitting: Neurosurgery

## 2018-12-09 ENCOUNTER — Encounter (HOSPITAL_COMMUNITY): Payer: Self-pay

## 2018-12-09 DIAGNOSIS — Z01812 Encounter for preprocedural laboratory examination: Secondary | ICD-10-CM | POA: Insufficient documentation

## 2018-12-09 HISTORY — DX: Other specified postprocedural states: Z98.890

## 2018-12-09 HISTORY — DX: Cardiac murmur, unspecified: R01.1

## 2018-12-09 HISTORY — DX: Family history of other specified conditions: Z84.89

## 2018-12-09 HISTORY — DX: Other specified postprocedural states: R11.2

## 2018-12-09 HISTORY — DX: Injury, unspecified, initial encounter: T14.90XA

## 2018-12-09 LAB — TYPE AND SCREEN
ABO/RH(D): O NEG
Antibody Screen: NEGATIVE

## 2018-12-09 LAB — CBC WITH DIFFERENTIAL/PLATELET
Abs Immature Granulocytes: 0.05 10*3/uL (ref 0.00–0.07)
Basophils Absolute: 0 10*3/uL (ref 0.0–0.1)
Basophils Relative: 0 %
Eosinophils Absolute: 0.1 10*3/uL (ref 0.0–0.5)
Eosinophils Relative: 1 %
HCT: 39.8 % (ref 36.0–46.0)
Hemoglobin: 12.3 g/dL (ref 12.0–15.0)
Immature Granulocytes: 0 %
Lymphocytes Relative: 17 %
Lymphs Abs: 2.2 10*3/uL (ref 0.7–4.0)
MCH: 23.5 pg — ABNORMAL LOW (ref 26.0–34.0)
MCHC: 30.9 g/dL (ref 30.0–36.0)
MCV: 76 fL — ABNORMAL LOW (ref 80.0–100.0)
Monocytes Absolute: 0.5 10*3/uL (ref 0.1–1.0)
Monocytes Relative: 4 %
Neutro Abs: 10 10*3/uL — ABNORMAL HIGH (ref 1.7–7.7)
Neutrophils Relative %: 78 %
Platelets: 414 10*3/uL — ABNORMAL HIGH (ref 150–400)
RBC: 5.24 MIL/uL — ABNORMAL HIGH (ref 3.87–5.11)
RDW: 15.1 % (ref 11.5–15.5)
WBC: 12.9 10*3/uL — ABNORMAL HIGH (ref 4.0–10.5)
nRBC: 0 % (ref 0.0–0.2)

## 2018-12-09 LAB — SURGICAL PCR SCREEN
MRSA, PCR: NEGATIVE
Staphylococcus aureus: POSITIVE — AB

## 2018-12-09 LAB — ABO/RH: ABO/RH(D): O NEG

## 2018-12-09 NOTE — Progress Notes (Signed)
PCP - Dr. Koleen Distance Cardiologist - denies  Chest x-ray - N/A EKG - 03/10/18 Stress Test - denies ECHO - 2004 Cardiac Cath - denies  Sleep Study - denies  Blood Thinner Instructions: N/A Aspirin Instructions: N/A  Anesthesia review: No  Pt states she had a heart murmur as a kid from a small heart defect. She was released from cardiology at the age of 61? (pt couldn't remember exact age). No issues or follow ups since then. Echo report in Epic from 2004.   Patient denies shortness of breath, fever, cough and chest pain at PAT appointment   Patient verbalized understanding of instructions that were given to them at the PAT appointment. Patient was also instructed that they will need to review over the PAT instructions again at home before surgery.

## 2018-12-09 NOTE — Progress Notes (Signed)
Mupirocin called into pt's pharmacy of choice; [facility] on [address].

## 2018-12-15 DIAGNOSIS — R809 Proteinuria, unspecified: Secondary | ICD-10-CM | POA: Diagnosis not present

## 2018-12-15 DIAGNOSIS — B373 Candidiasis of vulva and vagina: Secondary | ICD-10-CM | POA: Diagnosis not present

## 2018-12-15 DIAGNOSIS — Z01419 Encounter for gynecological examination (general) (routine) without abnormal findings: Secondary | ICD-10-CM | POA: Diagnosis not present

## 2018-12-15 DIAGNOSIS — Z3040 Encounter for surveillance of contraceptives, unspecified: Secondary | ICD-10-CM | POA: Diagnosis not present

## 2018-12-15 DIAGNOSIS — Z6841 Body Mass Index (BMI) 40.0 and over, adult: Secondary | ICD-10-CM | POA: Diagnosis not present

## 2018-12-18 MED ORDER — VANCOMYCIN HCL 10 G IV SOLR
1500.0000 mg | INTRAVENOUS | Status: AC
  Administered 2018-12-19: 1500 mg via INTRAVENOUS
  Filled 2018-12-18: qty 1500

## 2018-12-19 ENCOUNTER — Other Ambulatory Visit: Payer: Self-pay

## 2018-12-19 ENCOUNTER — Inpatient Hospital Stay (HOSPITAL_COMMUNITY): Payer: BLUE CROSS/BLUE SHIELD | Admitting: Certified Registered Nurse Anesthetist

## 2018-12-19 ENCOUNTER — Inpatient Hospital Stay (HOSPITAL_COMMUNITY): Payer: BLUE CROSS/BLUE SHIELD

## 2018-12-19 ENCOUNTER — Inpatient Hospital Stay (HOSPITAL_COMMUNITY)
Admission: AD | Admit: 2018-12-19 | Discharge: 2018-12-20 | DRG: 454 | Disposition: A | Discharge status: Home / Self Care | Payer: BLUE CROSS/BLUE SHIELD | Attending: Neurosurgery | Admitting: Neurosurgery

## 2018-12-19 ENCOUNTER — Encounter (HOSPITAL_COMMUNITY): Payer: Self-pay

## 2018-12-19 ENCOUNTER — Encounter (HOSPITAL_COMMUNITY)
Admission: AD | Disposition: A | Discharge status: Home / Self Care | Payer: Self-pay | Source: Home / Self Care | Attending: Neurosurgery

## 2018-12-19 DIAGNOSIS — M4317 Spondylolisthesis, lumbosacral region: Principal | ICD-10-CM | POA: Diagnosis present

## 2018-12-19 DIAGNOSIS — Z791 Long term (current) use of non-steroidal anti-inflammatories (NSAID): Secondary | ICD-10-CM

## 2018-12-19 DIAGNOSIS — F329 Major depressive disorder, single episode, unspecified: Secondary | ICD-10-CM | POA: Diagnosis not present

## 2018-12-19 DIAGNOSIS — Z885 Allergy status to narcotic agent status: Secondary | ICD-10-CM

## 2018-12-19 DIAGNOSIS — F419 Anxiety disorder, unspecified: Secondary | ICD-10-CM | POA: Diagnosis not present

## 2018-12-19 DIAGNOSIS — Z886 Allergy status to analgesic agent status: Secondary | ICD-10-CM

## 2018-12-19 DIAGNOSIS — Z6841 Body Mass Index (BMI) 40.0 and over, adult: Secondary | ICD-10-CM | POA: Diagnosis not present

## 2018-12-19 DIAGNOSIS — Z79899 Other long term (current) drug therapy: Secondary | ICD-10-CM

## 2018-12-19 DIAGNOSIS — M4310 Spondylolisthesis, site unspecified: Secondary | ICD-10-CM

## 2018-12-19 DIAGNOSIS — Z888 Allergy status to other drugs, medicaments and biological substances status: Secondary | ICD-10-CM | POA: Diagnosis not present

## 2018-12-19 DIAGNOSIS — Z882 Allergy status to sulfonamides status: Secondary | ICD-10-CM | POA: Diagnosis not present

## 2018-12-19 DIAGNOSIS — M4807 Spinal stenosis, lumbosacral region: Secondary | ICD-10-CM | POA: Diagnosis present

## 2018-12-19 DIAGNOSIS — M4326 Fusion of spine, lumbar region: Secondary | ICD-10-CM | POA: Diagnosis not present

## 2018-12-19 DIAGNOSIS — Z419 Encounter for procedure for purposes other than remedying health state, unspecified: Secondary | ICD-10-CM

## 2018-12-19 LAB — POCT PREGNANCY, URINE: Preg Test, Ur: NEGATIVE

## 2018-12-19 SURGERY — POSTERIOR LUMBAR FUSION 1 LEVEL
Anesthesia: General | Site: Back

## 2018-12-19 MED ORDER — LIDOCAINE 2% (20 MG/ML) 5 ML SYRINGE
INTRAMUSCULAR | Status: AC
  Filled 2018-12-19: qty 5

## 2018-12-19 MED ORDER — VANCOMYCIN HCL 1000 MG IV SOLR
INTRAVENOUS | Status: AC
  Filled 2018-12-19: qty 1000

## 2018-12-19 MED ORDER — SUGAMMADEX SODIUM 500 MG/5ML IV SOLN
INTRAVENOUS | Status: AC
  Filled 2018-12-19: qty 5

## 2018-12-19 MED ORDER — LIDOCAINE 2% (20 MG/ML) 5 ML SYRINGE
INTRAMUSCULAR | Status: DC | PRN
  Administered 2018-12-19: 80 mg via INTRAVENOUS

## 2018-12-19 MED ORDER — ROCURONIUM BROMIDE 50 MG/5ML IV SOSY
PREFILLED_SYRINGE | INTRAVENOUS | Status: AC
  Filled 2018-12-19: qty 5

## 2018-12-19 MED ORDER — THROMBIN 20000 UNITS EX SOLR
CUTANEOUS | Status: DC | PRN
  Administered 2018-12-19: 20 mL

## 2018-12-19 MED ORDER — PROPOFOL 10 MG/ML IV BOLUS
INTRAVENOUS | Status: AC
  Filled 2018-12-19: qty 20

## 2018-12-19 MED ORDER — SUGAMMADEX SODIUM 200 MG/2ML IV SOLN
INTRAVENOUS | Status: DC | PRN
  Administered 2018-12-19: 400 mg via INTRAVENOUS

## 2018-12-19 MED ORDER — SCOPOLAMINE 1 MG/3DAYS TD PT72
MEDICATED_PATCH | TRANSDERMAL | Status: DC | PRN
  Administered 2018-12-19: 1 via TRANSDERMAL

## 2018-12-19 MED ORDER — HYDROCODONE-ACETAMINOPHEN 7.5-325 MG PO TABS
1.0000 | ORAL_TABLET | Freq: Once | ORAL | Status: DC | PRN
  Administered 2018-12-19: 1 via ORAL

## 2018-12-19 MED ORDER — PHENOL 1.4 % MT LIQD: 1.0000 | OROMUCOSAL | Status: DC | PRN

## 2018-12-19 MED ORDER — BISACODYL 10 MG RE SUPP: 10.0000 mg | Freq: Every day | RECTAL | Status: DC | PRN

## 2018-12-19 MED ORDER — PROMETHAZINE HCL 25 MG PO TABS
25.0000 mg | ORAL_TABLET | Freq: Four times a day (QID) | ORAL | Status: DC | PRN
  Administered 2018-12-19: 25 mg via ORAL
  Filled 2018-12-19: qty 1

## 2018-12-19 MED ORDER — ACETAMINOPHEN 10 MG/ML IV SOLN
INTRAVENOUS | Status: AC
  Filled 2018-12-19: qty 100

## 2018-12-19 MED ORDER — CHLORHEXIDINE GLUCONATE CLOTH 2 % EX PADS: 6.0000 | MEDICATED_PAD | Freq: Once | CUTANEOUS | Status: DC

## 2018-12-19 MED ORDER — DIAZEPAM 5 MG PO TABS
5.0000 mg | ORAL_TABLET | Freq: Four times a day (QID) | ORAL | Status: DC | PRN
  Administered 2018-12-19: 5 mg via ORAL
  Administered 2018-12-20 (×3): 10 mg via ORAL
  Filled 2018-12-19: qty 1
  Filled 2018-12-19 (×3): qty 2

## 2018-12-19 MED ORDER — PROPOFOL 10 MG/ML IV BOLUS
INTRAVENOUS | Status: DC | PRN
  Administered 2018-12-19: 200 mg via INTRAVENOUS

## 2018-12-19 MED ORDER — FENTANYL CITRATE (PF) 250 MCG/5ML IJ SOLN
INTRAMUSCULAR | Status: DC | PRN
  Administered 2018-12-19: 50 ug via INTRAVENOUS
  Administered 2018-12-19 (×2): 100 ug via INTRAVENOUS

## 2018-12-19 MED ORDER — POLYETHYLENE GLYCOL 3350 17 G PO PACK: 17.0000 g | PACK | Freq: Every day | ORAL | Status: DC | PRN

## 2018-12-19 MED ORDER — VANCOMYCIN HCL 1 G IV SOLR
INTRAVENOUS | Status: DC | PRN
  Administered 2018-12-19: 1000 mg via TOPICAL

## 2018-12-19 MED ORDER — ROCURONIUM BROMIDE 10 MG/ML (PF) SYRINGE
PREFILLED_SYRINGE | INTRAVENOUS | Status: DC | PRN
  Administered 2018-12-19: 20 mg via INTRAVENOUS
  Administered 2018-12-19: 50 mg via INTRAVENOUS
  Administered 2018-12-19: 20 mg via INTRAVENOUS

## 2018-12-19 MED ORDER — OXYCODONE HCL 5 MG PO TABS
10.0000 mg | ORAL_TABLET | ORAL | Status: DC | PRN
  Administered 2018-12-19 – 2018-12-20 (×4): 10 mg via ORAL
  Filled 2018-12-19 (×4): qty 2

## 2018-12-19 MED ORDER — ROCURONIUM BROMIDE 50 MG/5ML IV SOSY
PREFILLED_SYRINGE | INTRAVENOUS | Status: AC
  Filled 2018-12-19: qty 10

## 2018-12-19 MED ORDER — SODIUM CHLORIDE 0.9 % IV SOLN: 250.0000 mL | INTRAVENOUS | Status: DC

## 2018-12-19 MED ORDER — SODIUM CHLORIDE 0.9 % IV SOLN
INTRAVENOUS | Status: DC | PRN
  Administered 2018-12-19: 500 mL

## 2018-12-19 MED ORDER — DEXAMETHASONE SODIUM PHOSPHATE 10 MG/ML IJ SOLN
10.0000 mg | INTRAMUSCULAR | Status: DC
  Filled 2018-12-19: qty 1

## 2018-12-19 MED ORDER — ONDANSETRON HCL 4 MG/2ML IJ SOLN
INTRAMUSCULAR | Status: AC
  Filled 2018-12-19: qty 2

## 2018-12-19 MED ORDER — THROMBIN 20000 UNITS EX SOLR
CUTANEOUS | Status: AC
  Filled 2018-12-19: qty 20000

## 2018-12-19 MED ORDER — SERTRALINE HCL 100 MG PO TABS
200.0000 mg | ORAL_TABLET | Freq: Every day | ORAL | Status: DC
  Administered 2018-12-19: 200 mg via ORAL
  Filled 2018-12-19: qty 2

## 2018-12-19 MED ORDER — FENTANYL CITRATE (PF) 100 MCG/2ML IJ SOLN
25.0000 ug | INTRAMUSCULAR | Status: DC | PRN
  Administered 2018-12-19 (×2): 50 ug via INTRAVENOUS

## 2018-12-19 MED ORDER — MIDAZOLAM HCL 2 MG/2ML IJ SOLN
INTRAMUSCULAR | Status: DC | PRN
  Administered 2018-12-19: 2 mg via INTRAVENOUS

## 2018-12-19 MED ORDER — HYDROMORPHONE HCL 1 MG/ML IJ SOLN
INTRAMUSCULAR | Status: AC
  Filled 2018-12-19: qty 0.5

## 2018-12-19 MED ORDER — ACETAMINOPHEN 650 MG RE SUPP: 650.0000 mg | RECTAL | Status: DC | PRN

## 2018-12-19 MED ORDER — FENTANYL CITRATE (PF) 100 MCG/2ML IJ SOLN
INTRAMUSCULAR | Status: AC
  Filled 2018-12-19: qty 2

## 2018-12-19 MED ORDER — ACETAMINOPHEN 325 MG PO TABS: 650.0000 mg | ORAL_TABLET | ORAL | Status: DC | PRN

## 2018-12-19 MED ORDER — ONDANSETRON HCL 4 MG PO TABS: 4.0000 mg | ORAL_TABLET | Freq: Four times a day (QID) | ORAL | Status: DC | PRN

## 2018-12-19 MED ORDER — HYDROCODONE-ACETAMINOPHEN 10-325 MG PO TABS: 1.0000 | ORAL_TABLET | ORAL | Status: DC | PRN

## 2018-12-19 MED ORDER — PROPOFOL 500 MG/50ML IV EMUL
INTRAVENOUS | Status: DC | PRN
  Administered 2018-12-19: 25 ug/kg/min via INTRAVENOUS

## 2018-12-19 MED ORDER — ACETAMINOPHEN 10 MG/ML IV SOLN
1000.0000 mg | Freq: Once | INTRAVENOUS | Status: DC | PRN
  Administered 2018-12-19: 1000 mg via INTRAVENOUS

## 2018-12-19 MED ORDER — HYDROMORPHONE HCL 1 MG/ML IJ SOLN
INTRAMUSCULAR | Status: DC | PRN
  Administered 2018-12-19: 0.5 mg via INTRAVENOUS

## 2018-12-19 MED ORDER — MIDAZOLAM HCL 2 MG/2ML IJ SOLN
INTRAMUSCULAR | Status: AC
  Filled 2018-12-19: qty 2

## 2018-12-19 MED ORDER — MENTHOL 3 MG MT LOZG: 1.0000 | LOZENGE | OROMUCOSAL | Status: DC | PRN

## 2018-12-19 MED ORDER — DEXAMETHASONE SODIUM PHOSPHATE 10 MG/ML IJ SOLN
INTRAMUSCULAR | Status: DC | PRN
  Administered 2018-12-19: 10 mg via INTRAVENOUS

## 2018-12-19 MED ORDER — DEXAMETHASONE SODIUM PHOSPHATE 10 MG/ML IJ SOLN
INTRAMUSCULAR | Status: AC
  Filled 2018-12-19: qty 1

## 2018-12-19 MED ORDER — VANCOMYCIN HCL 10 G IV SOLR
1250.0000 mg | Freq: Once | INTRAVENOUS | Status: AC
  Administered 2018-12-19: 1250 mg via INTRAVENOUS
  Filled 2018-12-19 (×2): qty 1250

## 2018-12-19 MED ORDER — LACTATED RINGERS IV SOLN
INTRAVENOUS | Status: DC
  Administered 2018-12-19 (×3): via INTRAVENOUS

## 2018-12-19 MED ORDER — TRAMADOL HCL 50 MG PO TABS
50.0000 mg | ORAL_TABLET | Freq: Four times a day (QID) | ORAL | Status: DC
  Administered 2018-12-20: 50 mg via ORAL
  Filled 2018-12-19: qty 1

## 2018-12-19 MED ORDER — ALPRAZOLAM 0.5 MG PO TABS
0.5000 mg | ORAL_TABLET | Freq: Every evening | ORAL | Status: DC | PRN
  Administered 2018-12-19: 0.5 mg via ORAL
  Filled 2018-12-19: qty 1

## 2018-12-19 MED ORDER — NORGESTIM-ETH ESTRAD TRIPHASIC 0.18/0.215/0.25 MG-35 MCG PO TABS: 1.0000 | ORAL_TABLET | Freq: Every day | ORAL | Status: DC

## 2018-12-19 MED ORDER — BUPIVACAINE HCL (PF) 0.25 % IJ SOLN
INTRAMUSCULAR | Status: DC | PRN
  Administered 2018-12-19: 20 mL

## 2018-12-19 MED ORDER — PROPOFOL 1000 MG/100ML IV EMUL
INTRAVENOUS | Status: AC
  Filled 2018-12-19: qty 100

## 2018-12-19 MED ORDER — 0.9 % SODIUM CHLORIDE (POUR BTL) OPTIME
TOPICAL | Status: DC | PRN
  Administered 2018-12-19: 1000 mL

## 2018-12-19 MED ORDER — HYDROCODONE-ACETAMINOPHEN 7.5-325 MG PO TABS
ORAL_TABLET | ORAL | Status: AC
  Filled 2018-12-19: qty 1

## 2018-12-19 MED ORDER — LAMOTRIGINE 100 MG PO TABS
200.0000 mg | ORAL_TABLET | Freq: Every day | ORAL | Status: DC
  Administered 2018-12-19: 200 mg via ORAL
  Filled 2018-12-19: qty 2

## 2018-12-19 MED ORDER — ONDANSETRON HCL 4 MG/2ML IJ SOLN
INTRAMUSCULAR | Status: DC | PRN
  Administered 2018-12-19: 4 mg via INTRAVENOUS

## 2018-12-19 MED ORDER — BUPIVACAINE HCL (PF) 0.25 % IJ SOLN
INTRAMUSCULAR | Status: AC
  Filled 2018-12-19: qty 30

## 2018-12-19 MED ORDER — DIPHENHYDRAMINE HCL 25 MG PO CAPS: 25.0000 mg | ORAL_CAPSULE | Freq: Four times a day (QID) | ORAL | Status: DC | PRN

## 2018-12-19 MED ORDER — FLEET ENEMA 7-19 GM/118ML RE ENEM: 1.0000 | ENEMA | Freq: Once | RECTAL | Status: DC | PRN

## 2018-12-19 MED ORDER — HYDROMORPHONE HCL 1 MG/ML IJ SOLN
1.0000 mg | INTRAMUSCULAR | Status: DC | PRN
  Administered 2018-12-19 – 2018-12-20 (×4): 1 mg via INTRAVENOUS
  Filled 2018-12-19 (×4): qty 1

## 2018-12-19 MED ORDER — SODIUM CHLORIDE 0.9% FLUSH: 3.0000 mL | Freq: Two times a day (BID) | INTRAVENOUS | Status: DC

## 2018-12-19 MED ORDER — SCOPOLAMINE 1 MG/3DAYS TD PT72
MEDICATED_PATCH | TRANSDERMAL | Status: AC
  Filled 2018-12-19: qty 1

## 2018-12-19 MED ORDER — ONDANSETRON HCL 4 MG/2ML IJ SOLN: 4.0000 mg | Freq: Four times a day (QID) | INTRAMUSCULAR | Status: DC | PRN

## 2018-12-19 MED ORDER — FENTANYL CITRATE (PF) 250 MCG/5ML IJ SOLN
INTRAMUSCULAR | Status: AC
  Filled 2018-12-19: qty 5

## 2018-12-19 MED ORDER — SODIUM CHLORIDE 0.9% FLUSH: 3.0000 mL | INTRAVENOUS | Status: DC | PRN

## 2018-12-19 SURGICAL SUPPLY — 69 items
ADH SKN CLS APL DERMABOND .7 (GAUZE/BANDAGES/DRESSINGS) ×1
APL SKNCLS STERI-STRIP NONHPOA (GAUZE/BANDAGES/DRESSINGS) ×1
BAG DECANTER FOR FLEXI CONT (MISCELLANEOUS) ×2 IMPLANT
BENZOIN TINCTURE PRP APPL 2/3 (GAUZE/BANDAGES/DRESSINGS) ×2 IMPLANT
BLADE CLIPPER SURG (BLADE) IMPLANT
BUR CUTTER 7.0 ROUND (BURR) IMPLANT
BUR MATCHSTICK NEURO 3.0 LAGG (BURR) ×2 IMPLANT
CANISTER SUCT 3000ML PPV (MISCELLANEOUS) ×2 IMPLANT
CAP LCK SPNE (Orthopedic Implant) ×4 IMPLANT
CAP LOCK SPINE RADIUS (Orthopedic Implant) IMPLANT
CAP LOCKING (Orthopedic Implant) ×8 IMPLANT
CARTRIDGE OIL MAESTRO DRILL (MISCELLANEOUS) ×1 IMPLANT
CLSR STERI-STRIP ANTIMIC 1/2X4 (GAUZE/BANDAGES/DRESSINGS) ×1 IMPLANT
CONT SPEC 4OZ CLIKSEAL STRL BL (MISCELLANEOUS) ×2 IMPLANT
COVER BACK TABLE 60X90IN (DRAPES) ×2 IMPLANT
COVER WAND RF STERILE (DRAPES) ×2 IMPLANT
DECANTER SPIKE VIAL GLASS SM (MISCELLANEOUS) ×2 IMPLANT
DERMABOND ADVANCED (GAUZE/BANDAGES/DRESSINGS) ×1
DERMABOND ADVANCED .7 DNX12 (GAUZE/BANDAGES/DRESSINGS) ×1 IMPLANT
DEVICE INTERBODY ELEVATE 23X10 (Cage) ×2 IMPLANT
DIFFUSER DRILL AIR PNEUMATIC (MISCELLANEOUS) ×2 IMPLANT
DRAPE C-ARM 42X72 X-RAY (DRAPES) ×4 IMPLANT
DRAPE HALF SHEET 40X57 (DRAPES) ×1 IMPLANT
DRAPE LAPAROTOMY 100X72X124 (DRAPES) ×2 IMPLANT
DRAPE SURG 17X23 STRL (DRAPES) ×8 IMPLANT
DRSG OPSITE POSTOP 4X6 (GAUZE/BANDAGES/DRESSINGS) ×2 IMPLANT
DURAPREP 26ML APPLICATOR (WOUND CARE) ×2 IMPLANT
ELECT REM PT RETURN 9FT ADLT (ELECTROSURGICAL) ×2
ELECTRODE REM PT RTRN 9FT ADLT (ELECTROSURGICAL) ×1 IMPLANT
EVACUATOR 1/8 PVC DRAIN (DRAIN) IMPLANT
GAUZE 4X4 16PLY RFD (DISPOSABLE) ×2 IMPLANT
GAUZE SPONGE 4X4 12PLY STRL (GAUZE/BANDAGES/DRESSINGS) IMPLANT
GLOVE BIO SURGEON STRL SZ 6.5 (GLOVE) ×1 IMPLANT
GLOVE BIOGEL PI IND STRL 6.5 (GLOVE) IMPLANT
GLOVE BIOGEL PI IND STRL 7.0 (GLOVE) IMPLANT
GLOVE BIOGEL PI IND STRL 7.5 (GLOVE) IMPLANT
GLOVE BIOGEL PI INDICATOR 6.5 (GLOVE) ×2
GLOVE BIOGEL PI INDICATOR 7.0 (GLOVE) ×1
GLOVE BIOGEL PI INDICATOR 7.5 (GLOVE) ×2
GLOVE ECLIPSE 9.0 STRL (GLOVE) ×4 IMPLANT
GLOVE EXAM NITRILE XL STR (GLOVE) IMPLANT
GLOVE SURG SS PI 7.0 STRL IVOR (GLOVE) ×4 IMPLANT
GOWN STRL REUS W/ TWL LRG LVL3 (GOWN DISPOSABLE) IMPLANT
GOWN STRL REUS W/ TWL XL LVL3 (GOWN DISPOSABLE) ×2 IMPLANT
GOWN STRL REUS W/TWL 2XL LVL3 (GOWN DISPOSABLE) IMPLANT
GOWN STRL REUS W/TWL LRG LVL3 (GOWN DISPOSABLE) ×6
GOWN STRL REUS W/TWL XL LVL3 (GOWN DISPOSABLE) ×4
KIT BASIN OR (CUSTOM PROCEDURE TRAY) ×2 IMPLANT
KIT TURNOVER KIT B (KITS) ×2 IMPLANT
MILL MEDIUM DISP (BLADE) ×2 IMPLANT
NEEDLE HYPO 22GX1.5 SAFETY (NEEDLE) ×2 IMPLANT
NS IRRIG 1000ML POUR BTL (IV SOLUTION) ×2 IMPLANT
OIL CARTRIDGE MAESTRO DRILL (MISCELLANEOUS) ×2
PACK LAMINECTOMY NEURO (CUSTOM PROCEDURE TRAY) ×2 IMPLANT
ROD RADIUS 40MM (Neuro Prosthesis/Implant) ×4 IMPLANT
ROD SPNL 40X5.5XNS TI RDS (Neuro Prosthesis/Implant) IMPLANT
SCREW 5.75X45MM (Screw) ×2 IMPLANT
SCREW 6.75X35MM (Screw) ×2 IMPLANT
SPONGE LAP 4X18 RFD (DISPOSABLE) ×1 IMPLANT
SPONGE SURGIFOAM ABS GEL 100 (HEMOSTASIS) ×2 IMPLANT
STRIP CLOSURE SKIN 1/2X4 (GAUZE/BANDAGES/DRESSINGS) ×4 IMPLANT
SUT VIC AB 0 CT1 18XCR BRD8 (SUTURE) ×2 IMPLANT
SUT VIC AB 0 CT1 8-18 (SUTURE) ×4
SUT VIC AB 2-0 CT1 18 (SUTURE) ×2 IMPLANT
SUT VIC AB 3-0 SH 8-18 (SUTURE) ×4 IMPLANT
TOWEL GREEN STERILE (TOWEL DISPOSABLE) ×2 IMPLANT
TOWEL GREEN STERILE FF (TOWEL DISPOSABLE) ×2 IMPLANT
TRAY FOLEY MTR SLVR 16FR STAT (SET/KITS/TRAYS/PACK) ×2 IMPLANT
WATER STERILE IRR 1000ML POUR (IV SOLUTION) ×2 IMPLANT

## 2018-12-19 NOTE — Anesthesia Preprocedure Evaluation (Signed)
Anesthesia Evaluation  Patient identified by MRN, date of birth, ID band Patient awake    Reviewed: Allergy & Precautions, NPO status , Patient's Chart, lab work & pertinent test results  History of Anesthesia Complications (+) PONV and history of anesthetic complications  Airway Mallampati: II  TM Distance: >3 FB Neck ROM: Full    Dental  (+) Teeth Intact,    Pulmonary neg pulmonary ROS,    breath sounds clear to auscultation       Cardiovascular negative cardio ROS   Rhythm:Regular     Neuro/Psych PSYCHIATRIC DISORDERS Anxiety Depression    GI/Hepatic negative GI ROS, Neg liver ROS,   Endo/Other  Morbid obesity  Renal/GU negative Renal ROS     Musculoskeletal   Abdominal   Peds  Hematology negative hematology ROS (+)   Anesthesia Other Findings   Reproductive/Obstetrics                             Anesthesia Physical Anesthesia Plan  ASA: II  Anesthesia Plan: General   Post-op Pain Management:    Induction: Intravenous  PONV Risk Score and Plan: 4 or greater and Ondansetron, Dexamethasone, Propofol infusion, Midazolam and Scopolamine patch - Pre-op  Airway Management Planned: Oral ETT  Additional Equipment: None  Intra-op Plan:   Post-operative Plan: Extubation in OR  Informed Consent: I have reviewed the patients History and Physical, chart, labs and discussed the procedure including the risks, benefits and alternatives for the proposed anesthesia with the patient or authorized representative who has indicated his/her understanding and acceptance.     Dental advisory given  Plan Discussed with: CRNA and Surgeon  Anesthesia Plan Comments:         Anesthesia Quick Evaluation

## 2018-12-19 NOTE — Transfer of Care (Signed)
Immediate Anesthesia Transfer of Care Note  Patient: Dawn King  Procedure(s) Performed: POSTERIOR LUMBAR INTERBODY FUSION - LUMBAR FIVE-SACRAL ONE (N/A Back)  Patient Location: PACU  Anesthesia Type:General  Level of Consciousness: awake, alert  and oriented  Airway & Oxygen Therapy: Patient Spontanous Breathing and Patient connected to nasal cannula oxygen  Post-op Assessment: Report given to RN and Post -op Vital signs reviewed and stable  Post vital signs: Reviewed and stable  Last Vitals:  Vitals Value Taken Time  BP 118/60 12/19/2018  3:57 PM  Temp 36.5 C 12/19/2018  3:57 PM  Pulse 80 12/19/2018  3:59 PM  Resp 16 12/19/2018  3:59 PM  SpO2 96 % 12/19/2018  3:59 PM  Vitals shown include unvalidated device data.  Last Pain:  Vitals:   12/19/18 1557  TempSrc:   PainSc: Asleep      Patients Stated Pain Goal: 3 (12/19/18 1123)  Complications: No apparent anesthesia complications

## 2018-12-19 NOTE — Op Note (Signed)
Date of procedure: 12/19/2018  Date of dictation: Same  Service: Neurosurgery  Preoperative diagnosis: Grade 2 L5-S1 lytic mobile spondylolisthesis  Postoperative diagnosis: Same  Procedure Name: L5 Gill procedure with bilateral L5 and S1 decompressive foraminotomies, more than would be required for simple interbody fusion alone.  L5-S1 posterior lumbar interbody fusion utilizing interbody cages and locally harvested autograft  L5-S1 posterior lateral arthrodesis utilizing nonsegmental pedicle screw fixation and local autograft  Surgeon:Lipa Knauff A.Dany Walther, M.D.  Asst. Surgeon: Doran DurandBergman, NP  Anesthesia: General  Indication: 28 year old female with intractable back pain with radicular symptoms failing conservative management.  Work-up demonstrates evidence of a mobile grade 2 lytic spondylolisthesis.  Patient presents now for decompression and fusion in hopes of improving her symptoms.  Operative note: After induction anesthesia was performed.  Patient was positioned prone on the Wilson frame and properly padded.  Lumbar region prepped and draped sterilely.  Incision made overlying L5-S1.  Dissection performed bilaterally.  Retractor placed.  Fluoroscopy used.  Levels confirmed.  Gill procedure was then performed by removing the entire lamina of L5 bilaterally as well as the inferior facets of L5 bilaterally.  Ligament flavum elevated and resected.  Foraminotomies completed on the course the exiting L5 nerve root and S1 nerve roots by resecting further facet from the pedicle of L5 and the superior facet of S1 thus completing the Gill type procedure decompression.  The ligament flavum was elevated and resected.  Epidural venous plexus coagulated and cut.  Bilateral discectomies then performed.  The space then distracted.  The space then cleaned of all soft tissue.  With a distractor placed the patient's right side a 10 mm extra lordotic [employer] expandable cage packed with locally harvested autograft  was impacted into place and expanded to its full extent.  Distractor removed patient's right side.  The space prepared for interbody fusion by cleaning soft tissue.  Morselized autograft packed in the interspace.  Second cage packed with autograft was then impacted in the place and expanded to its full extent.  Pedicles of L5 and S1 were identified using surface landmarks and intraoperative fluoroscopy.  Superficial bone overlying the pedicle was removed using high-speed drill.  Pedicle was then probed using pedicle all each pedicle tract was then probed and found to be solidly within the bone.  Each pedicle tract was then tapped with a screw tapped and 5.75 mm radius bran screws from [employer] were placed bilaterally at L5 and 6.75 mm screws were placed bilaterally at S1.  Final images reveal good position of the cages and the hardware at the proper upper level with normal alignment of spine.  Wound is then irrigated one final time.  Morselized autograft was packed posterior laterally after decorticating the transverse processes and sacral ala.  Short segment titanium rod placed over the screw heads at L5 and S1.  Locking caps placed of the screws.  Locking caps then engaged with a construct under compression.  Gelfoam was placed over the laminectomy defect.  Vancomycin powder was placed the deep wound space.  Wounds and closed in layers with Vicryl sutures.  Steri-Strips and sterile dressing were applied.  No apparent complications.  Patient tolerated the procedure well and she returns to the recovery room postop.

## 2018-12-19 NOTE — H&P (Signed)
Dawn King is an 28 y.o. female.   Chief Complaint: Back pain HPI: 28 year old female with severe back pain radiating into her lower extremities.  Symptoms have progressively worsened over years.  Work-up demonstrates evidence of an unstable grade 2 L5-S1 lytic spondylolisthesis with severe foraminal stenosis.  Patient is failed conservative management presents now for lumbar decompression and fusion.  Past Medical History:  Diagnosis Date  . Anxiety   . Depression   . Family history of adverse reaction to anesthesia    maternal aunt had difficulty waken up.   . H/O multiple concussions   . Heart murmur    as a child; had a heart defect.   . Pneumonia   . PONV (postoperative nausea and vomiting)   . Trauma 09/2010   cracked liver from being "stomped on by a horse".    Past Surgical History:  Procedure Laterality Date  . ADENOIDECTOMY    . BREAST SURGERY Right 2002   crushed artery from "being stomped on by a horse"...had to have a drain put in to drain blood.   . TONSILLECTOMY      History reviewed. No pertinent family history. Social History:  reports that she has never smoked. She has never used smokeless tobacco. She reports current alcohol use. She reports current drug use. Drug: Marijuana.  Allergies:  Allergies  Allergen Reactions  . Levaquin [Levofloxacin] Anaphylaxis and Rash  . Cefaclor Hives and Rash    severe rash  . Diphenhydramine Hives  . Hydroxyzine Hives  . Percocet [Oxycodone-Acetaminophen] Hives  . Sulfonamide Derivatives Hives and Rash    severe rash  . Tramadol Nausea And Vomiting  . Other Itching    *Cantoloupe* makes mouth really itchy   . Sunflower Oil Itching    Mouth really itchy   . Clindamycin/Lincomycin Rash  . Codeine Rash  . Keflex [Cephalexin] Rash  . Morphine And Related Rash  . Zofran [Ondansetron] Rash    Medications Prior to Admission  Medication Sig Dispense Refill  . ALPRAZolam (XANAX) 0.5 MG tablet Take 0.5 mg by  mouth at bedtime as needed for sleep.    Marland Kitchen ibuprofen (ADVIL,MOTRIN) 200 MG tablet Take 400 mg by mouth every 6 (six) hours as needed for fever, headache, mild pain, moderate pain or cramping. For pain     . lamoTRIgine (LAMICTAL) 100 MG tablet Take 200 mg by mouth at bedtime.     . sertraline (ZOLOFT) 100 MG tablet Take 200 mg by mouth at bedtime.     . traMADol (ULTRAM) 50 MG tablet Take 50 mg by mouth every 6 (six) hours.    Nuala Alpha 0.18/0.215/0.25 MG-35 MCG tablet Take 1 tablet by mouth daily.  0  . cyclobenzaprine (FLEXERIL) 5 MG tablet Take 1 tablet (5 mg total) by mouth at bedtime. (Patient not taking: Reported on 11/07/2018) 15 tablet 0  . dicyclomine (BENTYL) 20 MG tablet Take 1 tablet (20 mg total) by mouth 3 (three) times daily before meals. (Patient not taking: Reported on 11/07/2018) 30 tablet 0  . meloxicam (MOBIC) 15 MG tablet Take 1 tablet (15 mg total) by mouth daily. (Patient not taking: Reported on 11/07/2018) 30 tablet 0  . omeprazole (PRILOSEC) 20 MG capsule Take 1 capsule (20 mg total) by mouth daily. (Patient not taking: Reported on 11/07/2018) 30 capsule 0    Results for orders placed or performed during the hospital encounter of 12/19/18 (from the past 48 hour(s))  Pregnancy, urine POC     Status: None  Collection Time: 12/19/18 11:18 AM  Result Value Ref Range   Preg Test, Ur NEGATIVE NEGATIVE    Comment:        THE SENSITIVITY OF THIS METHODOLOGY IS >24 mIU/mL    No results found.  Pertinent items noted in HPI and remainder of comprehensive ROS otherwise negative.  Blood pressure (!) 142/85, pulse 98, temperature 98.5 F (36.9 C), temperature source Oral, height 5\' 5"  (1.651 m), weight 115.7 kg, SpO2 97 %.  Patient is awake and alert.  She is oriented and appropriate.  Speech is fluent.  Judgment insight are intact.  Cranial nerve function normal bilaterally.  Motor examination with intact motor strength bilateral.  Sensory examination some mild decrease  sensation pinprick and light touch in her right L5 dermatome.  Straight raising positive on the right equivocal on the left.  Reflexes normal active except her Achilles reflexes are diminished bilaterally.  No evidence of long track signs.  Gait is antalgic.  Posture is mildly flexed.  Examination head ears eyes nose throat is unremarked.  Chest and abdomen are benign.  Extremities are free from injury or deformity. Assessment/Plan Grade 2 L5-S1 mobile lytic spondylolisthesis.  Plan L5 Gill procedure with L5-S1 posterior lumbar interbody fusion utilizing interbody cages, locally harvested autograft, and augmented with posterior lateral arthrodesis utilizing nonsegmental pedicle screw fixation and local autografting.  Risks and benefits of been explained.  Patient wishes to proceed.  Sherilyn Cooter A Jaselyn Nahm 12/19/2018, 12:20 PM

## 2018-12-19 NOTE — Brief Op Note (Signed)
12/19/2018  3:39 PM  PATIENT:  Leana Roe  28 y.o. female  PRE-OPERATIVE DIAGNOSIS:  Spondylolisthesis  POST-OPERATIVE DIAGNOSIS:  Spondylolisthesis  PROCEDURE:  Procedure(s): POSTERIOR LUMBAR INTERBODY FUSION - LUMBAR FIVE-SACRAL ONE (N/A)  SURGEON:  Surgeon(s) and Role:    * Julio Sicks, MD - Primary  PHYSICIAN ASSISTANT:   ASSISTANTSTawana Scale   ANESTHESIA:   general  EBL:  200 mL   BLOOD ADMINISTERED:none  DRAINS: none   LOCAL MEDICATIONS USED:  MARCAINE     SPECIMEN:  No Specimen  DISPOSITION OF SPECIMEN:  N/A  COUNTS:  YES  TOURNIQUET:  * No tourniquets in log *  DICTATION: .Dragon Dictation  PLAN OF CARE: Admit to inpatient   PATIENT DISPOSITION:  PACU - hemodynamically stable.   Delay start of Pharmacological VTE agent (>24hrs) due to surgical blood loss or risk of bleeding: yes

## 2018-12-19 NOTE — Anesthesia Procedure Notes (Signed)
Procedure Name: Intubation Date/Time: 12/19/2018 12:59 PM Performed by: Valda Favia, CRNA Pre-anesthesia Checklist: Patient identified, Emergency Drugs available, Suction available and Patient being monitored Patient Re-evaluated:Patient Re-evaluated prior to induction Oxygen Delivery Method: Circle System Utilized Preoxygenation: Pre-oxygenation with 100% oxygen Induction Type: IV induction Ventilation: Mask ventilation without difficulty Laryngoscope Size: Mac and 4 Grade View: Grade I Tube type: Oral Tube size: 7.0 mm Number of attempts: 1 Airway Equipment and Method: Stylet and Oral airway Placement Confirmation: ETT inserted through vocal cords under direct vision,  positive ETCO2 and breath sounds checked- equal and bilateral Secured at: 22 cm Tube secured with: Tape Dental Injury: Teeth and Oropharynx as per pre-operative assessment

## 2018-12-19 NOTE — Progress Notes (Signed)
Pharmacy Antibiotic Note  Dawn King is a 28 y.o. female admitted on 12/19/2018 for spinal surgery. Multiple medication allergies including cephalosporins.  Pharmacy has been consulted for Vancomycin x 1 dose post-op for surgical prophylaxis.  No drain.    Vancomycin 1500 mg IV given pre-op at 11:30am.   No pre-op chemistries, last creatinine 0.68 on 11/07/18.   Estimated creatinine clearance ~90-100 ml/min.  Plan:  Vancomycin 1250 mg IV x 1 dose at 11pm tonight.  No follow up needed.  Pharmacy signing off.  Height: 5\' 5"  (165.1 cm) Weight: 255 lb (115.7 kg) IBW/kg (Calculated) : 57  Temp (24hrs), Avg:98 F (36.7 C), Min:97.7 F (36.5 C), Max:98.5 F (36.9 C)   Allergies  Allergen Reactions  . Levaquin [Levofloxacin] Anaphylaxis and Rash  . Cefaclor Hives and Rash    severe rash  . Diphenhydramine Hives  . Hydroxyzine Hives  . Percocet [Oxycodone-Acetaminophen] Hives    12/19/18: patient reports that she tolerates Oxycodone (but not Percocet)  . Sulfonamide Derivatives Hives and Rash    severe rash  . Tramadol Nausea And Vomiting  . Other Itching    *Cantoloupe* makes mouth really itchy   . Sunflower Oil Itching    Mouth really itchy   . Clindamycin/Lincomycin Rash  . Codeine Rash  . Keflex [Cephalexin] Rash  . Morphine And Related Rash  . Zofran [Ondansetron] Rash   Thank you for allowing pharmacy to be a part of this patient's care.  Dennie Fetters, [place] Pager: [phone] 12/19/2018 6:13 PM

## 2018-12-20 MED ORDER — CYCLOBENZAPRINE HCL 10 MG PO TABS: 10.0000 mg | ORAL_TABLET | Freq: Three times a day (TID) | ORAL | 2 refills | Status: DC | PRN

## 2018-12-20 MED ORDER — OXYCODONE HCL 10 MG PO TABS: 10.0000 mg | ORAL_TABLET | ORAL | 0 refills | Status: DC | PRN

## 2018-12-20 MED ORDER — HYDROMORPHONE HCL 2 MG PO TABS
2.0000 mg | ORAL_TABLET | ORAL | Status: DC | PRN
  Administered 2018-12-20 (×2): 2 mg via ORAL
  Filled 2018-12-20 (×2): qty 1

## 2018-12-20 MED ORDER — DIAZEPAM 5 MG PO TABS: 5.0000 mg | ORAL_TABLET | Freq: Four times a day (QID) | ORAL | 0 refills | Status: DC | PRN

## 2018-12-20 NOTE — Evaluation (Signed)
Occupational Therapy Evaluation Patient Details Name: Dawn King MRN: [id number] DOB: 05-28-1991 Today's Date: 12/20/2018    History of Present Illness Patient is a 28 y/o female who presents s/p L5-S1 PLIF. PMH includes concussions, anxiety, depression.   Clinical Impression   Patient evaluated by Occupational Therapy with no further acute OT needs identified. All education has been completed and the patient has no further questions. Pt with 10/10 pain, increased anxiety, and decreased activity tolerance.  She is able to perform ADLs with min guard - min A using AE.  All education completed.  See below for any follow-up Occupational Therapy or equipment needs. OT is signing off. Thank you for this referral.      Follow Up Recommendations  No OT follow up;Supervision/Assistance - 24 hour    Equipment Recommendations  3 in 1 bedside commode    Recommendations for Other Services       Precautions / Restrictions Precautions Precautions: Back Precaution Booklet Issued: Yes (comment)(back handout ) Precaution Comments: Reviewed back precautions Required Braces or Orthoses: Spinal Brace Spinal Brace: Lumbar corset;Applied in sitting position      Mobility Bed Mobility Overal bed mobility: Needs Assistance Bed Mobility: Rolling;Sidelying to Sit;Sit to Sidelying Rolling: Min guard Sidelying to sit: Min guard     Sit to sidelying: Min guard General bed mobility comments: Pt initially unable to roll from Lt to Rt, to exited bed on the Rt.  She required increased time to perform bed mobility.    Transfers Overall transfer level: Needs assistance Equipment used: Rolling walker (2 wheeled) Transfers: Sit to/from [employer] CorporationStand;Stand Pivot Transfers Sit to Stand: Min guard Stand pivot transfers: Min assist       General transfer comment: pt initially with difficulty moving sit to stand from bed.  She reports her bed is higher at home, so bed level elevated.  Using RW, required min  guard assist to stand.  Able to move sit <> from comfort height toilet with min guard assist with grab bars     Balance Overall balance assessment: Needs assistance Sitting-balance support: Feet supported;Bilateral upper extremity supported Sitting balance-Leahy Scale: Fair     Standing balance support: During functional activity Standing balance-Leahy Scale: Poor Standing balance comment: requires UE support                            ADL either performed or assessed with clinical judgement   ADL Overall ADL's : Needs assistance/impaired Eating/Feeding: Independent   Grooming: Wash/dry hands;Wash/dry face;Oral care;Brushing hair;Standing;Minimal assistance Grooming Details (indicate cue type and reason): Pt deferred brushing teeth.  Instructed her on proper technique  Upper Body Bathing: Supervision/ safety;Sitting   Lower Body Bathing: Minimal assistance;Sit to/from stand;With adaptive equipment;Cueing for safety;Cueing for sequencing;Cueing for back precautions Lower Body Bathing Details (indicate cue type and reason): instructed on use of AE  Upper Body Dressing : Supervision/safety;Sitting;Set up   Lower Body Dressing: Minimal assistance;With adaptive equipment;Cueing for sequencing;Cueing for back precautions;Sit to/from stand Lower Body Dressing Details (indicate cue type and reason): Pt unable to perform figure 4. She was instructed in use and acquisition of AE  Toilet Transfer: Ambulation;Min guard;RW;Comfort height toilet;Grab bars Toilet Transfer Details (indicate cue type and reason): Pt requires close min guard assist due to increased pain, and anxiety.  Toileting- ArchitectClothing Manipulation and Hygiene: Min guard;Sit to/from stand Toileting - Clothing Manipulation Details (indicate cue type and reason): Pt instructed in use of toileting aid    Tub/Shower  Transfer Details (indicate cue type and reason): Instructed pt and father for pt to use walk in shower, and to  use 3in1 commode as a seat in shower  Functional mobility during ADLs: Min guard;Minimal assistance;Rolling walker General ADL Comments: Pt highly anxious, and crying with pain, however, when she engages in ADLs is able to perform them fairly well.  Unsteadiness noted intermittently     Vision         Perception     Praxis      Pertinent Vitals/Pain Pain Assessment: 0-10 Pain Score: 10-Worst pain ever Pain Location: back, BLEs Pain Descriptors / Indicators: Burning;Radiating;Crying Pain Intervention(s): Monitored during session;Limited activity within patient's tolerance;Repositioned;Premedicated before session;Utilized relaxation techniques     Hand Dominance Right   Extremity/Trunk Assessment Upper Extremity Assessment Upper Extremity Assessment: Overall WFL for tasks assessed   Lower Extremity Assessment Lower Extremity Assessment: Overall WFL for tasks assessed   Cervical / Trunk Assessment Cervical / Trunk Assessment: Other exceptions Cervical / Trunk Exceptions: s/p spine surgery   Communication Communication Communication: No difficulties   Cognition Arousal/Alertness: Awake/alert Behavior During Therapy: Anxious Overall Cognitive Status: Within Functional Limits for tasks assessed                                 General Comments: tearful throughout    General Comments  dad present.  Reviewed back precautions and appropriate activity.   Reviewed safety with ADLs and use and acquisition of AE     Exercises     Shoulder Instructions      Home Living Family/patient expects to be discharged to:: Private residence Living Arrangements: Parent Available Help at Discharge: Family;Available 24 hours/day Type of Home: House Home Access: Stairs to enter [employer] of Steps: 2 Entrance Stairs-Rails: Right Home Layout: One level     Bathroom Shower/Tub: Walk-in shower;Tub/shower unit   Bathroom Toilet: Standard     Home Equipment:  None          Prior Functioning/Environment Level of Independence: Independent        Comments: 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 Loves to read.         OT Problem List: Decreased activity tolerance;Impaired balance (sitting and/or standing);Decreased safety awareness;Decreased knowledge of use of DME or AE;Decreased knowledge of precautions;Obesity;Pain      OT Treatment/Interventions:      OT Goals(Current goals can be found in the care plan section) Acute Rehab OT Goals Patient Stated Goal: to have less pain  OT Goal Formulation: With patient  OT Frequency:     Barriers to D/C:            Co-evaluation              AM-PAC OT "6 Clicks" Daily Activity     Outcome Measure Help from another person eating meals?: None Help from another person taking care of personal grooming?: A Little Help from another person toileting, which includes using toliet, bedpan, or urinal?: A Little Help from another person bathing (including washing, rinsing, drying)?: A Little Help from another person to put on and taking off regular upper body clothing?: A Little Help from another person to put on and taking off regular lower body clothing?: A Little 6 Click Score: 19   End of Session Equipment Utilized During Treatment: Gait belt;Rolling walker;Back brace Nurse Communication: Mobility status;Patient requests pain meds  Activity Tolerance: Patient limited by pain Patient left:  in bed;with call bell/phone within reach;with family/visitor present  OT Visit Diagnosis: Pain Pain - part of body: (back )                Time: [id number] OT Time Calculation (min): 53 min Charges:  OT General Charges $OT Visit: 1 Visit OT Evaluation $OT Eval Moderate Complexity: 1 Mod OT Treatments $Self Care/Home Management : 38-52 mins  Jeani HawkingWendi Brinley Rosete, OTR/L Acute Rehabilitation Services Pager [phone] Office [phone]   Jeani HawkingConarpe, Acheron Sugg M 12/20/2018, 1:46 PM

## 2018-12-20 NOTE — Progress Notes (Signed)
Patient alert and oriented, Dawn King's well, voiding adequate amount of urine, swallowing without difficulty, c/o moderate pain and meds given prior to discharged for ride and discomfort. Patient discharged home with family. Scripts send to the pharmacy, and discharged instructions given to patient and father Patient and father stated understanding of instructions given. Patient has an appointment with Dr. Jordan Likes and patient also instructed to call Dr. Jordan Likes on Monday if pain is still not relieved after taking medication given.

## 2018-12-20 NOTE — Discharge Summary (Signed)
Physician Discharge Summary  Patient ID: Dawn King MRN: [id number] DOB/AGE: [birth year] 28 y.o.  Admit date: 12/19/2018 Discharge date: 12/20/2018  Admission Diagnoses:Grade 2 L5-S1 lytic mobile spondylolisthesis  Discharge Diagnoses: Grade 2 L5-S1 lytic mobile spondylolisthesis Active Problems:   Spondylolisthesis   Discharged Condition: good  Hospital Course: Patient underwent L5 Gill procedure with bilateral L5 and S1 decompressive foraminotomies, more than would be required for simple interbody fusion alone.  L5-S1 posterior lumbar interbody fusion utilizing interbody cages and locally harvested autograft  L5-S1 posterior lateral arthrodesis utilizing nonsegmental pedicle screw fixation and local autograft  She was sore, but mobilizing on POD 1.  Consults: None  Significant Diagnostic Studies: None  Treatments: surgery: L5 Gill procedure with bilateral L5 and S1 decompressive foraminotomies, more than would be required for simple interbody fusion alone.  L5-S1 posterior lumbar interbody fusion utilizing interbody cages and locally harvested autograft  L5-S1 posterior lateral arthrodesis utilizing nonsegmental pedicle screw fixation and local autograft  Discharge Exam: Blood pressure 119/69, pulse 82, temperature 97.9 F (36.6 C), temperature source Oral, resp. rate 20, height 5\' 5"  (1.651 m), weight 115.7 kg, SpO2 96 %. Neurologic: Alert and oriented X 3, normal strength and tone. Normal symmetric reflexes. Normal coordination and gait Wound:CDI  Disposition: Home   Discharge Instructions    Diet - low sodium heart healthy   Complete by:  As directed    Increase activity slowly   Complete by:  As directed      Allergies as of 12/20/2018      Reactions   Levaquin [levofloxacin] Anaphylaxis, Rash   Cefaclor Hives, Rash   severe rash   Diphenhydramine Hives   Hydroxyzine Hives   Percocet [oxycodone-acetaminophen] Hives   12/19/18: patient  reports that she tolerates Oxycodone (but not Percocet)   Sulfonamide Derivatives Hives, Rash   severe rash   Tramadol Nausea And Vomiting   Other Itching   *Cantoloupe* makes mouth really itchy    Sunflower Oil Itching   Mouth really itchy    Clindamycin/lincomycin Rash   Codeine Rash   Keflex [cephalexin] Rash   Morphine And Related Rash   Zofran [ondansetron] Rash      Medication List    STOP taking these medications   ibuprofen 200 MG tablet Commonly known as:  ADVIL,MOTRIN   meloxicam 15 MG tablet Commonly known as:  MOBIC     TAKE these medications   ALPRAZolam 0.5 MG tablet Commonly known as:  XANAX Take 0.5 mg by mouth at bedtime as needed for sleep.   cyclobenzaprine 5 MG tablet Commonly known as:  FLEXERIL Take 1 tablet (5 mg total) by mouth at bedtime.   diazepam 5 MG tablet Commonly known as:  VALIUM Take 1-2 tablets (5-10 mg total) by mouth every 6 (six) hours as needed for muscle spasms.   dicyclomine 20 MG tablet Commonly known as:  BENTYL Take 1 tablet (20 mg total) by mouth 3 (three) times daily before meals.   lamoTRIgine 100 MG tablet Commonly known as:  LAMICTAL Take 200 mg by mouth at bedtime.   omeprazole 20 MG capsule Commonly known as:  PRILOSEC Take 1 capsule (20 mg total) by mouth daily.   Oxycodone HCl 10 MG Tabs Take 1 tablet (10 mg total) by mouth every 3 (three) hours as needed for severe pain ((score 7 to 10)).   sertraline 100 MG tablet Commonly known as:  ZOLOFT Take 200 mg by mouth at bedtime.   traMADol 50 MG tablet Commonly known  as:  ULTRAM Take 50 mg by mouth every 6 (six) hours.   TRI-LINYAH 0.18/0.215/0.25 MG-35 MCG tablet Generic drug:  Norgestimate-Ethinyl Estradiol Triphasic Take 1 tablet by mouth daily.            Durable Medical Equipment  (From admission, onward)         Start     Ordered   12/19/18 1718  DME Walker rolling  Once    Question:  Patient needs a walker to treat with the following  condition  Answer:  Spondylolisthesis   12/19/18 1717   12/19/18 1718  DME 3 n 1  Once     12/19/18 1717           Signed: Dorian HeckleJoseph D Travon Crochet, MD 12/20/2018, 6:47 AM

## 2018-12-20 NOTE — Discharge Instructions (Signed)

## 2018-12-20 NOTE — Evaluation (Signed)
Physical Therapy Evaluation Patient Details Name: Dawn King MRN: [id number] DOB: May 16, 1991 Today's Date: 12/20/2018   History of Present Illness  Patient is a 28 y/o female who presents s/p L5-S1 PLIF. PMH includes concussions, anxiety, depression.  Clinical Impression  Patient presents with pain and post surgical deficits s/p above. Tolerated bed mobility, transfers and gait training with min-Mod A for balance/safety. Pt limited mainly by pain- crying and tearful throughout activity. Pt independent PTA and lives with parents. Works for [employer]. Education re: back precautions, log roll, brace, positioning, exercise etc. Will have support at home from mother. Will follow acutely to maximize independence and mobility prior to return home.     Follow Up Recommendations No PT follow up;Supervision for mobility/OOB    Equipment Recommendations  Rolling walker with 5" wheels    Recommendations for Other Services       Precautions / Restrictions Precautions Precautions: Back Precaution Booklet Issued: No Precaution Comments: Reviewed back precautions Required Braces or Orthoses: Spinal Brace Spinal Brace: Lumbar corset;Applied in sitting position Restrictions Weight Bearing Restrictions: No      Mobility  Bed Mobility Overal bed mobility: Needs Assistance Bed Mobility: Rolling;Sidelying to Sit;Sit to Sidelying Rolling: Min guard Sidelying to sit: Mod assist;HOB elevated     Sit to sidelying: Min guard;HOB elevated General bed mobility comments: Cues for log roll technique; use of rail and assist to elevate trunk. Able to bring LEs into bed to return to sidelying.  Transfers Overall transfer level: Needs assistance Equipment used: Rolling walker (2 wheeled) Transfers: Sit to/from Stand Sit to Stand: Min assist         General transfer comment: Assist to power to standing with cues for hand placement/technique. Climbing up LEs to stand. Screaming in  pain.  Ambulation/Gait Ambulation/Gait assistance: Min assist Gait Distance (Feet): 300 Feet Assistive device: Rolling walker (2 wheeled) Gait Pattern/deviations: Step-through pattern;Decreased stride length;Wide base of support Gait velocity: decreased Gait velocity interpretation: <1.31 ft/sec, indicative of household ambulator General Gait Details: Slow, guarded gait with HHA for support. Pt crying due to pain. Shaking through RUE as well.   Stairs            Wheelchair Mobility    Modified Rankin (Stroke Patients Only)       Balance Overall balance assessment: Needs assistance Sitting-balance support: Feet supported;Bilateral upper extremity supported Sitting balance-Leahy Scale: Fair Sitting balance - Comments: Requires UE support in sitting due to pain. Able to donn brace wtih setup   Standing balance support: During functional activity Standing balance-Leahy Scale: Fair Standing balance comment: Able to stand statically without UE support, requires UE support for dynamic balance, mostly due to pain.                             Pertinent Vitals/Pain Pain Assessment: 0-10 Pain Score: 10-Worst pain ever Pain Location: back, BLEs Pain Descriptors / Indicators: Operative site guarding;Sore;Aching;Shooting;Sharp Pain Intervention(s): Monitored during session;Repositioned;RN gave pain meds during session;Limited activity within patient's tolerance;Utilized relaxation techniques    Home Living Family/patient expects to be discharged to:: Private residence Living Arrangements: Parent Available Help at Discharge: Family;Available 24 hours/day Type of Home: House Home Access: Stairs to enter Entrance Stairs-Rails: Right Entrance Stairs-Number of Steps: 2 Home Layout: One level Home Equipment: None      Prior Function Level of Independence: Independent         Comments: 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 Loves to  read.      Hand Dominance    Dominant Hand: Right    Extremity/Trunk Assessment   Upper Extremity Assessment Upper Extremity Assessment: Defer to OT evaluation    Lower Extremity Assessment Lower Extremity Assessment: Overall WFL for tasks assessed RLE Sensation: WNL LLE Sensation: WNL    Cervical / Trunk Assessment Cervical / Trunk Assessment: Other exceptions Cervical / Trunk Exceptions: s/p spine surgery  Communication   Communication: No difficulties  Cognition Arousal/Alertness: Awake/alert Behavior During Therapy: Anxious Overall Cognitive Status: Within Functional Limits for tasks assessed                                 General Comments: Tearful throughout due to pain. Cues to keep eyes opened.       General Comments General comments (skin integrity, edema, etc.): Dad present during session.    Exercises     Assessment/Plan    PT Assessment Patient needs continued PT services  PT Problem List Decreased strength;Decreased mobility;Pain;Decreased balance;Decreased skin integrity;Decreased knowledge of precautions       PT Treatment Interventions Functional mobility training;Balance training;Patient/family education;Gait training;Therapeutic activities;Stair training;Therapeutic exercise;DME instruction    PT Goals (Current goals can be found in the Care Plan section)  Acute Rehab PT Goals Patient Stated Goal: to get this pain under control PT Goal Formulation: With patient Time For Goal Achievement: 01/03/19 Potential to Achieve Goals: Good    Frequency Min 5X/week   Barriers to discharge        Co-evaluation               AM-PAC PT "6 Clicks" Mobility  Outcome Measure Help needed turning from your back to your side while in a flat bed without using bedrails?: A Little Help needed moving from lying on your back to sitting on the side of a flat bed without using bedrails?: A Lot Help needed moving to and from a bed to a chair (including a wheelchair)?: A  Little Help needed standing up from a chair using your arms (e.g., wheelchair or bedside chair)?: A Little Help needed to walk in hospital room?: A Little Help needed climbing 3-5 steps with a railing? : A Little 6 Click Score: 17    End of Session Equipment Utilized During Treatment: Gait belt Activity Tolerance: Patient limited by pain Patient left: in bed;with call bell/phone within reach;with family/visitor present Nurse Communication: Mobility status PT Visit Diagnosis: Pain;Difficulty in walking, not elsewhere classified (R26.2) Pain - Right/Left: (bil) Pain - part of body: Leg    Time: 1610-9604 PT Time Calculation (min) (ACUTE ONLY): 29 min   Charges:   PT Evaluation $PT Eval Moderate Complexity: 1 Mod PT Treatments $Gait Training: 8-22 mins        Mylo Red, PT, DPT Acute Rehabilitation Services Pager [phone] Office [phone]      Blake Divine A Lanier Ensign 12/20/2018, 8:28 AM

## 2018-12-20 NOTE — Progress Notes (Signed)
Subjective: Patient reports still quite sore  Objective: Vital signs in last 24 hours: Temp:  [97.5 F (36.4 C)-98.5 F (36.9 C)] 97.9 F (36.6 C) (02/08 0430) Pulse Rate:  [67-98] 82 (02/08 0430) Resp:  [12-20] 20 (02/08 0430) BP: (118-142)/(60-85) 119/69 (02/08 0430) SpO2:  [94 %-98 %] 96 % (02/08 0430) Weight:  [115.7 kg] 115.7 kg (02/07 1117)  Intake/Output from previous day: 02/07 0701 - 02/08 0700 In: 2410 [P.O.:360; I.V.:1800; IV Piggyback:250] Out: 350 [Urine:150; Blood:200] Intake/Output this shift: Total I/O In: 610 [P.O.:360; IV Piggyback:250] Out: -   Physical Exam: Strength full. Dressing CDI  Lab Results: No results for input(s): WBC, HGB, HCT, PLT in the last 72 hours. BMET No results for input(s): NA, K, CL, CO2, GLUCOSE, BUN, CREATININE, CALCIUM in the last 72 hours.  Studies/Results: Dg Lumbar Spine 2-3 Views  Result Date: 12/19/2018 CLINICAL DATA:  Lumbar fusion EXAM: LUMBAR SPINE - 2-3 VIEW; DG C-ARM 61-120 MIN COMPARISON:  CT lumbar spine 08/15/2004 FINDINGS: Pedicle screw and interbody fusion at L5-S1. Grade 1 anterolisthesis L5-S1. IMPRESSION: PLIF L5-S1 Electronically Signed   By: Marlan Palau M.D.   On: 12/19/2018 15:50   Dg C-arm 1-60 Min  Result Date: 12/19/2018 CLINICAL DATA:  Lumbar fusion EXAM: LUMBAR SPINE - 2-3 VIEW; DG C-ARM 61-120 MIN COMPARISON:  CT lumbar spine 08/15/2004 FINDINGS: Pedicle screw and interbody fusion at L5-S1. Grade 1 anterolisthesis L5-S1. IMPRESSION: PLIF L5-S1 Electronically Signed   By: Marlan Palau M.D.   On: 12/19/2018 15:50    Assessment/Plan: Doing well after surgery.  Mobilize with PT.  Discharge home later this morning.      LOS: 1 day    Dorian Heckle, MD 12/20/2018, 6:42 AM

## 2018-12-22 ENCOUNTER — Other Ambulatory Visit: Payer: Self-pay | Admitting: Obstetrics and Gynecology

## 2018-12-22 DIAGNOSIS — Z803 Family history of malignant neoplasm of breast: Secondary | ICD-10-CM

## 2018-12-24 MED FILL — Sodium Chloride IV Soln 0.9%: INTRAVENOUS | Qty: 1000 | Status: AC

## 2018-12-24 MED FILL — Heparin Sodium (Porcine) Inj 1000 Unit/ML: INTRAMUSCULAR | Qty: 30 | Status: AC

## 2018-12-29 NOTE — Anesthesia Postprocedure Evaluation (Signed)
Anesthesia Post Note  Patient: Dawn King  Procedure(s) Performed: POSTERIOR LUMBAR INTERBODY FUSION - LUMBAR FIVE-SACRAL ONE (N/A Back)     Patient location during evaluation: PACU Anesthesia Type: General Level of consciousness: awake and alert Pain management: pain level controlled Vital Signs Assessment: post-procedure vital signs reviewed and stable Respiratory status: spontaneous breathing, nonlabored ventilation, respiratory function stable and patient connected to nasal cannula oxygen Cardiovascular status: blood pressure returned to baseline and stable Postop Assessment: no apparent nausea or vomiting Anesthetic complications: no    Last Vitals:  Vitals:   12/20/18 0710 12/20/18 0741  BP: (!) 91/55 (!) 100/53  Pulse: 86 87  Resp: 16   Temp: 36.8 C   SpO2: 99%     Last Pain:  Vitals:   12/20/18 0924  TempSrc:   PainSc: 5                  Doris Mcgilvery

## 2019-01-07 DIAGNOSIS — Z1329 Encounter for screening for other suspected endocrine disorder: Secondary | ICD-10-CM | POA: Diagnosis not present

## 2019-01-07 DIAGNOSIS — Z13228 Encounter for screening for other metabolic disorders: Secondary | ICD-10-CM | POA: Diagnosis not present

## 2019-01-07 DIAGNOSIS — Z13220 Encounter for screening for lipoid disorders: Secondary | ICD-10-CM | POA: Diagnosis not present

## 2019-01-07 DIAGNOSIS — Z1321 Encounter for screening for nutritional disorder: Secondary | ICD-10-CM | POA: Diagnosis not present

## 2019-01-22 ENCOUNTER — Ambulatory Visit
Admission: RE | Admit: 2019-01-22 | Discharge: 2019-01-22 | Disposition: A | Discharge status: Home / Self Care | Payer: BLUE CROSS/BLUE SHIELD | Source: Ambulatory Visit | Attending: Obstetrics and Gynecology | Admitting: Obstetrics and Gynecology

## 2019-01-22 ENCOUNTER — Other Ambulatory Visit: Payer: Self-pay

## 2019-01-22 DIAGNOSIS — N6322 Unspecified lump in the left breast, upper inner quadrant: Secondary | ICD-10-CM | POA: Diagnosis not present

## 2019-01-22 DIAGNOSIS — M4317 Spondylolisthesis, lumbosacral region: Secondary | ICD-10-CM | POA: Diagnosis not present

## 2019-01-22 DIAGNOSIS — N6320 Unspecified lump in the left breast, unspecified quadrant: Secondary | ICD-10-CM

## 2019-03-25 DIAGNOSIS — M4317 Spondylolisthesis, lumbosacral region: Secondary | ICD-10-CM | POA: Diagnosis not present

## 2019-05-06 DIAGNOSIS — F419 Anxiety disorder, unspecified: Secondary | ICD-10-CM | POA: Diagnosis not present

## 2019-05-06 DIAGNOSIS — F341 Dysthymic disorder: Secondary | ICD-10-CM | POA: Diagnosis not present

## 2019-06-12 DIAGNOSIS — W57XXXA Bitten or stung by nonvenomous insect and other nonvenomous arthropods, initial encounter: Secondary | ICD-10-CM | POA: Diagnosis not present

## 2019-06-12 DIAGNOSIS — Z1159 Encounter for screening for other viral diseases: Secondary | ICD-10-CM | POA: Diagnosis not present

## 2019-06-12 DIAGNOSIS — M545 Low back pain: Secondary | ICD-10-CM | POA: Diagnosis not present

## 2019-06-15 ENCOUNTER — Other Ambulatory Visit: Payer: Self-pay | Admitting: Student

## 2019-06-15 DIAGNOSIS — M4317 Spondylolisthesis, lumbosacral region: Secondary | ICD-10-CM

## 2019-06-19 ENCOUNTER — Emergency Department (HOSPITAL_COMMUNITY)
Admission: EM | Admit: 2019-06-19 | Discharge: 2019-06-19 | Disposition: A | Discharge status: Home / Self Care | Payer: BC Managed Care – PPO | Attending: Emergency Medicine | Admitting: Emergency Medicine

## 2019-06-19 ENCOUNTER — Encounter (HOSPITAL_COMMUNITY): Payer: Self-pay | Admitting: Emergency Medicine

## 2019-06-19 ENCOUNTER — Emergency Department (HOSPITAL_COMMUNITY): Payer: BC Managed Care – PPO

## 2019-06-19 ENCOUNTER — Other Ambulatory Visit: Payer: Self-pay

## 2019-06-19 DIAGNOSIS — R109 Unspecified abdominal pain: Secondary | ICD-10-CM | POA: Diagnosis not present

## 2019-06-19 DIAGNOSIS — M5442 Lumbago with sciatica, left side: Secondary | ICD-10-CM | POA: Diagnosis not present

## 2019-06-19 DIAGNOSIS — G8929 Other chronic pain: Secondary | ICD-10-CM | POA: Diagnosis not present

## 2019-06-19 DIAGNOSIS — R1032 Left lower quadrant pain: Secondary | ICD-10-CM | POA: Diagnosis not present

## 2019-06-19 DIAGNOSIS — Z79899 Other long term (current) drug therapy: Secondary | ICD-10-CM | POA: Diagnosis not present

## 2019-06-19 LAB — URINALYSIS, ROUTINE W REFLEX MICROSCOPIC
Bilirubin Urine: NEGATIVE
Glucose, UA: NEGATIVE mg/dL
Ketones, ur: NEGATIVE mg/dL
Nitrite: NEGATIVE
Protein, ur: NEGATIVE mg/dL
Specific Gravity, Urine: 1.016 (ref 1.005–1.030)
pH: 6 (ref 5.0–8.0)

## 2019-06-19 LAB — CBC
HCT: 37.7 % (ref 36.0–46.0)
Hemoglobin: 11.7 g/dL — ABNORMAL LOW (ref 12.0–15.0)
MCH: 22.8 pg — ABNORMAL LOW (ref 26.0–34.0)
MCHC: 31 g/dL (ref 30.0–36.0)
MCV: 73.5 fL — ABNORMAL LOW (ref 80.0–100.0)
Platelets: 499 10*3/uL — ABNORMAL HIGH (ref 150–400)
RBC: 5.13 MIL/uL — ABNORMAL HIGH (ref 3.87–5.11)
RDW: 16.4 % — ABNORMAL HIGH (ref 11.5–15.5)
WBC: 12.6 10*3/uL — ABNORMAL HIGH (ref 4.0–10.5)
nRBC: 0 % (ref 0.0–0.2)

## 2019-06-19 LAB — COMPREHENSIVE METABOLIC PANEL
ALT: 11 U/L (ref 0–44)
AST: 11 U/L — ABNORMAL LOW (ref 15–41)
Albumin: 3.2 g/dL — ABNORMAL LOW (ref 3.5–5.0)
Alkaline Phosphatase: 87 U/L (ref 38–126)
Anion gap: 13 (ref 5–15)
BUN: 10 mg/dL (ref 6–20)
CO2: 24 mmol/L (ref 22–32)
Calcium: 9 mg/dL (ref 8.9–10.3)
Chloride: 101 mmol/L (ref 98–111)
Creatinine, Ser: 0.72 mg/dL (ref 0.44–1.00)
GFR calc Af Amer: >60 mL/min (ref >60)
GFR calc non Af Amer: >60 mL/min (ref >60)
Glucose, Bld: 133 mg/dL — ABNORMAL HIGH (ref 70–99)
Potassium: 3.7 mmol/L (ref 3.5–5.1)
Sodium: 138 mmol/L (ref 135–145)
Total Bilirubin: 0.4 mg/dL (ref 0.3–1.2)
Total Protein: 7.2 g/dL (ref 6.5–8.1)

## 2019-06-19 LAB — I-STAT BETA HCG BLOOD, ED (MC, WL, AP ONLY): I-stat hCG, quantitative: <5 m[IU]/mL (ref <5)

## 2019-06-19 LAB — LIPASE, BLOOD: Lipase: 17 U/L (ref 11–51)

## 2019-06-19 MED ORDER — CYCLOBENZAPRINE HCL 10 MG PO TABS: 10.0000 mg | ORAL_TABLET | Freq: Two times a day (BID) | ORAL | 0 refills | Status: DC | PRN

## 2019-06-19 MED ORDER — METHOCARBAMOL 1000 MG/10ML IJ SOLN
1000.0000 mg | Freq: Once | INTRAMUSCULAR | Status: DC
  Filled 2019-06-19: qty 10

## 2019-06-19 MED ORDER — HYDROMORPHONE HCL 1 MG/ML IJ SOLN
1.0000 mg | Freq: Once | INTRAMUSCULAR | Status: AC
  Administered 2019-06-19: 1 mg via INTRAVENOUS
  Filled 2019-06-19: qty 1

## 2019-06-19 MED ORDER — SODIUM CHLORIDE 0.9% FLUSH
3.0000 mL | Freq: Once | INTRAVENOUS | Status: AC
  Administered 2019-06-19: 3 mL via INTRAVENOUS

## 2019-06-19 MED ORDER — CYCLOBENZAPRINE HCL 10 MG PO TABS: 10.0000 mg | ORAL_TABLET | Freq: Three times a day (TID) | ORAL | 2 refills | Status: DC | PRN

## 2019-06-19 MED ORDER — METHOCARBAMOL 1000 MG/10ML IJ SOLN
1000.0000 mg | Freq: Once | INTRAVENOUS | Status: AC
  Administered 2019-06-19: 1000 mg via INTRAVENOUS
  Filled 2019-06-19: qty 10

## 2019-06-19 MED ORDER — SODIUM CHLORIDE 0.9 % IV SOLN
INTRAVENOUS | Status: DC
  Administered 2019-06-19: 17:00:00 via INTRAVENOUS

## 2019-06-19 MED ORDER — METHYLPREDNISOLONE SODIUM SUCC 125 MG IJ SOLR
125.0000 mg | Freq: Once | INTRAMUSCULAR | Status: AC
  Administered 2019-06-19: 125 mg via INTRAVENOUS
  Filled 2019-06-19: qty 2

## 2019-06-19 MED ORDER — HYDROMORPHONE HCL 1 MG/ML IJ SOLN
0.5000 mg | Freq: Once | INTRAMUSCULAR | Status: AC
  Administered 2019-06-19: 0.5 mg via INTRAVENOUS
  Filled 2019-06-19: qty 1

## 2019-06-19 NOTE — ED Notes (Signed)
Patient verbalizes understanding of discharge instructions. Opportunity for questioning and answers were provided. Armband removed by staff, pt discharged from ED ambulatory to home.  

## 2019-06-19 NOTE — ED Provider Notes (Signed)
[facility] EMERGENCY DEPARTMENT Provider Note   CSN: [id number] Arrival date & time: 06/19/19  1205     History   Chief Complaint Chief Complaint  Patient presents with  . Flank Pain    HPI Dawn King is a 28 y.o. female.     HPI  28 year old female status post L5-S1 fusion December 19, 2018 presents today with increasing left low back pain over the past week.  She reports no recent injury or trauma.  She states she began having some low back pain is now radiating down her leg and is severe.  She is taking hydrocodone without relief.  She has contacted her surgeon who tentatively order an outpatient CT.  She was told she had to wait as I currently tested and performed and was not resulted.  However, she has no headache would like symptoms.  She was seen in urgent care.  She denies any numbness, tingling, headache, head injury, fever, chills, dyspnea, nausea, vomiting, diarrhea.  She reports some increased frequency of urination.  She is on oral birth control pills.   Past Medical History:  Diagnosis Date  . Anxiety   . Depression   . Family history of adverse reaction to anesthesia    maternal aunt had difficulty waken up.   . H/O multiple concussions   . Heart murmur    as a child; had a heart defect.   . Pneumonia   . PONV (postoperative nausea and vomiting)   . Trauma 09/2010   cracked liver from being "stomped on by a horse".    Patient Active Problem List   Diagnosis Date Noted  . Spondylolisthesis 12/19/2018  . MONONUCLEOSIS 05/23/2009  . LEUKOCYTOSIS 05/23/2009  . CARDIAC MURMUR 05/23/2009  . PERITONSILLAR ABSCESS 04/28/2009    Past Surgical History:  Procedure Laterality Date  . ADENOIDECTOMY    . BREAST SURGERY Right 2002   crushed artery from "being stomped on by a horse"...had to have a drain put in to drain blood.   . TONSILLECTOMY       OB History   No obstetric history on file.      Home Medications    Prior to  Admission medications   Medication Sig Start Date End Date Taking? Authorizing Provider  ALPRAZolam Duanne Moron) 0.5 MG tablet Take 0.5 mg by mouth at bedtime as needed for sleep. 01/28/18   [provider]  cyclobenzaprine (FLEXERIL) 10 MG tablet Take 1 tablet (10 mg total) by mouth 3 (three) times daily as needed for muscle spasms. 12/20/18   Costella, Vista Mink, PA-C  cyclobenzaprine (FLEXERIL) 5 MG tablet Take 1 tablet (5 mg total) by mouth at bedtime. Patient not taking: Reported on 11/07/2018 08/03/18   Augusto Gamble B, NP  diazepam (VALIUM) 5 MG tablet Take 1-2 tablets (5-10 mg total) by mouth every 6 (six) hours as needed for muscle spasms. 12/20/18   Erline Levine, MD  dicyclomine (BENTYL) 20 MG tablet Take 1 tablet (20 mg total) by mouth 3 (three) times daily before meals. Patient not taking: Reported on 11/07/2018 03/10/18   Duanne Guess, PA-C  lamoTRIgine (LAMICTAL) 100 MG tablet Take 200 mg by mouth at bedtime.     [provider]  omeprazole (PRILOSEC) 20 MG capsule Take 1 capsule (20 mg total) by mouth daily. Patient not taking: Reported on 11/07/2018 03/10/18   Duanne Guess, PA-C  oxyCODONE 10 MG TABS Take 1 tablet (10 mg total) by mouth every 3 (three) hours  as needed for severe pain ((score 7 to 10)). 12/20/18   Maeola HarmanStern, Joseph, MD  sertraline (ZOLOFT) 100 MG tablet Take 200 mg by mouth at bedtime.     [provider]  traMADol (ULTRAM) 50 MG tablet Take 50 mg by mouth every 6 (six) hours.    [provider]  TRI-LINYAH 0.18/0.215/0.25 MG-35 MCG tablet Take 1 tablet by mouth daily. 06/11/16   [provider]    Family History No family history on file.  Social History Social History   Tobacco Use  . Smoking status: Never Smoker  . Smokeless tobacco: Never Used  Substance Use Topics  . Alcohol use: Yes    Comment: Occasionally   . Drug use: Yes    Types: Marijuana    Comment: last time was 09/2018.      Allergies   Levaquin  [levofloxacin], Cefaclor, Diphenhydramine, Hydroxyzine, Percocet [oxycodone-acetaminophen], Sulfonamide derivatives, Tramadol, Other, Sunflower oil, Clindamycin/lincomycin, Codeine, Keflex [cephalexin], Morphine and related, and Zofran [ondansetron]   Review of Systems Review of Systems  All other systems reviewed and are negative.    Physical Exam Updated Vital Signs BP 139/71 (BP Location: Left Arm)   Pulse 92   Temp 98.7 F (37.1 C) (Oral)   Resp 16   Ht 1.651 m (5\' 5" )   Wt 113.4 kg   SpO2 100%   BMI 41.60 kg/m   Physical Exam Vitals signs and nursing note reviewed.  Constitutional:      Appearance: Normal appearance. She is obese.  HENT:     Head: Normocephalic and atraumatic.     Nose: Nose normal.     Mouth/Throat:     Mouth: Mucous membranes are moist.  Eyes:     Pupils: Pupils are equal, round, and reactive to light.  Neck:     Musculoskeletal: Normal range of motion.  Cardiovascular:     Rate and Rhythm: Normal rate and regular rhythm.  Pulmonary:     Effort: Pulmonary effort is normal.     Breath sounds: Normal breath sounds.  Abdominal:     General: Abdomen is flat. Bowel sounds are normal. There is no distension.     Palpations: Abdomen is soft.  Musculoskeletal: Normal range of motion.        General: No swelling, tenderness, deformity or signs of injury.     Right lower leg: No edema.  Skin:    General: Skin is warm and dry.     Capillary Refill: Capillary refill takes less than 2 seconds.  Neurological:     General: No focal deficit present.     Mental Status: She is alert and oriented to person, place, and time.     Motor: No weakness.     Coordination: Coordination normal.     Gait: Gait normal.     Deep Tendon Reflexes: Reflexes normal.  Psychiatric:        Mood and Affect: Mood normal.        Behavior: Behavior normal.      ED Treatments / Results  Labs (all labs ordered are listed, but only abnormal results are displayed) Labs  Reviewed  COMPREHENSIVE METABOLIC PANEL - Abnormal; Notable for the following components:      Result Value   Glucose, Bld 133 (*)    Albumin 3.2 (*)    AST 11 (*)    All other components within normal limits  CBC - Abnormal; Notable for the following components:   WBC 12.6 (*)  RBC 5.13 (*)    Hemoglobin 11.7 (*)    MCV 73.5 (*)    MCH 22.8 (*)    RDW 16.4 (*)    Platelets 499 (*)    All other components within normal limits  LIPASE, BLOOD  URINALYSIS, ROUTINE W REFLEX MICROSCOPIC  I-STAT BETA HCG BLOOD, ED ([facility], [facility], [facility] ONLY)    EKG None  Radiology No results found.  Procedures Procedures (including critical care time)  Medications Ordered in ED Medications  sodium chloride flush (NS) 0.9 % injection 3 mL (has no administration in time range)  0.9 %  sodium chloride infusion (has no administration in time range)  methylPREDNISolone sodium succinate (SOLU-MEDROL) 125 mg/2 mL injection 125 mg (has no administration in time range)  methocarbamol (ROBAXIN) injection 1,000 mg (has no administration in time range)  HYDROmorphone (DILAUDID) injection 0.5 mg (has no administration in time range)     Initial Impression / Assessment and Plan / ED Course  I have reviewed the triage vital signs and the nursing notes.  Pertinent labs & imaging results that were available during my care of the patient were reviewed by me and considered in my medical decision making (see chart for details).       Patient presents today with left flank pain most consistent with her low back pain.  Discussed patient's care with Dr. Dutch QuintPoole.  She did call the office yesterday for refill of her narcotics which was denied. Patient had some mild hematuria and is not reporting any vaginal bleeding.  Secondary to this a CT stone study was obtained which shows no evidence of renal stone.She is advised regarding return precautions and need for follow-up and voices understanding Final Clinical Impressions(s)  / ED Diagnoses   Final diagnoses:  Chronic left-sided low back pain with left-sided sciatica    ED Discharge Orders    None       Margarita Grizzleay, Noriel Guthrie, MD 06/19/19 Kristopher [id number]

## 2019-06-19 NOTE — ED Triage Notes (Signed)
PT states L lower back that radiates to LLQ with urinary frequency.  Was recently tested for [place] spotted fever - awaiting results, but is being tx for that.

## 2019-07-02 ENCOUNTER — Ambulatory Visit
Admission: RE | Admit: 2019-07-02 | Discharge: 2019-07-02 | Disposition: A | Discharge status: Home / Self Care | Payer: BC Managed Care – PPO | Source: Ambulatory Visit | Attending: Student | Admitting: Student

## 2019-07-02 DIAGNOSIS — M5126 Other intervertebral disc displacement, lumbar region: Secondary | ICD-10-CM | POA: Diagnosis not present

## 2019-07-02 DIAGNOSIS — M4317 Spondylolisthesis, lumbosacral region: Secondary | ICD-10-CM

## 2019-07-08 DIAGNOSIS — M4317 Spondylolisthesis, lumbosacral region: Secondary | ICD-10-CM | POA: Diagnosis not present

## 2019-07-08 DIAGNOSIS — Z6841 Body Mass Index (BMI) 40.0 and over, adult: Secondary | ICD-10-CM | POA: Diagnosis not present

## 2019-07-08 DIAGNOSIS — R030 Elevated blood-pressure reading, without diagnosis of hypertension: Secondary | ICD-10-CM | POA: Diagnosis not present

## 2019-07-30 DIAGNOSIS — M545 Low back pain: Secondary | ICD-10-CM | POA: Diagnosis not present

## 2019-07-30 DIAGNOSIS — M4317 Spondylolisthesis, lumbosacral region: Secondary | ICD-10-CM | POA: Diagnosis not present

## 2019-07-30 DIAGNOSIS — Z981 Arthrodesis status: Secondary | ICD-10-CM | POA: Diagnosis not present

## 2019-08-05 DIAGNOSIS — M4317 Spondylolisthesis, lumbosacral region: Secondary | ICD-10-CM | POA: Diagnosis not present

## 2019-08-05 DIAGNOSIS — M533 Sacrococcygeal disorders, not elsewhere classified: Secondary | ICD-10-CM | POA: Diagnosis not present

## 2019-08-05 DIAGNOSIS — R030 Elevated blood-pressure reading, without diagnosis of hypertension: Secondary | ICD-10-CM | POA: Diagnosis not present

## 2019-08-05 DIAGNOSIS — Z6841 Body Mass Index (BMI) 40.0 and over, adult: Secondary | ICD-10-CM | POA: Diagnosis not present

## 2019-08-26 DIAGNOSIS — F419 Anxiety disorder, unspecified: Secondary | ICD-10-CM | POA: Diagnosis not present

## 2019-08-26 DIAGNOSIS — F341 Dysthymic disorder: Secondary | ICD-10-CM | POA: Diagnosis not present

## 2019-08-27 DIAGNOSIS — M545 Low back pain: Secondary | ICD-10-CM | POA: Diagnosis not present

## 2019-08-27 DIAGNOSIS — G8929 Other chronic pain: Secondary | ICD-10-CM | POA: Diagnosis not present

## 2019-08-27 DIAGNOSIS — M461 Sacroiliitis, not elsewhere classified: Secondary | ICD-10-CM | POA: Diagnosis not present

## 2019-09-02 DIAGNOSIS — M461 Sacroiliitis, not elsewhere classified: Secondary | ICD-10-CM | POA: Diagnosis not present

## 2019-09-16 DIAGNOSIS — F419 Anxiety disorder, unspecified: Secondary | ICD-10-CM | POA: Diagnosis not present

## 2019-09-16 DIAGNOSIS — F341 Dysthymic disorder: Secondary | ICD-10-CM | POA: Diagnosis not present

## 2019-09-23 DIAGNOSIS — R030 Elevated blood-pressure reading, without diagnosis of hypertension: Secondary | ICD-10-CM | POA: Diagnosis not present

## 2019-09-23 DIAGNOSIS — M461 Sacroiliitis, not elsewhere classified: Secondary | ICD-10-CM | POA: Diagnosis not present

## 2019-09-23 DIAGNOSIS — Z6825 Body mass index (BMI) 25.0-25.9, adult: Secondary | ICD-10-CM | POA: Diagnosis not present

## 2019-09-23 DIAGNOSIS — M533 Sacrococcygeal disorders, not elsewhere classified: Secondary | ICD-10-CM | POA: Diagnosis not present

## 2019-09-29 DIAGNOSIS — F419 Anxiety disorder, unspecified: Secondary | ICD-10-CM | POA: Diagnosis not present

## 2019-09-29 DIAGNOSIS — F341 Dysthymic disorder: Secondary | ICD-10-CM | POA: Diagnosis not present

## 2019-10-20 DIAGNOSIS — F341 Dysthymic disorder: Secondary | ICD-10-CM | POA: Diagnosis not present

## 2019-10-20 DIAGNOSIS — F419 Anxiety disorder, unspecified: Secondary | ICD-10-CM | POA: Diagnosis not present

## 2019-11-26 DIAGNOSIS — F419 Anxiety disorder, unspecified: Secondary | ICD-10-CM | POA: Diagnosis not present

## 2019-11-26 DIAGNOSIS — F341 Dysthymic disorder: Secondary | ICD-10-CM | POA: Diagnosis not present

## 2019-12-14 HISTORY — PX: SPINE SURGERY: SHX786

## 2019-12-16 DIAGNOSIS — F419 Anxiety disorder, unspecified: Secondary | ICD-10-CM | POA: Diagnosis not present

## 2019-12-16 DIAGNOSIS — F341 Dysthymic disorder: Secondary | ICD-10-CM | POA: Diagnosis not present

## 2019-12-29 DIAGNOSIS — F419 Anxiety disorder, unspecified: Secondary | ICD-10-CM | POA: Diagnosis not present

## 2019-12-29 DIAGNOSIS — F341 Dysthymic disorder: Secondary | ICD-10-CM | POA: Diagnosis not present

## 2020-01-12 DIAGNOSIS — F341 Dysthymic disorder: Secondary | ICD-10-CM | POA: Diagnosis not present

## 2020-01-12 DIAGNOSIS — F419 Anxiety disorder, unspecified: Secondary | ICD-10-CM | POA: Diagnosis not present

## 2020-01-28 DIAGNOSIS — F329 Major depressive disorder, single episode, unspecified: Secondary | ICD-10-CM | POA: Diagnosis not present

## 2020-01-28 DIAGNOSIS — F419 Anxiety disorder, unspecified: Secondary | ICD-10-CM | POA: Diagnosis not present

## 2020-03-01 ENCOUNTER — Other Ambulatory Visit: Payer: Self-pay

## 2020-03-01 ENCOUNTER — Encounter: Payer: Self-pay | Admitting: Psychiatry

## 2020-03-01 ENCOUNTER — Encounter (INDEPENDENT_AMBULATORY_CARE_PROVIDER_SITE_OTHER): Payer: Self-pay

## 2020-03-01 ENCOUNTER — Ambulatory Visit (INDEPENDENT_AMBULATORY_CARE_PROVIDER_SITE_OTHER): Payer: BC Managed Care – PPO | Admitting: Psychiatry

## 2020-03-01 VITALS — BP 121/76 | HR 81

## 2020-03-01 DIAGNOSIS — F39 Unspecified mood [affective] disorder: Secondary | ICD-10-CM

## 2020-03-01 DIAGNOSIS — F419 Anxiety disorder, unspecified: Secondary | ICD-10-CM

## 2020-03-01 DIAGNOSIS — F900 Attention-deficit hyperactivity disorder, predominantly inattentive type: Secondary | ICD-10-CM

## 2020-03-01 MED ORDER — METHYLPHENIDATE HCL ER (OSM) 36 MG PO TBCR: 36.0000 mg | EXTENDED_RELEASE_TABLET | Freq: Every day | ORAL | 0 refills | Status: DC
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
## 2020-03-29 ENCOUNTER — Encounter: Payer: Self-pay | Admitting: Psychiatry

## 2020-03-29 ENCOUNTER — Ambulatory Visit (INDEPENDENT_AMBULATORY_CARE_PROVIDER_SITE_OTHER): Payer: BC Managed Care – PPO | Admitting: Psychiatry

## 2020-03-29 ENCOUNTER — Other Ambulatory Visit: Payer: Self-pay

## 2020-03-29 VITALS — BP 135/84 | HR 85

## 2020-03-29 DIAGNOSIS — Z01419 Encounter for gynecological examination (general) (routine) without abnormal findings: Secondary | ICD-10-CM | POA: Diagnosis not present

## 2020-03-29 DIAGNOSIS — G4700 Insomnia, unspecified: Secondary | ICD-10-CM

## 2020-03-29 DIAGNOSIS — N6320 Unspecified lump in the left breast, unspecified quadrant: Secondary | ICD-10-CM | POA: Diagnosis not present

## 2020-03-29 DIAGNOSIS — F900 Attention-deficit hyperactivity disorder, predominantly inattentive type: Secondary | ICD-10-CM

## 2020-03-29 DIAGNOSIS — Z6841 Body Mass Index (BMI) 40.0 and over, adult: Secondary | ICD-10-CM | POA: Diagnosis not present

## 2020-03-29 MED ORDER — TRAZODONE HCL 100 MG PO TABS: ORAL_TABLET | ORAL | 1 refills | Status: DC

## 2020-03-29 MED ORDER — METHYLPHENIDATE HCL ER (OSM) 27 MG PO TBCR: 27.0000 mg | EXTENDED_RELEASE_TABLET | Freq: Every day | ORAL | 0 refills | Status: DC

## 2020-03-29 NOTE — Progress Notes (Signed)
Dawn King [facility] [id number] [birth year] 28 y.o.  Subjective:   Patient ID:  Dawn King is a 29 y.o. (DOB 20-Apr-1991) female.  Chief Complaint:  Chief Complaint  Patient presents with  . ADD  . Follow-up    h/o anxiety and mood disturbance    HPI [name] presents to the office today for follow-up of ADD. She reports that she has noticed improved energy and focus. "I feel better." She reports that the first 2 weeks she had some difficulty sleeping. Reports that she had difficulty with sleep initiation and it would take hours to fall asleep. Sleep has improved with using marijuana. Reports that she fell asleep last night around 8 pm without assistance and has had a few other nights when she has slept well without marijuana. Has been taking Concerta upon awakening. She reports that Concerta remains effective throughout the day. She reports that she has been able "to juggle a lot more than before" and describes being better able to multi-task. She reports that repetitive movements have significantly improved. She reports that she continues to have sensitivty to sounds and textures. She reports that she has not lost anything recently other than one list. Left glasses at work one day. Reports that she has been better about making different lists so that she does not forget things.   Denies any recent anxiety and notices anxiety has been less frequent. She reports that prior to medication, current work stress would have caused significant anxiety. She reports that her mood has been improved and her mother has noticed she seems "more upbeat." denies depressed mood other than one evening of noticing some sad mood. She has had some irritability and is more easily annoyed by customers (ie, screaming children, customers coming in right before closing, etc). Reports that she is able to hide her irritation. Appetite has been decreased and reports that she is eating an adequate  amount. Continues to eat only one meal a day. Energy and motivation have been improved. Denies SI.   Denies any recent AH.   Past Psychiatric Medication Trials: Concerta- Helpful. Started in 5th grade. Took it sporadically about 5-6 years ago and not since. Does not recall side effects. Took 36 mg po qd. Adderall- Had loss of appetite. May have been XR. Sertraline- Has taken since 8th grade. Has been helpful for mood and anxiety.  Paxil- Took prior to 8th grade. Lamictal-Started 5 years ago. Has been helpful for depression and mood lability.  Hydroxyzine- allergic reaction Xanax- Intermittently effective Valium- prescribed for muscle spasms  May have taken ambien in HS and had excessive somnolence. May have taken Abilify in HS and states she does not remember an entire month.   Review of Systems:  Review of Systems  Cardiovascular: Negative for palpitations.  Gastrointestinal:       Notices nausea if she drinks coffee with concerta  Musculoskeletal: Negative for gait problem.  Neurological: Negative for tremors.  Psychiatric/Behavioral:       Please refer to HPI    Medications:  Current Outpatient Medications  Medication Sig Dispense Refill  . ALPRAZolam (XANAX) 0.5 MG tablet Take 0.5 mg by mouth at bedtime as needed for sleep.    Marland Kitchen ibuprofen (ADVIL) 200 MG tablet Take 400-600 mg by mouth every 6 (six) hours as needed (pain).    Marland Kitchen lamoTRIgine (LAMICTAL) 200 MG tablet Take 200 mg by mouth at bedtime.     Marland Kitchen MELATONIN PO Take by mouth.    . methylphenidate (CONCERTA)  27 MG PO CR tablet Take 1 tablet (27 mg total) by mouth daily for 7 days. 7 tablet 0  . Norgestimate-Ethinyl Estradiol Triphasic (TRI-SPRINTEC) 0.18/0.215/0.25 MG-35 MCG tablet Take 1 tablet by mouth at bedtime.    . sertraline (ZOLOFT) 100 MG tablet Take 200 mg by mouth at bedtime.     Marland Kitchen omeprazole (PRILOSEC) 20 MG capsule Take 1 capsule (20 mg total) by mouth daily. (Patient not taking: Reported on 03/29/2020) 30  capsule 0  . traZODone (DESYREL) 100 MG tablet Take 1/2-1 tablet po QHS prn insomnia 30 tablet 1   No current facility-administered medications for this visit.    Medication Side Effects: Sleep Problems  Allergies:  Allergies  Allergen Reactions  . Levaquin [Levofloxacin] Anaphylaxis and Rash  . Cefaclor Hives and Rash    severe rash  . Diphenhydramine Hives  . Hydroxyzine Hives  . Percocet [Oxycodone-Acetaminophen] Hives    12/19/18: patient reports that she tolerates Oxycodone (but not Percocet)  . Sulfonamide Derivatives Hives and Rash    severe rash  . Tramadol Nausea And Vomiting  . Other Itching    *Cantoloupe* makes mouth really itchy   . Sunflower Oil Itching    Mouth really itchy   . Clindamycin/Lincomycin Rash  . Codeine Rash  . Keflex [Cephalexin] Rash  . Morphine And Related Rash  . Zofran [Ondansetron] Rash    Past Medical History:  Diagnosis Date  . Anxiety   . Depression   . Family history of adverse reaction to anesthesia    maternal aunt had difficulty waken up.   . H/O multiple concussions   . Heart murmur    as a child; had a heart defect.   . Pneumonia   . PONV (postoperative nausea and vomiting)   . Trauma 09/2010   cracked liver from being "stomped on by a horse".    Family History  Problem Relation Age of Onset  . Depression Father   . Depression Sister   . Anxiety disorder Sister   . Depression Paternal Uncle   . Autism Cousin   . Autism Cousin     Social History   Socioeconomic History  . Marital status: Single    Spouse name: Not on file  . Number of children: Not on file  . Years of education: Not on file  . Highest education level: Not on file  Occupational History  . Not on file  Tobacco Use  . Smoking status: Former Games developer  . Smokeless tobacco: Never Used  Substance and Sexual Activity  . Alcohol use: Yes    Comment: Occasionally   . Drug use: Yes    Types: Marijuana    Comment: occ  . Sexual activity: Not on file   Other Topics Concern  . Not on file  Social History Narrative  . Not on file   Social Determinants of Health   Financial Resource Strain:   . Difficulty of Paying Living Expenses:   Food Insecurity:   . Worried About Programme researcher, broadcasting/film/video in the Last Year:   . Barista in the Last Year:   Transportation Needs:   . Freight forwarder (Medical):   Marland Kitchen Lack of Transportation (Non-Medical):   Physical Activity:   . Days of Exercise per Week:   . Minutes of Exercise per Session:   Stress:   . Feeling of Stress :   Social Connections:   . Frequency of Communication with Friends and Family:   . Frequency  of Social Gatherings with Friends and Family:   . Attends Religious Services:   . Active Member of Clubs or Organizations:   . Attends Banker Meetings:   Marland Kitchen Marital Status:   Intimate Partner Violence:   . Fear of Current or Ex-Partner:   . Emotionally Abused:   Marland Kitchen Physically Abused:   . Sexually Abused:     Past Medical History, Surgical history, Social history, and Family history were reviewed and updated as appropriate.   Please see review of systems for further details on the patient's review from today.   Objective:   Physical Exam:  BP 135/84   Pulse 85   Physical Exam Constitutional:      General: She is not in acute distress. Musculoskeletal:        General: No deformity.  Neurological:     Mental Status: She is alert and oriented to person, place, and time.     Coordination: Coordination normal.  Psychiatric:        Attention and Perception: Attention and perception normal. She does not perceive auditory or visual hallucinations.        Mood and Affect: Mood normal. Mood is not anxious or depressed. Affect is not labile, blunt, angry or inappropriate.        Speech: Speech normal.        Behavior: Behavior normal.        Thought Content: Thought content normal. Thought content is not paranoid or delusional. Thought content does not  include homicidal or suicidal ideation. Thought content does not include homicidal or suicidal plan.        Cognition and Memory: Cognition and memory normal.        Judgment: Judgment normal.     Comments: Insight intact     Lab Review:     Component Value Date/Time   NA 138 06/19/2019 1224   K 3.7 06/19/2019 1224   CL 101 06/19/2019 1224   CO2 24 06/19/2019 1224   GLUCOSE 133 (H) 06/19/2019 1224   BUN 10 06/19/2019 1224   CREATININE 0.72 06/19/2019 1224   CALCIUM 9.0 06/19/2019 1224   PROT 7.2 06/19/2019 1224   ALBUMIN 3.2 (L) 06/19/2019 1224   AST 11 (L) 06/19/2019 1224   ALT 11 06/19/2019 1224   ALKPHOS 87 06/19/2019 1224   BILITOT 0.4 06/19/2019 1224   GFRNONAA >60 06/19/2019 1224   GFRAA >60 06/19/2019 1224       Component Value Date/Time   WBC 12.6 (H) 06/19/2019 1224   RBC 5.13 (H) 06/19/2019 1224   HGB 11.7 (L) 06/19/2019 1224   HCT 37.7 06/19/2019 1224   PLT 499 (H) 06/19/2019 1224   MCV 73.5 (L) 06/19/2019 1224   MCH 22.8 (L) 06/19/2019 1224   MCHC 31.0 06/19/2019 1224   RDW 16.4 (H) 06/19/2019 1224   LYMPHSABS 2.2 12/09/2018 1136   MONOABS 0.5 12/09/2018 1136   EOSABS 0.1 12/09/2018 1136   BASOSABS 0.0 12/09/2018 1136    No results found for: POCLITH, LITHIUM   No results found for: PHENYTOIN, PHENOBARB, VALPROATE, CBMZ   .res Assessment: Plan:   Discussed decreasing Concerta to 27 mg daily since patient reports that she has had some worsening insomnia with Concerta 36 mg daily and describes Concerta 36 mg daily as effective and lasting throughout the day.  Will prescribe Concerta 27 mg daily for 1 week.  Advised patient to contact office in the next 5 to 7 days to discuss response to Concerta  27 mg daily and then script can be sent for either the 27 or 36 mg dose based upon response. Discussed potential benefits, risks, and side effects of Trazodone for insomnia. Pt agrees to trial of Trazodone. Will start Trazodone 100 mg 1/2-1 tab po QHS prn  insomnia.  Patient reports that she does not need any refills of sertraline for lamotrigine at this time and will contact office if she requires refills before next visit. Patient to follow-up with this provider in 4 weeks or sooner medically indicated. Patient advised to contact office with any questions, adverse effects, or acute worsening in signs and symptoms.   Jeannie was seen today for add and follow-up.  Diagnoses and all orders for this visit:  Insomnia, unspecified type -     traZODone (DESYREL) 100 MG tablet; Take 1/2-1 tablet po QHS prn insomnia  Attention deficit hyperactivity disorder (ADHD), predominantly inattentive type -     methylphenidate (CONCERTA) 27 MG PO CR tablet; Take 1 tablet (27 mg total) by mouth daily for 7 days.     Please see After Visit Summary for patient specific instructions.  Future Appointments  Date Time Provider [place]  04/26/2020  4:30 PM Thayer Headings, PMHNP CP-CP None    No orders of the defined types were placed in this encounter.   -------------------------------

## 2020-03-30 ENCOUNTER — Other Ambulatory Visit: Payer: Self-pay | Admitting: Obstetrics and Gynecology

## 2020-03-30 DIAGNOSIS — N63 Unspecified lump in unspecified breast: Secondary | ICD-10-CM

## 2020-04-06 ENCOUNTER — Other Ambulatory Visit: Payer: BC Managed Care – PPO

## 2020-04-19 ENCOUNTER — Other Ambulatory Visit: Payer: Self-pay

## 2020-04-19 ENCOUNTER — Ambulatory Visit
Admission: RE | Admit: 2020-04-19 | Discharge: 2020-04-19 | Disposition: A | Discharge status: Home / Self Care | Payer: BC Managed Care – PPO | Source: Ambulatory Visit | Attending: Obstetrics and Gynecology | Admitting: Obstetrics and Gynecology

## 2020-04-19 DIAGNOSIS — N63 Unspecified lump in unspecified breast: Secondary | ICD-10-CM

## 2020-04-19 DIAGNOSIS — N6322 Unspecified lump in the left breast, upper inner quadrant: Secondary | ICD-10-CM | POA: Diagnosis not present

## 2020-04-22 ENCOUNTER — Telehealth: Payer: Self-pay | Admitting: Psychiatry

## 2020-04-22 ENCOUNTER — Other Ambulatory Visit: Payer: Self-pay

## 2020-04-22 DIAGNOSIS — F900 Attention-deficit hyperactivity disorder, predominantly inattentive type: Secondary | ICD-10-CM

## 2020-04-22 MED ORDER — METHYLPHENIDATE HCL ER (OSM) 27 MG PO TBCR: 27.0000 mg | EXTENDED_RELEASE_TABLET | Freq: Every day | ORAL | 0 refills | Status: DC

## 2020-04-22 NOTE — Telephone Encounter (Signed)
Pt requesting a refill on the Concerta. Fill at the [facility] location.Appt scheduled for 6/15.

## 2020-04-22 NOTE — Telephone Encounter (Signed)
Pended for Shanda Bumps to approve

## 2020-04-26 ENCOUNTER — Telehealth (INDEPENDENT_AMBULATORY_CARE_PROVIDER_SITE_OTHER): Payer: BC Managed Care – PPO | Admitting: Psychiatry

## 2020-04-26 ENCOUNTER — Telehealth: Payer: Self-pay | Admitting: Psychiatry

## 2020-04-26 ENCOUNTER — Encounter: Payer: Self-pay | Admitting: Psychiatry

## 2020-04-26 DIAGNOSIS — G4700 Insomnia, unspecified: Secondary | ICD-10-CM | POA: Diagnosis not present

## 2020-04-26 DIAGNOSIS — F900 Attention-deficit hyperactivity disorder, predominantly inattentive type: Secondary | ICD-10-CM

## 2020-04-26 DIAGNOSIS — F39 Unspecified mood [affective] disorder: Secondary | ICD-10-CM

## 2020-04-26 MED ORDER — TRAZODONE HCL 100 MG PO TABS: ORAL_TABLET | ORAL | 1 refills | Status: DC

## 2020-04-26 MED ORDER — LAMOTRIGINE 200 MG PO TABS: 200.0000 mg | ORAL_TABLET | Freq: Every day | ORAL | 0 refills | Status: DC

## 2020-04-26 MED ORDER — METHYLPHENIDATE HCL ER (OSM) 36 MG PO TBCR: 36.0000 mg | EXTENDED_RELEASE_TABLET | Freq: Every day | ORAL | 0 refills | Status: DC

## 2020-04-26 MED ORDER — SERTRALINE HCL 100 MG PO TABS: 200.0000 mg | ORAL_TABLET | Freq: Every day | ORAL | 1 refills | Status: DC

## 2020-04-26 NOTE — Telephone Encounter (Signed)
Ms. vermell, [place] are scheduled for a virtual visit with your provider today.    Just as we do with appointments in the office, we must obtain your consent to participate.  Your consent will be active for this visit and any virtual visit you may have with one of our providers in the next 365 days.    If you have a MyChart account, I can also send a copy of this consent to you electronically.  All virtual visits are billed to your insurance company just like a traditional visit in the office.  As this is a virtual visit, video technology does not allow for your provider to perform a traditional examination.  This may limit your provider's ability to fully assess your condition.  If your provider identifies any concerns that need to be evaluated in person or the need to arrange testing such as labs, EKG, etc, we will make arrangements to do so.    Although advances in technology are sophisticated, we cannot ensure that it will always work on either your end or our end.  If the connection with a video visit is poor, we may have to switch to a telephone visit.  With either a video or telephone visit, we are not always able to ensure that we have a secure connection.   I need to obtain your verbal consent now.   Are you willing to proceed with your visit today?   [facility] has provided verbal consent on 04/26/2020 for a virtual visit (video or telephone).   Corie Chiquito, PMHNP 04/26/2020  4:50 PM

## 2020-04-26 NOTE — Progress Notes (Signed)
Dawn King [id number] 08/22/1991 28 y.o.  Virtual Visit via Video Note  I connected with pt @ on 04/26/20 at  4:30 PM EDT by a video enabled telemedicine application and verified that I am speaking with the correct person using two identifiers.   I discussed the limitations of evaluation and management by telemedicine and the availability of in person appointments. The patient expressed understanding and agreed to proceed.  I discussed the assessment and treatment plan with the patient. The patient was provided an opportunity to ask questions and all were answered. The patient agreed with the plan and demonstrated an understanding of the instructions.   The patient was advised to call back or seek an in-person evaluation if the symptoms worsen or if the condition fails to improve as anticipated.  I provided 30 minutes of non-face-to-face time during this encounter.  The patient was located at home.  The provider was located at Dawn King.   Dawn King, PMHNP   Subjective:   Patient ID:  Dawn King is a 29 y.o. (DOB [birth year]) female.  Chief Complaint:  Chief Complaint  Patient presents with  . ADD  . Follow-up    h/o anxiety, depression, and insomnia.     HPI Dawn King [facility] presents for follow-up of ADD, anxiety, and insomnia. She reports that lower dose of Concerta was not as effective. She reports that Concerta 27 mg was only partially effective. She noticed some difficulty counting money at work with lower dose. She reports that she continues to have some difficulty with insomnia with Concerta 27 mg po qd. She reports that her sleep has been adequate with Trazodone without adverse effects. Denies anxiety. Denies depressed mood. Appetite was ok prior to GI illness. Energy and motivation have been adequate. Denies SI.   Has not been taking Xanax prn recently  Past King Medication Trials: Concerta- Helpful. Started in 5th  grade. Took it sporadically about 5-6 years ago and not since. Does not recall side effects. Took 36 mg po qd. Adderall- Had loss of appetite. May have been XR. Sertraline- Has taken since 8th grade. Has been helpful for mood and anxiety.  Paxil- Took prior to 8th grade. Lamictal-Started 5 years ago. Has been helpful for depression and mood lability.  Hydroxyzine- allergic reaction Xanax- Intermittently effective Valium- prescribed for muscle spasms  May have taken ambien in HS and had excessive somnolence. May have taken Abilify in HS and states she does not remember an entire month.   Review of Systems:  Review of Systems  Cardiovascular: Negative for palpitations.  Gastrointestinal: Positive for diarrhea, nausea and vomiting.       Developed acute GI illness about 24 hours ago.   Musculoskeletal: Negative for gait problem.  Neurological: Negative for tremors.  King/Behavioral:       Please refer to HPI    Medications: I have reviewed the patient's current medications.  Current Outpatient Medications  Medication Sig Dispense Refill  . ibuprofen (ADVIL) 200 MG tablet Take 400-600 mg by mouth every 6 (six) hours as needed (pain).    Marland Kitchen lamoTRIgine (LAMICTAL) 200 MG tablet Take 1 tablet (200 mg total) by mouth at bedtime. 90 tablet 0  . Norgestimate-Ethinyl Estradiol Triphasic (TRI-SPRINTEC) 0.18/0.215/0.25 MG-35 MCG tablet Take 1 tablet by mouth at bedtime.    . sertraline (ZOLOFT) 100 MG tablet Take 2 tablets (200 mg total) by mouth at bedtime. 180 tablet 1  . traZODone (DESYREL) 100 MG tablet Take 1/2-1 tablet po QHS prn  insomnia 90 tablet 1  . ALPRAZolam (XANAX) 0.5 MG tablet Take 0.5 mg by mouth at bedtime as needed for sleep. (Patient not taking: Reported on 04/26/2020)    . MELATONIN PO Take by mouth. (Patient not taking: Reported on 04/26/2020)    . methylphenidate (CONCERTA) 36 MG PO CR tablet Take 1 tablet (36 mg total) by mouth daily. 30 tablet 0  . [START ON  05/24/2020] methylphenidate (CONCERTA) 36 MG PO CR tablet Take 1 tablet (36 mg total) by mouth daily. 30 tablet 0  . [START ON 06/21/2020] methylphenidate (CONCERTA) 36 MG PO CR tablet Take 1 tablet (36 mg total) by mouth daily. 30 tablet 0  . omeprazole (PRILOSEC) 20 MG capsule Take 1 capsule (20 mg total) by mouth daily. (Patient not taking: Reported on 03/29/2020) 30 capsule 0   No current facility-administered medications for this visit.    Medication Side Effects: None  Allergies:  Allergies  Allergen Reactions  . Levaquin [Levofloxacin] Anaphylaxis and Rash  . Cefaclor Hives and Rash    severe rash  . Diphenhydramine Hives  . Hydroxyzine Hives  . Percocet [Oxycodone-Acetaminophen] Hives    12/19/18: patient reports that she tolerates Oxycodone (but not Percocet)  . Sulfonamide Derivatives Hives and Rash    severe rash  . Tramadol Nausea And Vomiting  . Other Itching    *Cantoloupe* makes mouth really itchy   . Sunflower Oil Itching    Mouth really itchy   . Clindamycin/Lincomycin Rash  . Codeine Rash  . Keflex [Cephalexin] Rash  . Morphine And Related Rash  . Zofran [Ondansetron] Rash    Past Medical History:  Diagnosis Date  . Anxiety   . Depression   . Family history of adverse reaction to anesthesia    maternal aunt had difficulty waken up.   . H/O multiple concussions   . Heart murmur    as a child; had a heart defect.   . Pneumonia   . PONV (postoperative nausea and vomiting)   . Trauma 09/2010   cracked liver from being "stomped on by a horse".    Family History  Problem Relation Age of Onset  . Depression Father   . Depression Sister   . Anxiety disorder Sister   . Depression Paternal Uncle   . Autism Cousin   . Autism Cousin     Social History   Socioeconomic History  . Marital status: Single    Spouse name: Not on file  . Number of children: Not on file  . Years of education: Not on file  . Highest education level: Not on file  Occupational  History  . Not on file  Tobacco Use  . Smoking status: Former Research scientist (life sciences)  . Smokeless tobacco: Never Used  Vaping Use  . Vaping Use: Some days  . Last attempt to quit: 11/12/2017  . Substances: THC  Substance and Sexual Activity  . Alcohol use: Yes    Comment: Occasionally   . Drug use: Yes    Types: Marijuana    Comment: occ  . Sexual activity: Not on file  Other Topics Concern  . Not on file  Social History Narrative  . Not on file   Social Determinants of Health   Financial Resource Strain:   . Difficulty of Paying Living Expenses:   Food Insecurity:   . Worried About Charity fundraiser in the Last Year:   . Arboriculturist in the Last Year:   Transportation Needs:   .  Lack of Transportation (Medical):   Marland Kitchen Lack of Transportation (Non-Medical):   Physical Activity:   . Days of Exercise per Week:   . Minutes of Exercise per Session:   Stress:   . Feeling of Stress :   Social Connections:   . Frequency of Communication with Friends and Family:   . Frequency of Social Gatherings with Friends and Family:   . Attends Religious Services:   . Active Member of Clubs or Organizations:   . Attends Banker Meetings:   Marland Kitchen Marital Status:   Intimate Partner Violence:   . Fear of Current or Ex-Partner:   . Emotionally Abused:   Marland Kitchen Physically Abused:   . Sexually Abused:     Past Medical History, Surgical history, Social history, and Family history were reviewed and updated as appropriate.   Please see review of systems for further details on the patient's review from today.   Objective:   Physical Exam:  There were no vitals taken for this visit.  Physical Exam Constitutional:      Appearance: She is ill-appearing.  Neurological:     Mental Status: She is alert and oriented to person, place, and time.     Cranial Nerves: No dysarthria.  King:        Attention and Perception: Attention and perception normal.        Mood and Affect: Mood normal.         Speech: Speech normal.        Behavior: Behavior is cooperative.        Thought Content: Thought content normal. Thought content is not paranoid or delusional. Thought content does not include homicidal or suicidal ideation. Thought content does not include homicidal or suicidal plan.        Cognition and Memory: Cognition and memory normal.        Judgment: Judgment normal.     Comments: Insight intact     Lab Review:     Component Value Date/Time   NA 138 06/19/2019 1224   K 3.7 06/19/2019 1224   CL 101 06/19/2019 1224   CO2 24 06/19/2019 1224   GLUCOSE 133 (H) 06/19/2019 1224   BUN 10 06/19/2019 1224   CREATININE 0.72 06/19/2019 1224   CALCIUM 9.0 06/19/2019 1224   PROT 7.2 06/19/2019 1224   ALBUMIN 3.2 (L) 06/19/2019 1224   AST 11 (L) 06/19/2019 1224   ALT 11 06/19/2019 1224   ALKPHOS 87 06/19/2019 1224   BILITOT 0.4 06/19/2019 1224   GFRNONAA >60 06/19/2019 1224   GFRAA >60 06/19/2019 1224       Component Value Date/Time   WBC 12.6 (H) 06/19/2019 1224   RBC 5.13 (H) 06/19/2019 1224   HGB 11.7 (L) 06/19/2019 1224   HCT 37.7 06/19/2019 1224   PLT 499 (H) 06/19/2019 1224   MCV 73.5 (L) 06/19/2019 1224   MCH 22.8 (L) 06/19/2019 1224   MCHC 31.0 06/19/2019 1224   RDW 16.4 (H) 06/19/2019 1224   LYMPHSABS 2.2 12/09/2018 1136   MONOABS 0.5 12/09/2018 1136   EOSABS 0.1 12/09/2018 1136   BASOSABS 0.0 12/09/2018 1136    No results found for: POCLITH, LITHIUM   No results found for: PHENYTOIN, PHENOBARB, VALPROATE, CBMZ   .res Assessment: Plan:   Discussed tx options with pt to include changing to another stimulant or resuming Concerta 36 mg po qd since she reports that Concerta 36 mg po qd was very effective for ADD s/s. She reports that she  would like to resume Concerta 36 mg po q am for ADHD since insomnia has been completely controlled with Trazodone. She reports that she will contact office if insomnia recurs.  Start Concerta 36 mg po qd for ADD. Continue  Trazodone for insomnia.  Continue Sertraline 200 mg po qd for anxiety and depression.  Continue Lamictal 200 mg po qd for mood s/s.  Pt to f/u in 3 months or sooner if clinically indicated.  Patient advised to contact office with any questions, adverse effects, or acute worsening in signs and symptoms.  Kimblery was seen today for add and follow-up.  Diagnoses and all orders for this visit:  Episodic mood disorder (HCC) -     lamoTRIgine (LAMICTAL) 200 MG tablet; Take 1 tablet (200 mg total) by mouth at bedtime. -     sertraline (ZOLOFT) 100 MG tablet; Take 2 tablets (200 mg total) by mouth at bedtime.  Attention deficit hyperactivity disorder (ADHD), predominantly inattentive type -     methylphenidate (CONCERTA) 36 MG PO CR tablet; Take 1 tablet (36 mg total) by mouth daily. -     methylphenidate (CONCERTA) 36 MG PO CR tablet; Take 1 tablet (36 mg total) by mouth daily. -     methylphenidate (CONCERTA) 36 MG PO CR tablet; Take 1 tablet (36 mg total) by mouth daily.  Insomnia, unspecified type -     traZODone (DESYREL) 100 MG tablet; Take 1/2-1 tablet po QHS prn insomnia     Please see After Visit Summary for patient specific instructions.  No future appointments.  No orders of the defined types were placed in this encounter.     -------------------------------

## 2020-05-10 ENCOUNTER — Other Ambulatory Visit: Payer: Self-pay | Admitting: Obstetrics and Gynecology

## 2020-05-10 DIAGNOSIS — Z9189 Other specified personal risk factors, not elsewhere classified: Secondary | ICD-10-CM

## 2020-05-10 DIAGNOSIS — M5416 Radiculopathy, lumbar region: Secondary | ICD-10-CM | POA: Diagnosis not present

## 2020-05-10 DIAGNOSIS — M545 Low back pain: Secondary | ICD-10-CM | POA: Diagnosis not present

## 2020-06-02 ENCOUNTER — Ambulatory Visit (HOSPITAL_COMMUNITY)
Admission: EM | Admit: 2020-06-02 | Discharge: 2020-06-02 | Disposition: A | Discharge status: Home / Self Care | Payer: BC Managed Care – PPO

## 2020-06-02 ENCOUNTER — Other Ambulatory Visit: Payer: Self-pay

## 2020-06-03 DIAGNOSIS — R3129 Other microscopic hematuria: Secondary | ICD-10-CM | POA: Diagnosis not present

## 2020-06-03 DIAGNOSIS — R109 Unspecified abdominal pain: Secondary | ICD-10-CM | POA: Diagnosis not present

## 2020-06-07 DIAGNOSIS — M461 Sacroiliitis, not elsewhere classified: Secondary | ICD-10-CM | POA: Diagnosis not present

## 2020-06-07 DIAGNOSIS — R030 Elevated blood-pressure reading, without diagnosis of hypertension: Secondary | ICD-10-CM | POA: Diagnosis not present

## 2020-06-07 DIAGNOSIS — Z6841 Body Mass Index (BMI) 40.0 and over, adult: Secondary | ICD-10-CM | POA: Diagnosis not present

## 2020-06-09 DIAGNOSIS — R102 Pelvic and perineal pain: Secondary | ICD-10-CM | POA: Diagnosis not present

## 2020-06-14 DIAGNOSIS — R3129 Other microscopic hematuria: Secondary | ICD-10-CM | POA: Diagnosis not present

## 2020-06-14 DIAGNOSIS — R161 Splenomegaly, not elsewhere classified: Secondary | ICD-10-CM | POA: Diagnosis not present

## 2020-06-14 DIAGNOSIS — N83202 Unspecified ovarian cyst, left side: Secondary | ICD-10-CM | POA: Diagnosis not present

## 2020-06-15 DIAGNOSIS — R161 Splenomegaly, not elsewhere classified: Secondary | ICD-10-CM | POA: Diagnosis not present

## 2020-06-15 DIAGNOSIS — N83202 Unspecified ovarian cyst, left side: Secondary | ICD-10-CM | POA: Diagnosis not present

## 2020-06-15 DIAGNOSIS — R109 Unspecified abdominal pain: Secondary | ICD-10-CM | POA: Diagnosis not present

## 2020-06-21 DIAGNOSIS — R1032 Left lower quadrant pain: Secondary | ICD-10-CM | POA: Diagnosis not present

## 2020-06-21 DIAGNOSIS — R161 Splenomegaly, not elsewhere classified: Secondary | ICD-10-CM | POA: Diagnosis not present

## 2020-07-06 DIAGNOSIS — R1032 Left lower quadrant pain: Secondary | ICD-10-CM | POA: Diagnosis not present

## 2020-07-06 DIAGNOSIS — R102 Pelvic and perineal pain: Secondary | ICD-10-CM | POA: Diagnosis not present

## 2020-07-07 ENCOUNTER — Ambulatory Visit
Admission: RE | Admit: 2020-07-07 | Discharge: 2020-07-07 | Disposition: A | Discharge status: Home / Self Care | Payer: BC Managed Care – PPO | Source: Ambulatory Visit | Attending: Obstetrics and Gynecology | Admitting: Obstetrics and Gynecology

## 2020-07-07 ENCOUNTER — Other Ambulatory Visit: Payer: Self-pay | Admitting: Obstetrics and Gynecology

## 2020-07-07 DIAGNOSIS — D729 Disorder of white blood cells, unspecified: Secondary | ICD-10-CM

## 2020-07-07 DIAGNOSIS — N83202 Unspecified ovarian cyst, left side: Secondary | ICD-10-CM | POA: Diagnosis not present

## 2020-07-07 DIAGNOSIS — R110 Nausea: Secondary | ICD-10-CM | POA: Diagnosis not present

## 2020-07-07 DIAGNOSIS — N838 Other noninflammatory disorders of ovary, fallopian tube and broad ligament: Secondary | ICD-10-CM | POA: Diagnosis not present

## 2020-07-07 DIAGNOSIS — R109 Unspecified abdominal pain: Secondary | ICD-10-CM | POA: Diagnosis not present

## 2020-07-07 MED ORDER — IOPAMIDOL (ISOVUE-300) INJECTION 61%
100.0000 mL | Freq: Once | INTRAVENOUS | Status: AC | PRN
  Administered 2020-07-07: 100 mL via INTRAVENOUS

## 2020-07-08 DIAGNOSIS — R7989 Other specified abnormal findings of blood chemistry: Secondary | ICD-10-CM | POA: Diagnosis not present

## 2020-07-11 DIAGNOSIS — R102 Pelvic and perineal pain: Secondary | ICD-10-CM | POA: Diagnosis not present

## 2020-07-15 ENCOUNTER — Other Ambulatory Visit (HOSPITAL_COMMUNITY): Payer: BC Managed Care – PPO

## 2020-07-20 ENCOUNTER — Telehealth: Payer: Self-pay | Admitting: Psychiatry

## 2020-07-20 NOTE — Telephone Encounter (Signed)
Pt would like a new rx for Alprazolam sent in at [facility] on [address]

## 2020-07-21 NOTE — Telephone Encounter (Signed)
Received alprazolam #20 on 06/18 from different provider

## 2020-07-22 ENCOUNTER — Telehealth: Payer: Self-pay | Admitting: Psychiatry

## 2020-07-22 NOTE — Telephone Encounter (Signed)
Pt calling for refill on Alprazolam. [facility] never received it. Call to [place], [phone]

## 2020-07-22 NOTE — Telephone Encounter (Signed)
That's correct Shanda Bumps did not send. Patient needs to schedule an apt first

## 2020-07-26 DIAGNOSIS — M461 Sacroiliitis, not elsewhere classified: Secondary | ICD-10-CM | POA: Diagnosis not present

## 2020-07-27 DIAGNOSIS — R030 Elevated blood-pressure reading, without diagnosis of hypertension: Secondary | ICD-10-CM | POA: Diagnosis not present

## 2020-07-27 DIAGNOSIS — M461 Sacroiliitis, not elsewhere classified: Secondary | ICD-10-CM | POA: Diagnosis not present

## 2020-07-27 DIAGNOSIS — Z6841 Body Mass Index (BMI) 40.0 and over, adult: Secondary | ICD-10-CM | POA: Diagnosis not present

## 2020-07-28 ENCOUNTER — Encounter (HOSPITAL_BASED_OUTPATIENT_CLINIC_OR_DEPARTMENT_OTHER): Payer: Self-pay | Admitting: Obstetrics and Gynecology

## 2020-07-28 ENCOUNTER — Other Ambulatory Visit: Payer: Self-pay

## 2020-07-28 NOTE — Progress Notes (Signed)
Spoke w/ via phone for pre-op interview---pt Lab needs dos----  none             Lab results------has lab appt 08-01-20 at 1015 for cbc with dif, serum preg, t & s, bmet (gent) COVID test ------08-01-20 at 1115 Arrive at -------530 am  NPO after MN NO Solid Food.  Clear liquids from MN until---430 am then npo Medications to take morning of surgery -----omeprazole Diabetic medication -----n/a Patient Special Instructions -----none Pre-Op special Istructions -----none Patient verbalized understanding of instructions that were given at this phone interview. Patient denies shortness of breath, chest pain, fever, cough at this phone interview.

## 2020-07-28 NOTE — H&P (Signed)
Patient name Dawn King, heaps DICTATION# [id number] CSN# [id number]  Richardean Chimera, MD 07/28/2020 10:04 AM

## 2020-07-30 ENCOUNTER — Other Ambulatory Visit (HOSPITAL_COMMUNITY): Payer: BC Managed Care – PPO

## 2020-08-01 ENCOUNTER — Other Ambulatory Visit (HOSPITAL_COMMUNITY)
Admission: RE | Admit: 2020-08-01 | Discharge: 2020-08-01 | Disposition: A | Discharge status: Home / Self Care | Payer: BC Managed Care – PPO | Source: Ambulatory Visit | Attending: Obstetrics and Gynecology | Admitting: Obstetrics and Gynecology

## 2020-08-01 ENCOUNTER — Other Ambulatory Visit: Payer: Self-pay

## 2020-08-01 ENCOUNTER — Encounter (HOSPITAL_COMMUNITY)
Admission: RE | Admit: 2020-08-01 | Discharge: 2020-08-01 | Disposition: A | Discharge status: Home / Self Care | Payer: BC Managed Care – PPO | Source: Ambulatory Visit | Attending: Obstetrics and Gynecology | Admitting: Obstetrics and Gynecology

## 2020-08-01 DIAGNOSIS — Z20822 Contact with and (suspected) exposure to covid-19: Secondary | ICD-10-CM | POA: Diagnosis not present

## 2020-08-01 DIAGNOSIS — Z01812 Encounter for preprocedural laboratory examination: Secondary | ICD-10-CM | POA: Insufficient documentation

## 2020-08-01 LAB — CBC WITH DIFFERENTIAL/PLATELET
Abs Immature Granulocytes: 0.06 10*3/uL (ref 0.00–0.07)
Basophils Absolute: 0 10*3/uL (ref 0.0–0.1)
Basophils Relative: 0 %
Eosinophils Absolute: 0.1 10*3/uL (ref 0.0–0.5)
Eosinophils Relative: 1 %
HCT: 38.4 % (ref 36.0–46.0)
Hemoglobin: 11.5 g/dL — ABNORMAL LOW (ref 12.0–15.0)
Immature Granulocytes: 1 %
Lymphocytes Relative: 27 %
Lymphs Abs: 3.5 10*3/uL (ref 0.7–4.0)
MCH: 21.5 pg — ABNORMAL LOW (ref 26.0–34.0)
MCHC: 29.9 g/dL — ABNORMAL LOW (ref 30.0–36.0)
MCV: 71.9 fL — ABNORMAL LOW (ref 80.0–100.0)
Monocytes Absolute: 0.7 10*3/uL (ref 0.1–1.0)
Monocytes Relative: 6 %
Neutro Abs: 8.5 10*3/uL — ABNORMAL HIGH (ref 1.7–7.7)
Neutrophils Relative %: 65 %
Platelets: 490 10*3/uL — ABNORMAL HIGH (ref 150–400)
RBC: 5.34 MIL/uL — ABNORMAL HIGH (ref 3.87–5.11)
RDW: 17.2 % — ABNORMAL HIGH (ref 11.5–15.5)
WBC: 12.9 10*3/uL — ABNORMAL HIGH (ref 4.0–10.5)
nRBC: 0 % (ref 0.0–0.2)

## 2020-08-01 LAB — BASIC METABOLIC PANEL
Anion gap: 11 (ref 5–15)
BUN: 12 mg/dL (ref 6–20)
CO2: 26 mmol/L (ref 22–32)
Calcium: 8.9 mg/dL (ref 8.9–10.3)
Chloride: 101 mmol/L (ref 98–111)
Creatinine, Ser: 0.6 mg/dL (ref 0.44–1.00)
GFR calc Af Amer: >60 mL/min (ref >60)
GFR calc non Af Amer: >60 mL/min (ref >60)
Glucose, Bld: 94 mg/dL (ref 70–99)
Potassium: 4.2 mmol/L (ref 3.5–5.1)
Sodium: 138 mmol/L (ref 135–145)

## 2020-08-01 LAB — HCG, SERUM, QUALITATIVE: Preg, Serum: NEGATIVE

## 2020-08-01 LAB — SARS CORONAVIRUS 2 (TAT 6-24 HRS): SARS Coronavirus 2: NEGATIVE

## 2020-08-01 NOTE — H&P (Signed)
NAME: Dawn King, Dawn King [facility] MEDICAL RECORD [id number] ACCOUNT [id number] DATE OF BIRTH:1991-04-16 FACILITY: [facility] LOCATION: [facility] PHYSICIAN:Didi Ganaway Lisbeth Ply, MD  HISTORY AND PHYSICAL  DATE OF ADMISSION:  08/03/2020  HISTORY OF PRESENT ILLNESS:  The patient is a 29 year old nulligravida female who presents for diagnostic laparoscopy.  She has been having persistent pelvic pain.  She has had previous ultrasounds and CT scans.  The ultrasound evaluation has revealed a  persistent left ovarian cyst.  It measures 3.5 x 2.5 and the second one measuring 2.8 x 1.5 cm.  This could be a hemorrhagic cyst or possible endometriosis.  She had a CT scan that was normal.  Because of persistent pain and discomfort, unresponsive to  conservative management, the patient now presents for diagnostic laparoscopy.  ALLERGIES:  SHE IS ALLERGIC TO BENADRYL, CECLOR,  CEPHALEXIN, CLINDAMYCIN, HYDROXYZINE, LEVAQUIN, SEPTRA, SULFA, AND ZOFRAN.  MEDICATIONS:  At the present time, she is on alprazolam 0.5 mg, Estraylla, hydrocodone, amitriptyline 200 mg daily, Zoloft 100 mg daily, trazodone as needed for sleep.  PAST MEDICAL HISTORY:  She has had usual childhood diseases without any significant sequelae.  PAST SURGICAL HISTORY:  She has had removal of tonsils.  She has had breast surgery and back surgery.  SOCIAL HISTORY:  Reveals no tobacco or alcohol use.  FAMILY HISTORY:  Noncontributory.  REVIEW OF SYSTEMS:  Noncontributory.  PHYSICAL EXAMINATION: VITAL SIGNS:  The patient is afebrile with stable vital signs. HEENT:  The patient is normocephalic.  Pupils equal, round, react to light and accommodation.  Extraocular movements were intact.  Sclerae and conjunctivae clear.  Oropharynx clear. NECK:  Without thyromegaly. BREASTS:  No discrete masses. LUNGS:  Clear. CARDIOVASCULAR:  Regular rhythm and rate without murmurs or gallops.  No carotid or abdominal bruits. ABDOMEN:  Benign.  She does have some mild  lower pelvic tenderness. PELVIC:  Normal external genitalia.  Vaginal mucosa is clear.  Cervix is unremarkable.  Uterus normal size, shape and contour.  Adnexa are free of masses.  Does have some pelvic tenderness.  IMPRESSION:  Lower pelvic discomfort with finding of a left ovarian cyst.  PLAN:  The patient to undergo diagnostic laparoscopy, possible removal of cyst.  The nature of  surgery has been discussed.  The risks have been explained including the risk of infection, risk of hemorrhage that could require transfusion with the risk of  AIDS or hepatitis.  Risk of injury to adjacent organs including bladder, bowel or ureters that could require further exploratory surgery.  Risk of venous thrombosis and pulmonary embolus.  The patient expressed understanding of indications and risks.  VN/NUANCE  D:07/28/2020 T:07/28/2020 JOB:[id number]

## 2020-08-02 ENCOUNTER — Telehealth: Payer: Self-pay | Admitting: Psychiatry

## 2020-08-02 NOTE — Telephone Encounter (Signed)
Error

## 2020-08-02 NOTE — Anesthesia Preprocedure Evaluation (Addendum)
Anesthesia Evaluation  Patient identified by MRN, date of birth, ID band Patient awake    Reviewed: Allergy & Precautions, NPO status , Patient's Chart, lab work & pertinent test results  History of Anesthesia Complications (+) PONV and history of anesthetic complications  Airway Mallampati: II       Dental  (+) Poor Dentition,    Pulmonary former smoker,    Pulmonary exam normal        Cardiovascular negative cardio ROS Normal cardiovascular exam     Neuro/Psych PSYCHIATRIC DISORDERS Anxiety Depression    GI/Hepatic Neg liver ROS, GERD  Medicated,  Endo/Other  Morbid obesity  Renal/GU negative Renal ROS     Musculoskeletal   Abdominal (+) + obese,   Peds  Hematology negative hematology ROS (+)   Anesthesia Other Findings   Reproductive/Obstetrics                            Anesthesia Physical  Anesthesia Plan  ASA: III  Anesthesia Plan: General   Post-op Pain Management:    Induction: Intravenous  PONV Risk Score and Plan: 4 or greater and Ondansetron, Dexamethasone, Midazolam and Scopolamine patch - Pre-op  Airway Management Planned: Oral ETT  Additional Equipment: None  Intra-op Plan:   Post-operative Plan: Extubation in OR  Informed Consent: I have reviewed the patients History and Physical, chart, labs and discussed the procedure including the risks, benefits and alternatives for the proposed anesthesia with the patient or authorized representative who has indicated his/her understanding and acceptance.     Dental advisory given  Plan Discussed with:   Anesthesia Plan Comments:        Anesthesia Quick Evaluation

## 2020-08-03 ENCOUNTER — Ambulatory Visit (HOSPITAL_BASED_OUTPATIENT_CLINIC_OR_DEPARTMENT_OTHER): Payer: BC Managed Care – PPO | Admitting: Anesthesiology

## 2020-08-03 ENCOUNTER — Other Ambulatory Visit: Payer: Self-pay

## 2020-08-03 ENCOUNTER — Ambulatory Visit (HOSPITAL_BASED_OUTPATIENT_CLINIC_OR_DEPARTMENT_OTHER): Payer: BC Managed Care – PPO | Admitting: Physician Assistant

## 2020-08-03 ENCOUNTER — Encounter (HOSPITAL_BASED_OUTPATIENT_CLINIC_OR_DEPARTMENT_OTHER)
Admission: RE | Disposition: A | Discharge status: Home / Self Care | Payer: Self-pay | Source: Home / Self Care | Attending: Obstetrics and Gynecology

## 2020-08-03 ENCOUNTER — Ambulatory Visit (HOSPITAL_BASED_OUTPATIENT_CLINIC_OR_DEPARTMENT_OTHER)
Admission: RE | Admit: 2020-08-03 | Discharge: 2020-08-03 | Disposition: A | Discharge status: Home / Self Care | Payer: BC Managed Care – PPO | Attending: Obstetrics and Gynecology | Admitting: Obstetrics and Gynecology

## 2020-08-03 ENCOUNTER — Encounter (HOSPITAL_BASED_OUTPATIENT_CLINIC_OR_DEPARTMENT_OTHER): Payer: Self-pay | Admitting: Obstetrics and Gynecology

## 2020-08-03 DIAGNOSIS — R102 Pelvic and perineal pain: Secondary | ICD-10-CM | POA: Insufficient documentation

## 2020-08-03 DIAGNOSIS — N803 Endometriosis of pelvic peritoneum: Secondary | ICD-10-CM | POA: Insufficient documentation

## 2020-08-03 DIAGNOSIS — Z881 Allergy status to other antibiotic agents status: Secondary | ICD-10-CM | POA: Diagnosis not present

## 2020-08-03 DIAGNOSIS — Z888 Allergy status to other drugs, medicaments and biological substances status: Secondary | ICD-10-CM | POA: Diagnosis not present

## 2020-08-03 DIAGNOSIS — Z882 Allergy status to sulfonamides status: Secondary | ICD-10-CM | POA: Diagnosis not present

## 2020-08-03 DIAGNOSIS — F418 Other specified anxiety disorders: Secondary | ICD-10-CM | POA: Diagnosis not present

## 2020-08-03 DIAGNOSIS — K219 Gastro-esophageal reflux disease without esophagitis: Secondary | ICD-10-CM | POA: Diagnosis not present

## 2020-08-03 DIAGNOSIS — D72829 Elevated white blood cell count, unspecified: Secondary | ICD-10-CM | POA: Diagnosis not present

## 2020-08-03 DIAGNOSIS — N83202 Unspecified ovarian cyst, left side: Secondary | ICD-10-CM | POA: Insufficient documentation

## 2020-08-03 HISTORY — DX: Gastro-esophageal reflux disease without esophagitis: K21.9

## 2020-08-03 HISTORY — DX: Hypothyroidism, unspecified: E03.9

## 2020-08-03 HISTORY — PX: LAPAROSCOPY: SHX197

## 2020-08-03 HISTORY — DX: Pelvic and perineal pain: R10.2

## 2020-08-03 LAB — TYPE AND SCREEN
ABO/RH(D): O NEG
Antibody Screen: NEGATIVE

## 2020-08-03 LAB — POCT PREGNANCY, URINE: Preg Test, Ur: NEGATIVE

## 2020-08-03 SURGERY — LAPAROSCOPY, DIAGNOSTIC
Anesthesia: General

## 2020-08-03 MED ORDER — OXYCODONE HCL 5 MG PO TABS: 5.0000 mg | ORAL_TABLET | Freq: Once | ORAL | Status: DC | PRN

## 2020-08-03 MED ORDER — GENTAMICIN SULFATE 40 MG/ML IJ SOLN
400.0000 mg | INTRAVENOUS | Status: AC
  Administered 2020-08-03: 400 mg via INTRAVENOUS
  Filled 2020-08-03: qty 10

## 2020-08-03 MED ORDER — PROPOFOL 10 MG/ML IV BOLUS
INTRAVENOUS | Status: AC
  Filled 2020-08-03: qty 60

## 2020-08-03 MED ORDER — LACTATED RINGERS IV SOLN: INTRAVENOUS | Status: DC

## 2020-08-03 MED ORDER — GLYCOPYRROLATE 0.2 MG/ML IJ SOLN
INTRAMUSCULAR | Status: DC | PRN
  Administered 2020-08-03: .2 mg via INTRAVENOUS

## 2020-08-03 MED ORDER — LIDOCAINE 2% (20 MG/ML) 5 ML SYRINGE
INTRAMUSCULAR | Status: AC
  Filled 2020-08-03: qty 5

## 2020-08-03 MED ORDER — METRONIDAZOLE IN NACL 5-0.79 MG/ML-% IV SOLN
INTRAVENOUS | Status: AC
  Filled 2020-08-03: qty 100

## 2020-08-03 MED ORDER — MIDAZOLAM HCL 2 MG/2ML IJ SOLN
INTRAMUSCULAR | Status: DC | PRN
  Administered 2020-08-03: 1 mg via INTRAVENOUS

## 2020-08-03 MED ORDER — PROPOFOL 10 MG/ML IV BOLUS
INTRAVENOUS | Status: DC | PRN
  Administered 2020-08-03: 200 mg via INTRAVENOUS

## 2020-08-03 MED ORDER — PROMETHAZINE HCL 25 MG/ML IJ SOLN
6.2500 mg | INTRAMUSCULAR | Status: DC | PRN
  Administered 2020-08-03: 12.5 mg via INTRAVENOUS

## 2020-08-03 MED ORDER — MIDAZOLAM HCL 2 MG/2ML IJ SOLN
INTRAMUSCULAR | Status: AC
  Filled 2020-08-03: qty 2

## 2020-08-03 MED ORDER — FENTANYL CITRATE (PF) 250 MCG/5ML IJ SOLN
INTRAMUSCULAR | Status: AC
  Filled 2020-08-03: qty 5

## 2020-08-03 MED ORDER — ROCURONIUM BROMIDE 10 MG/ML (PF) SYRINGE
PREFILLED_SYRINGE | INTRAVENOUS | Status: AC
  Filled 2020-08-03: qty 10

## 2020-08-03 MED ORDER — HYDROCODONE-IBUPROFEN 5-200 MG PO TABS: 1.0000 | ORAL_TABLET | Freq: Three times a day (TID) | ORAL | 0 refills | Status: AC | PRN

## 2020-08-03 MED ORDER — BUPIVACAINE HCL 0.25 % IJ SOLN
INTRAMUSCULAR | Status: DC | PRN
  Administered 2020-08-03: 8 mL

## 2020-08-03 MED ORDER — FENTANYL CITRATE (PF) 100 MCG/2ML IJ SOLN
INTRAMUSCULAR | Status: DC | PRN
Start: 2020-08-03 — End: 2020-08-03
  Administered 2020-08-03 (×3): 50 ug via INTRAVENOUS
  Administered 2020-08-03: 100 ug via INTRAVENOUS

## 2020-08-03 MED ORDER — SUGAMMADEX SODIUM 500 MG/5ML IV SOLN
INTRAVENOUS | Status: DC | PRN
  Administered 2020-08-03: 477.2 mg via INTRAVENOUS

## 2020-08-03 MED ORDER — SUGAMMADEX SODIUM 500 MG/5ML IV SOLN
INTRAVENOUS | Status: AC
  Filled 2020-08-03: qty 5

## 2020-08-03 MED ORDER — POVIDONE-IODINE 10 % EX SWAB: 2.0000 "application " | Freq: Once | CUTANEOUS | Status: DC

## 2020-08-03 MED ORDER — DEXAMETHASONE SODIUM PHOSPHATE 4 MG/ML IJ SOLN
INTRAMUSCULAR | Status: DC | PRN
  Administered 2020-08-03: 10 mg via INTRAVENOUS

## 2020-08-03 MED ORDER — KETOROLAC TROMETHAMINE 30 MG/ML IJ SOLN
30.0000 mg | Freq: Once | INTRAMUSCULAR | Status: AC | PRN
  Administered 2020-08-03: 30 mg via INTRAVENOUS

## 2020-08-03 MED ORDER — METRONIDAZOLE IN NACL 5-0.79 MG/ML-% IV SOLN
500.0000 mg | INTRAVENOUS | Status: AC
  Administered 2020-08-03: 500 mg via INTRAVENOUS

## 2020-08-03 MED ORDER — FENTANYL CITRATE (PF) 100 MCG/2ML IJ SOLN
INTRAMUSCULAR | Status: AC
  Filled 2020-08-03: qty 2

## 2020-08-03 MED ORDER — LIDOCAINE HCL (CARDIAC) PF 100 MG/5ML IV SOSY
PREFILLED_SYRINGE | INTRAVENOUS | Status: DC | PRN
  Administered 2020-08-03: 100 mg via INTRAVENOUS

## 2020-08-03 MED ORDER — GLYCOPYRROLATE PF 0.2 MG/ML IJ SOSY
PREFILLED_SYRINGE | INTRAMUSCULAR | Status: AC
  Filled 2020-08-03: qty 1

## 2020-08-03 MED ORDER — SODIUM CHLORIDE 0.9 % IR SOLN
Status: DC | PRN
  Administered 2020-08-03: 500 mL

## 2020-08-03 MED ORDER — PROMETHAZINE HCL 25 MG/ML IJ SOLN
INTRAMUSCULAR | Status: AC
  Filled 2020-08-03: qty 1

## 2020-08-03 MED ORDER — ROCURONIUM BROMIDE 100 MG/10ML IV SOLN
INTRAVENOUS | Status: DC | PRN
  Administered 2020-08-03: 80 mg via INTRAVENOUS

## 2020-08-03 MED ORDER — FENTANYL CITRATE (PF) 100 MCG/2ML IJ SOLN
25.0000 ug | INTRAMUSCULAR | Status: DC | PRN
  Administered 2020-08-03 (×2): 50 ug via INTRAVENOUS

## 2020-08-03 MED ORDER — ONDANSETRON HCL 4 MG/2ML IJ SOLN
INTRAMUSCULAR | Status: AC
  Filled 2020-08-03: qty 2

## 2020-08-03 MED ORDER — OXYCODONE HCL 5 MG/5ML PO SOLN: 5.0000 mg | Freq: Once | ORAL | Status: DC | PRN

## 2020-08-03 MED ORDER — MEPERIDINE HCL 25 MG/ML IJ SOLN: 6.2500 mg | INTRAMUSCULAR | Status: DC | PRN

## 2020-08-03 MED ORDER — ACETAMINOPHEN 160 MG/5ML PO SOLN: 325.0000 mg | ORAL | Status: DC | PRN

## 2020-08-03 MED ORDER — ACETAMINOPHEN 325 MG PO TABS: 325.0000 mg | ORAL_TABLET | ORAL | Status: DC | PRN

## 2020-08-03 MED ORDER — KETOROLAC TROMETHAMINE 30 MG/ML IJ SOLN
INTRAMUSCULAR | Status: AC
  Filled 2020-08-03: qty 1

## 2020-08-03 MED ORDER — DEXAMETHASONE SODIUM PHOSPHATE 10 MG/ML IJ SOLN
INTRAMUSCULAR | Status: AC
  Filled 2020-08-03: qty 1

## 2020-08-03 SURGICAL SUPPLY — 42 items
ADH SKN CLS APL DERMABOND .7 (GAUZE/BANDAGES/DRESSINGS) ×1
APL SWBSTK 6 STRL LF DISP (MISCELLANEOUS)
APPLICATOR COTTON TIP 6 STRL (MISCELLANEOUS) IMPLANT
APPLICATOR COTTON TIP 6IN STRL (MISCELLANEOUS) IMPLANT
BAG RETRIEVAL 10 (BASKET)
CANISTER SUCT 1200ML W/VALVE (MISCELLANEOUS) IMPLANT
CANISTER SUCT 3000ML PPV (MISCELLANEOUS) IMPLANT
CATH ROBINSON RED A/P 16FR (CATHETERS) ×2 IMPLANT
COVER MAYO STAND STRL (DRAPES) ×2 IMPLANT
COVER WAND RF STERILE (DRAPES) ×2 IMPLANT
DERMABOND ADVANCED (GAUZE/BANDAGES/DRESSINGS) ×1
DERMABOND ADVANCED .7 DNX12 (GAUZE/BANDAGES/DRESSINGS) ×1 IMPLANT
DRSG COVADERM PLUS 2X2 (GAUZE/BANDAGES/DRESSINGS) IMPLANT
DRSG OPSITE POSTOP 3X4 (GAUZE/BANDAGES/DRESSINGS) ×1 IMPLANT
DURAPREP 26ML APPLICATOR (WOUND CARE) ×2 IMPLANT
GAUZE 4X4 16PLY RFD (DISPOSABLE) ×2 IMPLANT
GLOVE BIO SURGEON STRL SZ7 (GLOVE) ×4 IMPLANT
GOWN STRL REUS W/TWL XL LVL3 (GOWN DISPOSABLE) ×2 IMPLANT
KIT TURNOVER CYSTO (KITS) ×2 IMPLANT
NEEDLE INSUFFLATION 120MM (ENDOMECHANICALS) IMPLANT
NS IRRIG 500ML POUR BTL (IV SOLUTION) ×2 IMPLANT
PACK LAPAROSCOPY BASIN (CUSTOM PROCEDURE TRAY) ×2 IMPLANT
PAD OB MATERNITY 4.3X12.25 (PERSONAL CARE ITEMS) ×2 IMPLANT
PAD PREP 24X48 CUFFED NSTRL (MISCELLANEOUS) ×2 IMPLANT
SCISSORS LAP 5X45 EPIX DISP (ENDOMECHANICALS) IMPLANT
SEALER TISSUE G2 CVD JAW 45CM (ENDOMECHANICALS) IMPLANT
SET IRRIG Y TYPE TUR BLADDER L (SET/KITS/TRAYS/PACK) IMPLANT
SET SUCTION IRRIG HYDROSURG (IRRIGATION / IRRIGATOR) IMPLANT
SET TUBE SMOKE EVAC HIGH FLOW (TUBING) ×2 IMPLANT
SUT VIC AB 3-0 PS2 18 (SUTURE) ×2
SUT VIC AB 3-0 PS2 18XBRD (SUTURE) ×1 IMPLANT
SUT VIC AB 4-0 PS2 18 (SUTURE) IMPLANT
SUT VICRYL 0 ENDOLOOP (SUTURE) IMPLANT
SUT VICRYL 0 UR6 27IN ABS (SUTURE) IMPLANT
SYS BAG RETRIEVAL 10MM (BASKET)
SYSTEM BAG RETRIEVAL 10MM (BASKET) IMPLANT
TOWEL OR 17X26 10 PK STRL BLUE (TOWEL DISPOSABLE) ×4 IMPLANT
TROCAR BALLN 12MMX100 BLUNT (TROCAR) IMPLANT
TROCAR OPTI TIP 5M 100M (ENDOMECHANICALS) ×2 IMPLANT
TROCAR XCEL DIL TIP R 11M (ENDOMECHANICALS) ×1 IMPLANT
WARMER LAPAROSCOPE (MISCELLANEOUS) ×2 IMPLANT
WATER STERILE IRR 500ML POUR (IV SOLUTION) ×2 IMPLANT

## 2020-08-03 NOTE — Anesthesia Procedure Notes (Signed)
Procedure Name: Intubation Date/Time: 08/03/2020 7:28 AM Performed by: Georgeanne Nim, CRNA Pre-anesthesia Checklist: Patient identified, Emergency Drugs available, Suction available, Patient being monitored and Timeout performed Patient Re-evaluated:Patient Re-evaluated prior to induction Oxygen Delivery Method: Circle system utilized Preoxygenation: Pre-oxygenation with 100% oxygen Induction Type: IV induction Ventilation: Mask ventilation without difficulty and Oral airway inserted - appropriate to patient size Laryngoscope Size: Mac and 4 Grade View: Grade I Tube type: Oral Tube size: 7.0 mm Number of attempts: 1 Airway Equipment and Method: Stylet Placement Confirmation: ETT inserted through vocal cords under direct vision,  positive ETCO2,  CO2 detector and breath sounds checked- equal and bilateral Secured at: 20 cm Tube secured with: Tape Dental Injury: Teeth and Oropharynx as per pre-operative assessment

## 2020-08-03 NOTE — Transfer of Care (Signed)
Immediate Anesthesia Transfer of Care Note  Patient: Dawn King [facility]  Procedure(s) Performed: LAPAROSCOPY DIAGNOSTIC, ABLATION OF ENDOMETRIAL IMPLANTS  Patient Location: PACU  Anesthesia Type:General  Level of Consciousness: awake, alert , oriented and patient cooperative  Airway & Oxygen Therapy: Patient Spontanous Breathing and Patient connected to nasal cannula oxygen  Post-op Assessment: Report given to RN and Post -op Vital signs reviewed and stable  Post vital signs: Reviewed and stable  Last Vitals:  Vitals Value Taken Time  BP 142/69 08/03/20 0819  Temp 36.8 C 08/03/20 0819  Pulse 86 08/03/20 0819  Resp 11 08/03/20 0819  SpO2 88 % 08/03/20 0819  Vitals shown include unvalidated device data.  Last Pain:  Vitals:   08/03/20 0623  TempSrc: Oral  PainSc: 8       Patients Stated Pain Goal: 4 (08/03/20 9485)  Complications: No complications documented.

## 2020-08-03 NOTE — H&P (Signed)
  History and physical exam unchanged 

## 2020-08-03 NOTE — Brief Op Note (Signed)
08/03/2020  8:16 AM  PATIENT:  Dawn King  29 y.o. female  PRE-OPERATIVE DIAGNOSIS:  Pelvic Pain  POST-OPERATIVE DIAGNOSIS:  Pelvic Pain  PROCEDURE:  Procedure(s): LAPAROSCOPY DIAGNOSTIC, ABLATION OF ENDOMETRIAL IMPLANTS  SURGEON:  Surgeon(s) and Role:    * Richardean Chimera, MD - Primary  PHYSICIAN ASSISTANT:   ASSISTANTS: none   ANESTHESIA:   general  EPP:IRJJOAC  BLOOD ADMINISTERED:none  DRAINS: none   LOCAL MEDICATIONS USED:  MARCAINE     SPECIMEN:  No Specimen  DISPOSITION OF SPECIMEN:  N/A  COUNTS:  YES  TOURNIQUET:  * No tourniquets in log *  DICTATION: .Other Dictation: Dictation Number [id number]  PLAN OF CARE: Discharge to home after PACU  PATIENT DISPOSITION:  PACU - hemodynamically stable.   Delay start of Pharmacological VTE agent (>24hrs) due to surgical blood loss or risk of bleeding: no

## 2020-08-03 NOTE — Op Note (Signed)
NAME: Dawn King, Dawn King [facility] MEDICAL RECORD [id number] ACCOUNT [id number] DATE OF BIRTH:1991-03-20 FACILITY: [facility] LOCATION: [facility] PHYSICIAN:Ernan Runkles Lisbeth Ply, MD  OPERATIVE REPORT  DATE OF PROCEDURE:  08/03/2020  PREOPERATIVE DIAGNOSIS:  Pelvic pain.  POSTOPERATIVE DIAGNOSIS:  Pelvic endometriosis.  OPERATIVE PROCEDURE:  Diagnostic laparoscopy with cautery of endometriotic implants.  SURGEON:  Juluis Mire, MD  ANESTHESIA:  General endotracheal.  ESTIMATED BLOOD LOSS:  Minimal.  PACKS AND DRAINS:  None.  INTRAOPERATIVE BLOOD PLACED:  None.  COMPLICATIONS:  None.  INDICATIONS:  Dictated in history and physical.  DESCRIPTION OF PROCEDURE:  The patient was taken to the OR and placed in the supine position.  After satisfactory level of general endotracheal anesthesia was obtained, the patient was placed in the dorsal lithotomy position using the Allen stirrups.   The abdomen was prepped out with DuraPrep.  The perineum was prepped out with Betadine.  Bladder was emptied with catheterization.  A Hulka tenaculum was put in place and secured.  The patient was draped as sterile field.  Subumbilical incision made with  a knife.  Veress needle was introduced in abdominal cavity.  Abdomen inflated with approximately 4 liters of carbon dioxide.  Operative trocar was then put in place.  Laparoscope was introduced.  There was no evidence of injury to adjacent organs.  A 5  mm trocar was put in place in the suprapubic area under direct visualization.  At this point in time, uterus was elevated.  Uterus was unremarkable.  Both tubes and ovaries were clear.  She had simple cyst involving the left ovary.  In the cul-de-sac,  she had endometriosis around both uterosacral ligaments.  We brought in the bipolar.  These areas were cauterized.  No adhesions or other evidence of endometriosis were noted.  The upper abdomen including liver tip of the gallbladder, both lateral  gutters including  appendix were normal.  Again, no evidence of injury to adjacent organs.  At this point in time, all areas of endometriosis had been cauterized.  The laparoscope was then removed.  Abdomen was deflated of carbon dioxide.  All trocars  were removed.  Subumbilical incision closed with interrupted subcuticulars of 4-0 Vicryl.  Suprapubic incision closed with Dermabond.  The Hulka tenaculum was then removed.  The patient was taken out of dorsal lithotomy position once alert and extubated  and transferred to recovery room in good condition.  Sponge, instrument and needle count were reported as correct by circulating nurse x2.  CN/NUANCE  D:08/03/2020 T:08/03/2020 JOB:[id number]

## 2020-08-03 NOTE — Anesthesia Postprocedure Evaluation (Signed)
Anesthesia Post Note  Patient: Dawn King [facility]  Procedure(s) Performed: LAPAROSCOPY DIAGNOSTIC, ABLATION OF ENDOMETRIAL IMPLANTS     Patient location during evaluation: Phase II Anesthesia Type: General Level of consciousness: awake Pain management: pain level controlled Vital Signs Assessment: post-procedure vital signs reviewed and stable Respiratory status: spontaneous breathing Cardiovascular status: stable Postop Assessment: no apparent nausea or vomiting Anesthetic complications: no   No complications documented.  Last Vitals:  Vitals:   08/03/20 0915 08/03/20 0920  BP: 119/77   Pulse: 84   Resp: 14   Temp:    SpO2: (!) 89% 93%    Last Pain:  Vitals:   08/03/20 0915  TempSrc:   PainSc: 5    Pain Goal: Patients Stated Pain Goal: 5 (08/03/20 0819)                 Caren Macadam

## 2020-08-03 NOTE — Addendum Note (Signed)
Addendum  created 08/03/20 1028 by Earmon Phoenix, CRNA   Flowsheet accepted, Intraprocedure Event edited, Intraprocedure Flowsheets edited

## 2020-08-03 NOTE — Discharge Instructions (Signed)
Diagnostic Laparoscopy, Care After °This sheet gives you information about how to care for yourself after your procedure. Your health care provider may also give you more specific instructions. If you have problems or questions, contact your health care provider. °What can I expect after the procedure? °After the procedure, it is common to have: °· Mild discomfort in the abdomen. °· Sore throat. °Women who have laparoscopy with pelvic examination may have mild cramping and fluid coming from the vagina for a few days after the procedure. °Follow these instructions at home: °Medicines °· Take over-the-counter and prescription medicines only as told by your health care provider. °· If you were prescribed an antibiotic medicine, take it as told by your health care provider. Do not stop taking the antibiotic even if you start to feel better. °Driving °· Do not drive for 24 hours if you were given a medicine to help you relax (sedative) during your procedure. °· Do not drive or use heavy machinery while taking prescription pain medicine. °Bathing °· Do not take baths, swim, or use a hot tub until your health care provider approves. You may take showers. °Incision care ° °· Follow instructions from your health care provider about how to take care of your incisions. Make sure you: °? Wash your hands with soap and water before you change your bandage (dressing). If soap and water are not available, use hand sanitizer. °? Change your dressing as told by your health care provider. °? Leave stitches (sutures), skin glue, or adhesive strips in place. These skin closures may need to stay in place for 2 weeks or longer. If adhesive strip edges start to loosen and curl up, you may trim the loose edges. Do not remove adhesive strips completely unless your health care provider tells you to do that. °· Check your incision areas every day for signs of infection. Check for: °? Redness, swelling, or pain. °? Fluid or  blood. °? Warmth. °? Pus or a bad smell. °Activity °· Return to your normal activities as told by your health care provider. Ask your health care provider what activities are safe for you. °· Do not lift anything that is heavier than 10 lb (4.5 kg), or the limit that you are told, until your health care provider says that it is safe. °General instructions °· To prevent or treat constipation while you are taking prescription pain medicine, your health care provider may recommend that you: °? Drink enough fluid to keep your urine pale yellow. °? Take over-the-counter or prescription medicines. °? Eat foods that are high in fiber, such as fresh fruits and vegetables, whole grains, and beans. °? Limit foods that are high in fat and processed sugars, such as fried and sweet foods. °· Do not use any products that contain nicotine or tobacco, such as cigarettes and e-cigarettes. If you need help quitting, ask your health care provider. °· Keep all follow-up visits as told by your health care provider. This is important. °Contact a health care provider if: °· You develop shoulder pain. °· You feel lightheaded or faint. °· You are unable to pass gas or have a bowel movement. °· You feel nauseous or you vomit. °· You develop a rash. °· You have redness, swelling, or pain around any incision. °· You have fluid or blood coming from any incision. °· Any incision feels warm to the touch. °· You have pus or a bad smell coming from any incision. °· You have a fever or chills. °Get help   right away if:  You have severe pain.  You have vomiting that does not go away.  You have heavy bleeding from the vagina.  Any incision opens.  You have trouble breathing.  You have chest pain. Summary  After the procedure, it is common to have mild discomfort in the abdomen and a sore throat.  Check your incision areas every day for signs of infection.  Return to your normal activities as told by your health care provider. Ask  your health care provider what activities are safe for you. This information is not intended to replace advice given to you by your health care provider. Make sure you discuss any questions you have with your health care provider. Document Revised: 10/11/2017 Document Reviewed: 04/24/2017 Elsevier Patient Education  2020 [employer]  Endometrial Ablation, Care After This sheet gives you information about how to care for yourself after your procedure. Your health care provider may also give you more specific instructions. If you have problems or questions, contact your health care provider. What can I expect after the procedure? After the procedure, it is common to have:  A need to urinate more frequently than usual for the first 24 hours.  Cramps similar to menstrual cramps. These may last for 1-2 days.  Thin, watery vaginal discharge that is light pink or brown in color. This may last a few weeks. Discharge will be heavy for the first few days after your procedure. You may need to wear a sanitary pad.  Nausea.  Vaginal bleeding for 4-6 weeks after the procedure, as tissue healing occurs. Follow these instructions at home: Activity  Do not drive for 24 hours if you were given amedicine to help you relax (sedative) during your procedure.  Do not have sex or put anything into your vagina until your health care provider approves.  Do not lift anything that is heavier than 10 lb (4.5 kg), or the limit that you are told, until your health care provider says that it is safe.  Return to your normal activities as told by your health care provider. Ask your health care provider what activities are safe for you. General instructions   Take over-the-counter and prescription medicines only as told by your health care provider.  Do not take baths, swim, or use a hot tub until your health care provider approves. You will be able to take showers.  Check your vaginal area every day for signs  of infection. Check for: ? Redness, swelling, or pain. ? More discharge or blood, instead of less. ? Bad-smelling discharge.  Keep all follow-up visits as told by your health care provider. This is important.  Drink enough fluid to keep your urine pale yellow. Contact a health care provider if you have:  Vaginal redness, swelling, or pain.  Vaginal discharge or bleeding that gets worse instead of getting better.  Bad-smelling vaginal discharge.  A fever or chills.  Trouble urinating. Get help right away if you have:  Heavy vaginal bleeding.  Severe cramps. Summary  After endometrial ablation, it is normal to have thin, watery vaginal discharge that is light pink or brown in color. This may last a few weeks and may be heavier right after the procedure.  Vaginal bleeding is also normal after the procedure and should get better with time.  Check your vaginal area every day for signs of infection, such as bad-smelling discharge.  Keep all follow-up visits as told by your health care provider. This is important. This information  is not intended to replace advice given to you by your health care provider. Make sure you discuss any questions you have with your health care provider. Document Revised: 02/19/2019 Document Reviewed: 09/10/2017 Elsevier Patient Education  2020 [employer].   Post Anesthesia Home Care Instructions  Activity: Get plenty of rest for the remainder of the day. A responsible individual must stay with you for 24 hours following the procedure.  For the next 24 hours, DO NOT: -Drive a car -Advertising copywriter -Drink alcoholic beverages -Take any medication unless instructed by your physician -Make any legal decisions or sign important papers.  Meals: Start with liquid foods such as gelatin or soup. Progress to regular foods as tolerated. Avoid greasy, spicy, heavy foods. If nausea and/or vomiting occur, drink only clear liquids until the nausea and/or  vomiting subsides. Call your physician if vomiting continues.  Special Instructions/Symptoms: Your throat may feel dry or sore from the anesthesia or the breathing tube placed in your throat during surgery. If this causes discomfort, gargle with warm salt water. The discomfort should disappear within 24 hours.  No ibuprofen, Advil, Aleve, Motrin, or naproxen until after 2:30 pm today if needed.

## 2020-08-04 ENCOUNTER — Encounter (HOSPITAL_BASED_OUTPATIENT_CLINIC_OR_DEPARTMENT_OTHER): Payer: Self-pay | Admitting: Obstetrics and Gynecology

## 2020-08-08 ENCOUNTER — Telehealth: Payer: Self-pay | Admitting: Psychiatry

## 2020-08-08 ENCOUNTER — Telehealth: Payer: BC Managed Care – PPO | Admitting: Psychiatry

## 2020-08-08 NOTE — Telephone Encounter (Signed)
Error

## 2020-08-08 NOTE — Telephone Encounter (Signed)
Addendum to

## 2020-08-19 ENCOUNTER — Encounter: Payer: Self-pay | Admitting: Psychiatry

## 2020-08-19 ENCOUNTER — Other Ambulatory Visit: Payer: Self-pay

## 2020-08-19 ENCOUNTER — Ambulatory Visit (INDEPENDENT_AMBULATORY_CARE_PROVIDER_SITE_OTHER): Payer: BC Managed Care – PPO | Admitting: Psychiatry

## 2020-08-19 VITALS — BP 125/76 | HR 93

## 2020-08-19 DIAGNOSIS — F39 Unspecified mood [affective] disorder: Secondary | ICD-10-CM

## 2020-08-19 DIAGNOSIS — F900 Attention-deficit hyperactivity disorder, predominantly inattentive type: Secondary | ICD-10-CM

## 2020-08-19 DIAGNOSIS — F419 Anxiety disorder, unspecified: Secondary | ICD-10-CM

## 2020-08-19 DIAGNOSIS — G4700 Insomnia, unspecified: Secondary | ICD-10-CM | POA: Diagnosis not present

## 2020-08-19 MED ORDER — METHYLPHENIDATE HCL ER (OSM) 36 MG PO TBCR: 36.0000 mg | EXTENDED_RELEASE_TABLET | Freq: Every day | ORAL | 0 refills | Status: DC

## 2020-08-19 MED ORDER — SERTRALINE HCL 100 MG PO TABS: 200.0000 mg | ORAL_TABLET | Freq: Every day | ORAL | 1 refills | Status: DC

## 2020-08-19 MED ORDER — ALPRAZOLAM 0.5 MG PO TABS: 0.5000 mg | ORAL_TABLET | Freq: Every day | ORAL | 2 refills | Status: DC | PRN

## 2020-08-19 MED ORDER — TRAZODONE HCL 100 MG PO TABS: ORAL_TABLET | ORAL | 1 refills | Status: DC

## 2020-08-19 NOTE — Progress Notes (Signed)
Dawn King [facility] [id number] [birth year] 29 y.o.  Subjective:   Patient ID:  Dawn King is a 29 y.o. (DOB 03/28/1991) female.  Chief Complaint:  Chief Complaint  Patient presents with  . Anxiety  . ADHD  . Depression  . Insomnia    HPI Dawn King [facility] presents to the office today for follow-up of depression, anxiety, insomnia, and ADD. She reports that she had severe pain and had difficulty working for about 2 months. She had kidney stones and ovarian cysts were found on a scan. She was recently dx'd with endometriosis and had ablation.   She reports that she has had some anxiety with recent health issues and not being able to be more active. Has had some depression in response to health issues. She returned to work this week after surgery and reports that this has been helpful for her mood. She has been having some anxiety at night- "like I am afraid of sleep." Notices increased anxiety when she turns off the light. Denies panic attacks. She reports that Trazodone helps get her to sleep. Sleeping 6-7 hours a night and usually needs about 9 hours of sleep to feel rested. Appetite has been ok. Reports that about 6 weeks ago she had period of increased hunger. Energy improving now after surgery. Motivation is ok. Reports having RLS last night and denies any other issues with RLS. She has not been taking Concerta since she has not been working. She reports that concentration has been ok, "just not as good as I could be" with Concerta. She reports that she has had some trouble focusing at work today. Denies SI.    Dog died in late 05/03/23 and reports that this was a very difficult loss for her.   Past Psychiatric Medication Trials: Concerta- Helpful. Started in 5th grade. Took it sporadically about 5-6 years ago and not since. Does not recall side effects. Took 36 mg po qd. Adderall- Had loss of appetite. May have been XR. Sertraline- Has taken since 8th grade. Has been  helpful for mood and anxiety.  Paxil- Took prior to 8th grade. Lamictal-Started 5 years ago. Has been helpful for depression and mood lability.  Hydroxyzine- allergic reaction Xanax-Intermittentlyeffective Valium- prescribed for muscle spasms  May have taken ambien in HS and had excessive somnolence. May have taken Abilify in HS and states she does not remember an entire month.  Review of Systems:  Review of Systems  Cardiovascular: Negative for palpitations.  Genitourinary:       Dx'd with endometriosis and had ablation about 2 weeks ago.  Musculoskeletal: Negative for gait problem.  Neurological: Positive for tremors.       Reports tremor in her right hand since 29 yo  Psychiatric/Behavioral:       Please refer to HPI       Medications: I have reviewed the patient's current medications.  Current Outpatient Medications  Medication Sig Dispense Refill  . ALPRAZolam (XANAX) 0.5 MG tablet Take 1 tablet (0.5 mg total) by mouth daily as needed for anxiety. 15 tablet 2  . lamoTRIgine (LAMICTAL) 200 MG tablet Take 1 tablet (200 mg total) by mouth at bedtime. 90 tablet 0  . omeprazole (PRILOSEC) 20 MG capsule Take 1 capsule (20 mg total) by mouth daily. (Patient taking differently: Take 20 mg by mouth as needed. ) 30 capsule 0  . promethazine (PHENERGAN) 25 MG tablet Take 25 mg by mouth every 6 (six) hours as needed for nausea or vomiting.    Marland Kitchen  traZODone (DESYREL) 100 MG tablet Take 1/2-1 tablet po QHS prn insomnia 90 tablet 1  . cyclobenzaprine (FLEXERIL) 5 MG tablet cyclobenzaprine 5 mg tablet  TAKE 1 TABLET BY MOUTH THREE TIMES DAILY FOR 10 DAYS AS NEEDED    . ESTARYLLA 0.25-35 MG-MCG tablet Take 1 tablet by mouth daily.    . methylphenidate (CONCERTA) 36 MG PO CR tablet Take 1 tablet (36 mg total) by mouth daily. 30 tablet 0  . [START ON 09/16/2020] methylphenidate (CONCERTA) 36 MG PO CR tablet Take 1 tablet (36 mg total) by mouth daily. 30 tablet 0  . [START ON 10/14/2020]  methylphenidate (CONCERTA) 36 MG PO CR tablet Take 1 tablet (36 mg total) by mouth daily. 30 tablet 0  . sertraline (ZOLOFT) 100 MG tablet Take 2 tablets (200 mg total) by mouth at bedtime. 180 tablet 1   No current facility-administered medications for this visit.    Medication Side Effects: None  Allergies:  Allergies  Allergen Reactions  . Levaquin [Levofloxacin] Anaphylaxis and Rash  . Cefaclor Hives and Rash    severe rash  . Diphenhydramine Hives  . Hydroxyzine Hives  . Percocet [Oxycodone-Acetaminophen] Hives    12/19/18: patient reports that she tolerates Oxycodone (but not Percocet)  . Sulfonamide Derivatives Hives and Rash    severe rash  . Tramadol Nausea And Vomiting  . Other Itching    *Cantoloupe* makes mouth really itchy   . Sunflower Oil Itching    Mouth really itchy   . Clindamycin/Lincomycin Rash  . Codeine Rash  . Keflex [Cephalexin] Rash  . Morphine And Related Rash  . Zofran [Ondansetron] Rash    Past Medical History:  Diagnosis Date  . Anxiety   . Depression   . Family history of adverse reaction to anesthesia    maternal aunt had difficulty waken up.   Marland Kitchen. GERD (gastroesophageal reflux disease)   . H/O multiple concussions    no residual  . Heart murmur    as a child; had a heart defect. outgrew it  . Hypothyroidism    no meds for slightlty below normal   . Pelvic pain   . Pneumonia last time 2014  . PONV (postoperative nausea and vomiting)    with sx age 29 none since  . Trauma 09/2010   cracked liver from being "stomped on by a horse".    Family History  Problem Relation Age of Onset  . Depression Father   . Depression Sister   . Anxiety disorder Sister   . Depression Paternal Uncle   . Autism Cousin   . Autism Cousin     Social History   Socioeconomic History  . Marital status: Single    Spouse name: Not on file  . Number of children: Not on file  . Years of education: Not on file  . Highest education level: Not on file   Occupational History  . Not on file  Tobacco Use  . Smoking status: Former Smoker    Years: 3.00    Types: Cigarettes    Quit date: 08/12/2018    Years since quitting: 2.0  . Smokeless tobacco: Never Used  . Tobacco comment: social  Vaping Use  . Vaping Use: Some days  . Last attempt to quit: 11/12/2017  . Substances: THC  Substance and Sexual Activity  . Alcohol use: Yes    Comment: rare  . Drug use: Yes    Types: Marijuana    Comment: occ marijuanana last used 07-27-2020  .  Sexual activity: Not on file  Other Topics Concern  . Not on file  Social History Narrative  . Not on file   Social Determinants of Health   Financial Resource Strain:   . Difficulty of Paying Living Expenses: Not on file  Food Insecurity:   . Worried About Programme researcher, broadcasting/film/video in the Last Year: Not on file  . Ran Out of Food in the Last Year: Not on file  Transportation Needs:   . Lack of Transportation (Medical): Not on file  . Lack of Transportation (Non-Medical): Not on file  Physical Activity:   . Days of Exercise per Week: Not on file  . Minutes of Exercise per Session: Not on file  Stress:   . Feeling of Stress : Not on file  Social Connections:   . Frequency of Communication with Friends and Family: Not on file  . Frequency of Social Gatherings with Friends and Family: Not on file  . Attends Religious Services: Not on file  . Active Member of Clubs or Organizations: Not on file  . Attends Banker Meetings: Not on file  . Marital Status: Not on file  Intimate Partner Violence:   . Fear of Current or Ex-Partner: Not on file  . Emotionally Abused: Not on file  . Physically Abused: Not on file  . Sexually Abused: Not on file    Past Medical History, Surgical history, Social history, and Family history were reviewed and updated as appropriate.   Please see review of systems for further details on the patient's review from today.   Objective:   Physical Exam:  BP 125/76    Pulse 93   LMP 07/25/2020   Physical Exam Constitutional:      General: She is not in acute distress. Musculoskeletal:        General: No deformity.  Neurological:     Mental Status: She is alert and oriented to person, place, and time.     Coordination: Coordination normal.  Psychiatric:        Attention and Perception: Attention and perception normal. She does not perceive auditory or visual hallucinations.        Mood and Affect: Mood normal. Mood is not anxious or depressed. Affect is not labile, blunt, angry or inappropriate.        Speech: Speech normal.        Behavior: Behavior normal.        Thought Content: Thought content normal. Thought content is not paranoid or delusional. Thought content does not include homicidal or suicidal ideation. Thought content does not include homicidal or suicidal plan.        Cognition and Memory: Cognition and memory normal.        Judgment: Judgment normal.     Comments: Insight intact     Lab Review:     Component Value Date/Time   NA 138 08/01/2020 1107   K 4.2 08/01/2020 1107   CL 101 08/01/2020 1107   CO2 26 08/01/2020 1107   GLUCOSE 94 08/01/2020 1107   BUN 12 08/01/2020 1107   CREATININE 0.60 08/01/2020 1107   CALCIUM 8.9 08/01/2020 1107   PROT 7.2 06/19/2019 1224   ALBUMIN 3.2 (L) 06/19/2019 1224   AST 11 (L) 06/19/2019 1224   ALT 11 06/19/2019 1224   ALKPHOS 87 06/19/2019 1224   BILITOT 0.4 06/19/2019 1224   GFRNONAA >60 08/01/2020 1107   GFRAA >60 08/01/2020 1107       Component Value  Date/Time   WBC 12.9 (H) 08/01/2020 1107   RBC 5.34 (H) 08/01/2020 1107   HGB 11.5 (L) 08/01/2020 1107   HCT 38.4 08/01/2020 1107   PLT 490 (H) 08/01/2020 1107   MCV 71.9 (L) 08/01/2020 1107   MCH 21.5 (L) 08/01/2020 1107   MCHC 29.9 (L) 08/01/2020 1107   RDW 17.2 (H) 08/01/2020 1107   LYMPHSABS 3.5 08/01/2020 1107   MONOABS 0.7 08/01/2020 1107   EOSABS 0.1 08/01/2020 1107   BASOSABS 0.0 08/01/2020 1107    No results  found for: POCLITH, LITHIUM   No results found for: PHENYTOIN, PHENOBARB, VALPROATE, CBMZ   .res Assessment: Plan:    Continue Concerta 36 mg po qd for ADD. Continue Lamictal 200 mg po qd for depression. Continue Sertraline 200 mg qd for anxiety and depression.  Continue Trazodone 100 mg 1/2-1 tab po QHS prn for insomnia.  Will assume management of Alprazolam prn anxiety.  Pt to follow-up in 3 months or sooner if clinically indicated.  Patient advised to contact office with any questions, adverse effects, or acute worsening in signs and symptoms.   Darielys was seen today for anxiety, adhd, depression and insomnia.  Diagnoses and all orders for this visit:  Anxiety disorder, unspecified type -     ALPRAZolam (XANAX) 0.5 MG tablet; Take 1 tablet (0.5 mg total) by mouth daily as needed for anxiety. -     sertraline (ZOLOFT) 100 MG tablet; Take 2 tablets (200 mg total) by mouth at bedtime.  Attention deficit hyperactivity disorder (ADHD), predominantly inattentive type -     methylphenidate (CONCERTA) 36 MG PO CR tablet; Take 1 tablet (36 mg total) by mouth daily. -     methylphenidate (CONCERTA) 36 MG PO CR tablet; Take 1 tablet (36 mg total) by mouth daily. -     methylphenidate (CONCERTA) 36 MG PO CR tablet; Take 1 tablet (36 mg total) by mouth daily.  Episodic mood disorder (HCC) -     sertraline (ZOLOFT) 100 MG tablet; Take 2 tablets (200 mg total) by mouth at bedtime.  Insomnia, unspecified type -     traZODone (DESYREL) 100 MG tablet; Take 1/2-1 tablet po QHS prn insomnia     Please see After Visit Summary for patient specific instructions.  Future Appointments  Date Time Provider Department Center  11/18/2020  1:00 PM Corie Chiquito, PMHNP [facility] None    No orders of the defined types were placed in this encounter.   -------------------------------

## 2020-08-24 DIAGNOSIS — F909 Attention-deficit hyperactivity disorder, unspecified type: Secondary | ICD-10-CM | POA: Diagnosis not present

## 2020-08-24 DIAGNOSIS — Z23 Encounter for immunization: Secondary | ICD-10-CM | POA: Diagnosis not present

## 2020-08-24 DIAGNOSIS — F32A Depression, unspecified: Secondary | ICD-10-CM | POA: Diagnosis not present

## 2020-08-24 DIAGNOSIS — Z889 Allergy status to unspecified drugs, medicaments and biological substances status: Secondary | ICD-10-CM | POA: Diagnosis not present

## 2020-08-31 ENCOUNTER — Other Ambulatory Visit: Payer: Self-pay

## 2020-08-31 ENCOUNTER — Telehealth: Payer: Self-pay | Admitting: Psychiatry

## 2020-08-31 DIAGNOSIS — F39 Unspecified mood [affective] disorder: Secondary | ICD-10-CM

## 2020-08-31 MED ORDER — LAMOTRIGINE 200 MG PO TABS: 200.0000 mg | ORAL_TABLET | Freq: Every day | ORAL | 0 refills | Status: DC

## 2020-08-31 NOTE — Telephone Encounter (Signed)
Rx sent 

## 2020-08-31 NOTE — Telephone Encounter (Signed)
Dawn King called to request refill of her Lamictal.  She forgot to request it when she was here for her appt.  She asked the pharmacy to send the refill but they it to the wrong Dr. So it was denied.  She uses the [facility] on [address].  She says they have been a mess for the last month so asks that we send in the prescription versus waiting for them to get it right.  appt 11/2020

## 2020-09-01 DIAGNOSIS — R739 Hyperglycemia, unspecified: Secondary | ICD-10-CM | POA: Diagnosis not present

## 2020-09-01 DIAGNOSIS — Z0001 Encounter for general adult medical examination with abnormal findings: Secondary | ICD-10-CM | POA: Diagnosis not present

## 2020-09-01 DIAGNOSIS — Z136 Encounter for screening for cardiovascular disorders: Secondary | ICD-10-CM | POA: Diagnosis not present

## 2020-09-01 DIAGNOSIS — D509 Iron deficiency anemia, unspecified: Secondary | ICD-10-CM | POA: Diagnosis not present

## 2020-10-13 DIAGNOSIS — Z20822 Contact with and (suspected) exposure to covid-19: Secondary | ICD-10-CM | POA: Diagnosis not present

## 2020-10-25 DIAGNOSIS — R0981 Nasal congestion: Secondary | ICD-10-CM | POA: Diagnosis not present

## 2020-10-25 DIAGNOSIS — J209 Acute bronchitis, unspecified: Secondary | ICD-10-CM | POA: Diagnosis not present

## 2020-10-25 DIAGNOSIS — R000 Tachycardia, unspecified: Secondary | ICD-10-CM | POA: Diagnosis not present

## 2020-10-25 DIAGNOSIS — M791 Myalgia, unspecified site: Secondary | ICD-10-CM | POA: Diagnosis not present

## 2020-10-25 DIAGNOSIS — J019 Acute sinusitis, unspecified: Secondary | ICD-10-CM | POA: Diagnosis not present

## 2020-11-14 ENCOUNTER — Telehealth: Payer: Self-pay | Admitting: Psychiatry

## 2020-11-14 DIAGNOSIS — F39 Unspecified mood [affective] disorder: Secondary | ICD-10-CM

## 2020-11-14 MED ORDER — LAMOTRIGINE 200 MG PO TABS: 200.0000 mg | ORAL_TABLET | Freq: Every day | ORAL | 0 refills | Status: DC

## 2020-11-14 NOTE — Telephone Encounter (Signed)
Received refill request from pharmacy for lamotrigine 200 mg daily.  Refill sent.

## 2020-11-18 ENCOUNTER — Ambulatory Visit (INDEPENDENT_AMBULATORY_CARE_PROVIDER_SITE_OTHER): Payer: BC Managed Care – PPO | Admitting: Psychiatry

## 2020-11-18 ENCOUNTER — Other Ambulatory Visit: Payer: Self-pay

## 2020-11-18 ENCOUNTER — Encounter: Payer: Self-pay | Admitting: Psychiatry

## 2020-11-18 VITALS — BP 129/74 | HR 83

## 2020-11-18 DIAGNOSIS — F900 Attention-deficit hyperactivity disorder, predominantly inattentive type: Secondary | ICD-10-CM | POA: Diagnosis not present

## 2020-11-18 DIAGNOSIS — G4700 Insomnia, unspecified: Secondary | ICD-10-CM | POA: Diagnosis not present

## 2020-11-18 DIAGNOSIS — F39 Unspecified mood [affective] disorder: Secondary | ICD-10-CM

## 2020-11-18 DIAGNOSIS — F419 Anxiety disorder, unspecified: Secondary | ICD-10-CM

## 2020-11-18 MED ORDER — LAMOTRIGINE 200 MG PO TABS: 200.0000 mg | ORAL_TABLET | Freq: Every day | ORAL | 0 refills | Status: DC

## 2020-11-18 MED ORDER — TRAZODONE HCL 100 MG PO TABS: ORAL_TABLET | ORAL | 1 refills | Status: DC

## 2020-11-18 MED ORDER — SERTRALINE HCL 100 MG PO TABS: 200.0000 mg | ORAL_TABLET | Freq: Every day | ORAL | 1 refills | Status: DC

## 2020-11-18 MED ORDER — JORNAY PM 40 MG PO CP24: 40.0000 mg | ORAL_CAPSULE | Freq: Every evening | ORAL | 0 refills | Status: DC

## 2020-11-18 NOTE — Progress Notes (Signed)
Oriel Ojo [facility] [id number] Aug 26, 1991 30 y.o.  Subjective:   Patient ID:  Dawn King is a 30 y.o. (DOB 20-Apr-1991) female.  Chief Complaint:  Chief Complaint  Patient presents with  . ADHD    HPI Ngozi Alvidrez presents to the office today for follow-up of ADHD, depression, anxiety, insomnia, and ADD. She reports that she has been tired and work has been very busy. She reports that anxiety has been well controlled and has not needed Xanax prn recently. Mood has been stable. She reports that she was able to remain calm in a conversation earlier today that normally would cause significant irritability. She reports that she forgets to take Concerta and usually takes it only 2-3 days a week. She reports that concentration is improved when she takes Concerta. Concerta was last filled 08/19/20. She reports that she has "never been a morning person." She reports that Trazodone has been helpful for her sleep. Energy has been low. Motivation has been ok. Appetite has been good. Denies SI.   Has not seen Bennie Dallas, [facility].   They are going to be getting a new puppy, a miniature poodle. Continues to grieve the loss of her dog that died in 05-05-23.    Past Psychiatric Medication Trials: Concerta- Helpful. Started in 5th grade. Took it sporadically about 5-6 years ago and not since. Does not recall side effects. Took 36 mg po qd. Adderall- Had loss of appetite. May have been XR. Sertraline- Has taken since 8th grade. Has been helpful for mood and anxiety.  Paxil- Took prior to 8th grade. Lamictal-Started 5 years ago. Has been helpful for depression and mood lability.  Hydroxyzine- allergic reaction Xanax-Intermittentlyeffective Valium- prescribed for muscle spasms  Review of Systems:  Review of Systems  Cardiovascular: Negative for palpitations.  Musculoskeletal: Negative for back pain and gait problem.  Neurological: Positive for tremors.  Psychiatric/Behavioral:        Please refer to HPI    Medications: I have reviewed the patient's current medications.  Current Outpatient Medications  Medication Sig Dispense Refill  . ESTARYLLA 0.25-35 MG-MCG tablet Take 1 tablet by mouth daily.    . methylphenidate (CONCERTA) 36 MG PO CR tablet Take 1 tablet (36 mg total) by mouth daily. 30 tablet 0  . Methylphenidate HCl ER, PM, (JORNAY PM) 40 MG CP24 Take 40 mg by mouth every evening. 30 capsule 0  . ALPRAZolam (XANAX) 0.5 MG tablet Take 1 tablet (0.5 mg total) by mouth daily as needed for anxiety. 15 tablet 2  . lamoTRIgine (LAMICTAL) 200 MG tablet Take 1 tablet (200 mg total) by mouth at bedtime. 90 tablet 0  . methylphenidate (CONCERTA) 36 MG PO CR tablet Take 1 tablet (36 mg total) by mouth daily. 30 tablet 0  . omeprazole (PRILOSEC) 20 MG capsule Take 1 capsule (20 mg total) by mouth daily. (Patient taking differently: Take 20 mg by mouth as needed.) 30 capsule 0  . promethazine (PHENERGAN) 25 MG tablet Take 25 mg by mouth every 6 (six) hours as needed for nausea or vomiting.    . sertraline (ZOLOFT) 100 MG tablet Take 2 tablets (200 mg total) by mouth at bedtime. 180 tablet 1  . traZODone (DESYREL) 100 MG tablet Take 1/2-1 tablet po QHS prn insomnia 90 tablet 1   No current facility-administered medications for this visit.    Medication Side Effects: None  Allergies:  Allergies  Allergen Reactions  . Levaquin [Levofloxacin] Anaphylaxis and Rash  . Cefaclor Hives and Rash  severe rash  . Diphenhydramine Hives  . Hydroxyzine Hives  . Percocet [Oxycodone-Acetaminophen] Hives    12/19/18: patient reports that she tolerates Oxycodone (but not Percocet)  . Sulfonamide Derivatives Hives and Rash    severe rash  . Tramadol Nausea And Vomiting  . Other Itching    *Cantoloupe* makes mouth really itchy   . Sunflower Oil Itching    Mouth really itchy   . Clindamycin/Lincomycin Rash  . Codeine Rash  . Keflex [Cephalexin] Rash  . Morphine And Related  Rash  . Zofran [Ondansetron] Rash    Past Medical History:  Diagnosis Date  . Anxiety   . Depression   . Family history of adverse reaction to anesthesia    maternal aunt had difficulty waken up.   Marland Kitchen GERD (gastroesophageal reflux disease)   . H/O multiple concussions    no residual  . Heart murmur    as a child; had a heart defect. outgrew it  . Hypothyroidism    no meds for slightlty below normal   . Pelvic pain   . Pneumonia last time 2014  . PONV (postoperative nausea and vomiting)    with sx age 30 none since  . Trauma 09/2010   cracked liver from being "stomped on by a horse".    Family History  Problem Relation Age of Onset  . Depression Father   . Depression Sister   . Anxiety disorder Sister   . Depression Paternal Uncle   . Autism Cousin   . Autism Cousin     Social History   Socioeconomic History  . Marital status: Single    Spouse name: Not on file  . Number of children: Not on file  . Years of education: Not on file  . Highest education level: Not on file  Occupational History  . Not on file  Tobacco Use  . Smoking status: Former Smoker    Years: 3.00    Types: Cigarettes    Quit date: 08/12/2018    Years since quitting: 2.2  . Smokeless tobacco: Never Used  . Tobacco comment: social  Vaping Use  . Vaping Use: Some days  . Last attempt to quit: 11/12/2017  . Substances: THC  Substance and Sexual Activity  . Alcohol use: Yes    Comment: rare  . Drug use: Yes    Types: Marijuana    Comment: occ marijuanana last used 07-27-2020  . Sexual activity: Not on file  Other Topics Concern  . Not on file  Social History Narrative  . Not on file   Social Determinants of Health   Financial Resource Strain: Not on file  Food Insecurity: Not on file  Transportation Needs: Not on file  Physical Activity: Not on file  Stress: Not on file  Social Connections: Not on file  Intimate Partner Violence: Not on file    Past Medical History, Surgical  history, Social history, and Family history were reviewed and updated as appropriate.   Please see review of systems for further details on the patient's review from today.   Objective:   Physical Exam:  BP 129/74   Pulse 83   Physical Exam Constitutional:      General: She is not in acute distress. Musculoskeletal:        General: No deformity.  Neurological:     Mental Status: She is alert and oriented to person, place, and time.     Coordination: Coordination normal.  Psychiatric:  Attention and Perception: Attention and perception normal. She does not perceive auditory or visual hallucinations.        Mood and Affect: Mood normal. Mood is not anxious or depressed. Affect is not labile, blunt, angry or inappropriate.        Speech: Speech normal.        Behavior: Behavior normal.        Thought Content: Thought content normal. Thought content is not paranoid or delusional. Thought content does not include homicidal or suicidal ideation. Thought content does not include homicidal or suicidal plan.        Cognition and Memory: Cognition and memory normal.        Judgment: Judgment normal.     Comments: Insight intact     Lab Review:     Component Value Date/Time   NA 138 08/01/2020 1107   K 4.2 08/01/2020 1107   CL 101 08/01/2020 1107   CO2 26 08/01/2020 1107   GLUCOSE 94 08/01/2020 1107   BUN 12 08/01/2020 1107   CREATININE 0.60 08/01/2020 1107   CALCIUM 8.9 08/01/2020 1107   PROT 7.2 06/19/2019 1224   ALBUMIN 3.2 (L) 06/19/2019 1224   AST 11 (L) 06/19/2019 1224   ALT 11 06/19/2019 1224   ALKPHOS 87 06/19/2019 1224   BILITOT 0.4 06/19/2019 1224   GFRNONAA >60 08/01/2020 1107   GFRAA >60 08/01/2020 1107       Component Value Date/Time   WBC 12.9 (H) 08/01/2020 1107   RBC 5.34 (H) 08/01/2020 1107   HGB 11.5 (L) 08/01/2020 1107   HCT 38.4 08/01/2020 1107   PLT 490 (H) 08/01/2020 1107   MCV 71.9 (L) 08/01/2020 1107   MCH 21.5 (L) 08/01/2020 1107   MCHC  29.9 (L) 08/01/2020 1107   RDW 17.2 (H) 08/01/2020 1107   LYMPHSABS 3.5 08/01/2020 1107   MONOABS 0.7 08/01/2020 1107   EOSABS 0.1 08/01/2020 1107   BASOSABS 0.0 08/01/2020 1107    No results found for: POCLITH, LITHIUM   No results found for: PHENYTOIN, PHENOBARB, VALPROATE, CBMZ   .res Assessment: Plan:    Discussed potential benefits, risks, and side effects of Jornay PM since she reports that she often forgets to take Concerta in the morning and has difficulty waking up in the morning and getting to work on time. Pt agrees to trial of Jornay PM. Will start Jornay PM 40 mg every evening.  Continue Sertraline 200 mg po qd for depression and anxiety. Continue Lamictal 200 mg po qd for modo stabilization.  Continue Trazodone 100 mg 1/2-1 tab po QHS prn insomnia.  Continue Xanax prn anxiety. She reports that she does not need a script at this time.  Pt to follow-up in 6-8 weeks or sooner if clinically indicated.  Patient advised to contact office with any questions, adverse effects, or acute worsening in signs and symptoms.   Jesyka was seen today for adhd.  Diagnoses and all orders for this visit:  Attention deficit hyperactivity disorder (ADHD), predominantly inattentive type -     Methylphenidate HCl ER, PM, (JORNAY PM) 40 MG CP24; Take 40 mg by mouth every evening.  Episodic mood disorder (HCC) -     lamoTRIgine (LAMICTAL) 200 MG tablet; Take 1 tablet (200 mg total) by mouth at bedtime. -     sertraline (ZOLOFT) 100 MG tablet; Take 2 tablets (200 mg total) by mouth at bedtime.  Anxiety disorder, unspecified type -     sertraline (ZOLOFT) 100 MG tablet; Take  2 tablets (200 mg total) by mouth at bedtime.  Insomnia, unspecified type -     traZODone (DESYREL) 100 MG tablet; Take 1/2-1 tablet po QHS prn insomnia     Please see After Visit Summary for patient specific instructions.  Future Appointments  Date Time Provider [place]  01/05/2021  4:00 PM Thayer Headings, PMHNP [facility] None    No orders of the defined types were placed in this encounter.   -------------------------------

## 2020-11-24 DIAGNOSIS — R945 Abnormal results of liver function studies: Secondary | ICD-10-CM | POA: Diagnosis not present

## 2020-11-24 DIAGNOSIS — D509 Iron deficiency anemia, unspecified: Secondary | ICD-10-CM | POA: Diagnosis not present

## 2021-01-05 ENCOUNTER — Ambulatory Visit: Payer: BC Managed Care – PPO | Admitting: Psychiatry

## 2021-02-16 ENCOUNTER — Ambulatory Visit (INDEPENDENT_AMBULATORY_CARE_PROVIDER_SITE_OTHER): Payer: BC Managed Care – PPO | Admitting: Psychiatry

## 2021-02-16 ENCOUNTER — Encounter: Payer: Self-pay | Admitting: Psychiatry

## 2021-02-16 ENCOUNTER — Other Ambulatory Visit: Payer: Self-pay

## 2021-02-16 VITALS — BP 142/77 | HR 87

## 2021-02-16 DIAGNOSIS — F419 Anxiety disorder, unspecified: Secondary | ICD-10-CM | POA: Diagnosis not present

## 2021-02-16 DIAGNOSIS — F39 Unspecified mood [affective] disorder: Secondary | ICD-10-CM | POA: Diagnosis not present

## 2021-02-16 DIAGNOSIS — F900 Attention-deficit hyperactivity disorder, predominantly inattentive type: Secondary | ICD-10-CM | POA: Diagnosis not present

## 2021-02-16 DIAGNOSIS — G4700 Insomnia, unspecified: Secondary | ICD-10-CM

## 2021-02-16 MED ORDER — JORNAY PM 40 MG PO CP24: 40.0000 mg | ORAL_CAPSULE | Freq: Every evening | ORAL | 0 refills | Status: DC

## 2021-02-16 MED ORDER — ALPRAZOLAM 0.5 MG PO TABS: 0.5000 mg | ORAL_TABLET | Freq: Every day | ORAL | 2 refills | Status: DC | PRN

## 2021-02-16 NOTE — Progress Notes (Signed)
Dawn King [id number] 1991-09-11 29 y.o.  Subjective:   Patient ID:  Dawn King is a 30 y.o. (DOB 1991-09-11) female.  Chief Complaint:  Chief Complaint  Patient presents with  . Follow-up    Anxiety, ADHD, Depression, and insomnia    HPI Dawn Dryriana Parham Librizzi presents to the office today for follow-up of ADHD, anxiety, insomnia, and depression. She reports that she has been having some difficulty with sleep if she does not take Trazodone. She reports that she sleeps through the night when she takes Trazodone. She reports that for 3 days she had anxiety with going to sleep and she used Xanax prn and this resolved. She reports that her anxiety has been "fine" in general. Denies depressed mood. Energy and motivation have been "average." Has not needed to nap recently. She reports adequate concentration for reading. She reports that her concentration was decreased when work was slow. She reports concentration has been adequate when work has been busier. She reports that her appetite has been ok and typically eats about 3 meals a day. Has made 2 friends from work and is getting together with friends at least once a week. Denies anhedonia. Denies SI.  She reports, "things are going well in my life." Has 2 miniatures poodle puppies.   Taking Concerta about 3 times a week since she forgets to take it in the morning.   Past Psychiatric Medication Trials: Concerta- Helpful. Started in 5th grade. Took it sporadically about 5-6 years ago and not since. Does not recall side effects. Took 36 mg po qd. Adderall- Had loss of appetite. May have been XR. Sertraline- Has taken since 8th grade. Has been helpful for mood and anxiety.  Paxil- Took prior to 8th grade. Lamictal-Started 5 years ago. Has been helpful for depression and mood lability.  Hydroxyzine- allergic reaction Xanax-Intermittentlyeffective Valium- prescribed for muscle spasms   Flowsheet Row ED from  11/07/2018 in [facility]GENCY DEPT  C-SSRS RISK CATEGORY No Risk       Review of Systems:  Review of Systems  Musculoskeletal: Negative for gait problem.  Neurological: Positive for tremors and headaches.       Mild right hand tremor that she reports is long-standing  Psychiatric/Behavioral:       Please refer to HPI    Medications: I have reviewed the patient's current medications.  Current Outpatient Medications  Medication Sig Dispense Refill  . ESTARYLLA 0.25-35 MG-MCG tablet Take 1 tablet by mouth daily.    . Ferrous Sulfate (IRON PO) Take by mouth.    . lamoTRIgine (LAMICTAL) 200 MG tablet Take 1 tablet (200 mg total) by mouth at bedtime. 90 tablet 0  . omeprazole (PRILOSEC) 20 MG capsule Take 1 capsule (20 mg total) by mouth daily. (Patient taking differently: Take 20 mg by mouth as needed.) 30 capsule 0  . sertraline (ZOLOFT) 100 MG tablet Take 2 tablets (200 mg total) by mouth at bedtime. 180 tablet 1  . traZODone (DESYREL) 100 MG tablet Take 1/2-1 tablet po QHS prn insomnia 90 tablet 1  . ALPRAZolam (XANAX) 0.5 MG tablet Take 1 tablet (0.5 mg total) by mouth daily as needed for anxiety. 15 tablet 2  . methylphenidate (CONCERTA) 36 MG PO CR tablet Take 1 tablet (36 mg total) by mouth daily. 30 tablet 0  . methylphenidate (CONCERTA) 36 MG PO CR tablet Take 1 tablet (36 mg total) by mouth daily. 30 tablet 0  . Methylphenidate HCl ER, PM, (JORNAY PM) 40 MG  CP24 Take 40 mg by mouth every evening. 30 capsule 0  . promethazine (PHENERGAN) 25 MG tablet Take 25 mg by mouth every 6 (six) hours as needed for nausea or vomiting. (Patient not taking: Reported on 02/16/2021)     No current facility-administered medications for this visit.    Medication Side Effects: None  Allergies:  Allergies  Allergen Reactions  . Levaquin [Levofloxacin] Anaphylaxis and Rash  . Cefaclor Hives and Rash    severe rash  . Diphenhydramine Hives  . Hydroxyzine Hives  .  Percocet [Oxycodone-Acetaminophen] Hives    12/19/18: patient reports that she tolerates Oxycodone (but not Percocet)  . Sulfonamide Derivatives Hives and Rash    severe rash  . Tramadol Nausea And Vomiting  . Other Itching    *Cantoloupe* makes mouth really itchy   . Sunflower Oil Itching    Mouth really itchy   . Clindamycin/Lincomycin Rash  . Codeine Rash  . Keflex [Cephalexin] Rash  . Morphine And Related Rash  . Zofran [Ondansetron] Rash    Past Medical History:  Diagnosis Date  . Anxiety   . Depression   . Family history of adverse reaction to anesthesia    maternal aunt had difficulty waken up.   Marland Kitchen GERD (gastroesophageal reflux disease)   . H/O multiple concussions    no residual  . Heart murmur    as a child; had a heart defect. outgrew it  . Hypothyroidism    no meds for slightlty below normal   . Pelvic pain   . Pneumonia last time 2014  . PONV (postoperative nausea and vomiting)    with sx age 8 none since  . Trauma 09/2010   cracked liver from being "stomped on by a horse".    Family History  Problem Relation Age of Onset  . Depression Father   . Depression Sister   . Anxiety disorder Sister   . Depression Paternal Uncle   . Autism Cousin   . Autism Cousin     Social History   Socioeconomic History  . Marital status: Single    Spouse name: Not on file  . Number of children: Not on file  . Years of education: Not on file  . Highest education level: Not on file  Occupational History  . Not on file  Tobacco Use  . Smoking status: Former Smoker    Years: 3.00    Types: Cigarettes    Quit date: 08/12/2018    Years since quitting: 2.5  . Smokeless tobacco: Never Used  . Tobacco comment: social  Vaping Use  . Vaping Use: Some days  . Last attempt to quit: 11/12/2017  . Substances: THC  Substance and Sexual Activity  . Alcohol use: Yes    Comment: rare  . Drug use: Yes    Types: Marijuana    Comment: occ marijuanana last used 07-27-2020  .  Sexual activity: Not on file  Other Topics Concern  . Not on file  Social History Narrative  . Not on file   Social Determinants of Health   Financial Resource Strain: Not on file  Food Insecurity: Not on file  Transportation Needs: Not on file  Physical Activity: Not on file  Stress: Not on file  Social Connections: Not on file  Intimate Partner Violence: Not on file    Past Medical History, Surgical history, Social history, and Family history were reviewed and updated as appropriate.   Please see review of systems for further details on  the patient's review from today.   Objective:   Physical Exam:  BP (!) 142/77   Pulse 87   Physical Exam Constitutional:      General: She is not in acute distress. Musculoskeletal:        General: No deformity.  Neurological:     Mental Status: She is alert and oriented to person, place, and time.     Coordination: Coordination normal.  Psychiatric:        Attention and Perception: Attention and perception normal. She does not perceive auditory or visual hallucinations.        Mood and Affect: Mood normal. Mood is not anxious or depressed. Affect is not labile, blunt, angry or inappropriate.        Speech: Speech normal.        Behavior: Behavior normal.        Thought Content: Thought content normal. Thought content is not paranoid or delusional. Thought content does not include homicidal or suicidal ideation. Thought content does not include homicidal or suicidal plan.        Cognition and Memory: Cognition and memory normal.        Judgment: Judgment normal.     Comments: Insight intact     Lab Review:     Component Value Date/Time   NA 138 08/01/2020 1107   K 4.2 08/01/2020 1107   CL 101 08/01/2020 1107   CO2 26 08/01/2020 1107   GLUCOSE 94 08/01/2020 1107   BUN 12 08/01/2020 1107   CREATININE 0.60 08/01/2020 1107   CALCIUM 8.9 08/01/2020 1107   PROT 7.2 06/19/2019 1224   ALBUMIN 3.2 (L) 06/19/2019 1224   AST 11 (L)  06/19/2019 1224   ALT 11 06/19/2019 1224   ALKPHOS 87 06/19/2019 1224   BILITOT 0.4 06/19/2019 1224   GFRNONAA >60 08/01/2020 1107   GFRAA >60 08/01/2020 1107       Component Value Date/Time   WBC 12.9 (H) 08/01/2020 1107   RBC 5.34 (H) 08/01/2020 1107   HGB 11.5 (L) 08/01/2020 1107   HCT 38.4 08/01/2020 1107   PLT 490 (H) 08/01/2020 1107   MCV 71.9 (L) 08/01/2020 1107   MCH 21.5 (L) 08/01/2020 1107   MCHC 29.9 (L) 08/01/2020 1107   RDW 17.2 (H) 08/01/2020 1107   LYMPHSABS 3.5 08/01/2020 1107   MONOABS 0.7 08/01/2020 1107   EOSABS 0.1 08/01/2020 1107   BASOSABS 0.0 08/01/2020 1107    No results found for: POCLITH, LITHIUM   No results found for: PHENYTOIN, PHENOBARB, VALPROATE, CBMZ   .res Assessment: Plan:   Will re-submit script for Jornay PM since pt reports that she was unable to obtain from pharmacy for reasons that are unclear (possibly required PA and/or not in stock). Recommend Ophelia Charter since pt reports that she is consistent with PM meds and has difficulty remembering to take morning medication and forgets to take Concerta most mornings. Pt to contact office if she has difficulty obtaining Jornay PM so that office can follow-up.  Continue Xanax prn anxiety.  Continue Sertraline 200 mg po qd for mood and anxiety.  Continue Lamictal 200 mg po qd for mood s/s.  Continue Trazodone 100 mg po QHS prn insomnia.  Pt to follow-up in 3 months or sooner if clinically indicated.  Patient advised to contact office with any questions, adverse effects, or acute worsening in signs and symptoms.  Alara was seen today for follow-up.  Diagnoses and all orders for this visit:  Episodic mood disorder (  HCC)  Attention deficit hyperactivity disorder (ADHD), predominantly inattentive type -     Methylphenidate HCl ER, PM, (JORNAY PM) 40 MG CP24; Take 40 mg by mouth every evening.  Anxiety disorder, unspecified type -     ALPRAZolam (XANAX) 0.5 MG tablet; Take 1 tablet (0.5 mg total)  by mouth daily as needed for anxiety.  Insomnia, unspecified type     Please see After Visit Summary for patient specific instructions.  Future Appointments  Date Time Provider Department Center  05/18/2021  1:30 PM Corie Chiquito, PMHNP [facility] None    No orders of the defined types were placed in this encounter.   -------------------------------

## 2021-02-21 DIAGNOSIS — R102 Pelvic and perineal pain: Secondary | ICD-10-CM | POA: Diagnosis not present

## 2021-03-01 ENCOUNTER — Telehealth: Payer: Self-pay

## 2021-03-01 NOTE — Telephone Encounter (Signed)
Prior approval received for JORNAY PM 40 MG ER effective 02/27/2021-02/26/2022 with [employer] ID# [id number]

## 2021-03-13 DIAGNOSIS — R102 Pelvic and perineal pain: Secondary | ICD-10-CM | POA: Diagnosis not present

## 2021-03-16 DIAGNOSIS — Z30013 Encounter for initial prescription of injectable contraceptive: Secondary | ICD-10-CM | POA: Diagnosis not present

## 2021-04-03 DIAGNOSIS — Z01419 Encounter for gynecological examination (general) (routine) without abnormal findings: Secondary | ICD-10-CM | POA: Diagnosis not present

## 2021-04-03 DIAGNOSIS — Z6841 Body Mass Index (BMI) 40.0 and over, adult: Secondary | ICD-10-CM | POA: Diagnosis not present

## 2021-04-03 DIAGNOSIS — Z1239 Encounter for other screening for malignant neoplasm of breast: Secondary | ICD-10-CM | POA: Diagnosis not present

## 2021-04-05 ENCOUNTER — Other Ambulatory Visit: Payer: Self-pay | Admitting: Obstetrics and Gynecology

## 2021-04-05 DIAGNOSIS — Z803 Family history of malignant neoplasm of breast: Secondary | ICD-10-CM

## 2021-04-18 ENCOUNTER — Other Ambulatory Visit: Payer: Self-pay

## 2021-04-18 DIAGNOSIS — F419 Anxiety disorder, unspecified: Secondary | ICD-10-CM

## 2021-04-18 MED ORDER — ALPRAZOLAM 0.5 MG PO TABS: 0.5000 mg | ORAL_TABLET | Freq: Every day | ORAL | 2 refills | Status: DC | PRN

## 2021-05-18 ENCOUNTER — Encounter: Payer: Self-pay | Admitting: Psychiatry

## 2021-05-18 ENCOUNTER — Telehealth (INDEPENDENT_AMBULATORY_CARE_PROVIDER_SITE_OTHER): Payer: BC Managed Care – PPO | Admitting: Psychiatry

## 2021-05-18 ENCOUNTER — Telehealth: Payer: BC Managed Care – PPO | Admitting: Psychiatry

## 2021-05-18 DIAGNOSIS — F39 Unspecified mood [affective] disorder: Secondary | ICD-10-CM | POA: Diagnosis not present

## 2021-05-18 DIAGNOSIS — G4700 Insomnia, unspecified: Secondary | ICD-10-CM

## 2021-05-18 DIAGNOSIS — F419 Anxiety disorder, unspecified: Secondary | ICD-10-CM | POA: Diagnosis not present

## 2021-05-18 DIAGNOSIS — F900 Attention-deficit hyperactivity disorder, predominantly inattentive type: Secondary | ICD-10-CM | POA: Diagnosis not present

## 2021-05-18 MED ORDER — JORNAY PM 40 MG PO CP24: 40.0000 mg | ORAL_CAPSULE | Freq: Every evening | ORAL | 0 refills | Status: DC

## 2021-05-18 MED ORDER — LAMOTRIGINE 200 MG PO TABS: 200.0000 mg | ORAL_TABLET | Freq: Every day | ORAL | 1 refills | Status: DC

## 2021-05-18 MED ORDER — SERTRALINE HCL 100 MG PO TABS: 200.0000 mg | ORAL_TABLET | Freq: Every day | ORAL | 1 refills | Status: DC

## 2021-05-18 MED ORDER — TRAZODONE HCL 100 MG PO TABS: ORAL_TABLET | ORAL | 1 refills | Status: DC

## 2021-05-18 NOTE — Progress Notes (Signed)
Dawn King [facility] [id number] August 14, 1991 29 y.o.  Virtual Visit via Telephone Note  I connected with pt on 05/18/21 at  4:30 King EDT by telephone and verified that I am speaking with the correct person using two identifiers.   I discussed the limitations, risks, security and privacy concerns of performing an evaluation and management service by telephone and the availability of in person appointments. I also discussed with the patient that there may be a patient responsible charge related to this service. The patient expressed understanding and agreed to proceed.   I discussed the assessment and treatment plan with the patient. The patient was provided an opportunity to ask questions and all were answered. The patient agreed with the plan and demonstrated an understanding of the instructions.   The patient was advised to call back or seek an in-person evaluation if the symptoms worsen or if the condition fails to improve as anticipated.  I provided 30 minutes of non-face-to-face time during this encounter.  The patient was located at home.  The provider was located at [facility].   Corie Chiquito, PMHNP   Subjective:   Patient ID:  Dawn King is a 30 y.o. (DOB 24-Nov-1990) female.  Chief Complaint:  Chief Complaint  Patient presents with   Follow-up    ADHD, anxiety, depression, and insomnia    HPI Dawn King presents for follow-up of anxiety, depression, ADHD, and insomnia. She reports that Dawn King has been very helpful. She reports that she is much more productive at work and able to make decisions easier. She reports that Dawn King seems to last throughout the day. She takes it at 11 King and it seems to be effective by the time she gets to work at 9 am and it lasts until 6 King.   She reports that her mood has been good overall and reports that her mother has noticed she seems to be happier. She reports that she had some anxiety the first 2  days she took Dawn King and then this resolved. She reports that she has had only one episode of anxiety in response to work stress. She reports that she has been sleeping well. Appetite has been slightly decreased with Dawn King. She reports that she is eat regularly. She reports improved energy and motivation with Dawn King. Denies SI.   She reports that she has had bronchitis over a week.   She reports that she has some work stress and that her roles and responsibilities have been blurred. She recently reached out to get clarification about her work.   Has not needed Alprazolam prn recently.   Past Psychiatric Medication Trials: Concerta- Helpful. Started in 5th grade. Took it sporadically about 5-6 years ago and not since. Does not recall side effects. Took 36 mg po qd. Adderall- Had loss of appetite. May have been XR. Sertraline- Has taken since 8th grade. Has been helpful for mood and anxiety. Paxil- Took prior to 8th grade. Lamictal-Started 5 years ago. Has been helpful for depression and mood lability. Hydroxyzine- allergic reaction Xanax- Intermittently effective Valium- prescribed for muscle spasms    Review of Systems:  Review of Systems  Musculoskeletal:  Negative for gait problem.  Neurological:  Positive for tremors.  Psychiatric/Behavioral:         Please refer to HPI   Medications: I have reviewed the patient's current medications.  Current Outpatient Medications  Medication Sig Dispense Refill   medroxyPROGESTERone Acetate 150 MG/ML SUSY medroxyprogesterone 150 mg/mL intramuscular  syringe  Inject 1 mL every 3 months by intramuscular route.     [START ON 06/15/2021] Methylphenidate HCl ER, King, (Dawn King) 40 MG CP24 Take 40 mg by mouth every evening. 30 capsule 0   [START ON 07/13/2021] Methylphenidate HCl ER, King, (Dawn King) 40 MG CP24 Take 40 mg by mouth every evening. 30 capsule 0   omeprazole (PRILOSEC) 20 MG capsule Take 1 capsule (20 mg total) by mouth daily.  (Patient taking differently: Take 20 mg by mouth as needed.) 30 capsule 0   ALPRAZolam (XANAX) 0.5 MG tablet Take 1 tablet (0.5 mg total) by mouth daily as needed for anxiety. 15 tablet 2   Ferrous Sulfate (IRON PO) Take by mouth. (Patient not taking: Reported on 05/18/2021)     lamoTRIgine (LAMICTAL) 200 MG tablet Take 1 tablet (200 mg total) by mouth at bedtime. 90 tablet 1   Methylphenidate HCl ER, King, (Dawn King) 40 MG CP24 Take 40 mg by mouth every evening. 30 capsule 0   promethazine (PHENERGAN) 25 MG tablet Take 25 mg by mouth every 6 (six) hours as needed for nausea or vomiting. (Patient not taking: No sig reported)     sertraline (ZOLOFT) 100 MG tablet Take 2 tablets (200 mg total) by mouth at bedtime. 180 tablet 1   traZODone (DESYREL) 100 MG tablet Take 1/2-1 tablet po QHS prn insomnia 90 tablet 1   No current facility-administered medications for this visit.    Medication Side Effects: Other: Occ mild tremor  Allergies:  Allergies  Allergen Reactions   Levaquin [Levofloxacin] Anaphylaxis and Rash   Cefaclor Hives and Rash    severe rash   Diphenhydramine Hives   Hydroxyzine Hives   Percocet [Oxycodone-Acetaminophen] Hives    12/19/18: patient reports that she tolerates Oxycodone (but not Percocet)   Sulfonamide Derivatives Hives and Rash    severe rash   Tramadol Nausea And Vomiting   Other Itching    *Cantoloupe* makes mouth really itchy    Sunflower Oil Itching    Mouth really itchy    Clindamycin/Lincomycin Rash   Codeine Rash   Keflex [Cephalexin] Rash   Morphine And Related Rash   Zofran [Ondansetron] Rash    Past Medical History:  Diagnosis Date   Anxiety    Depression    Family history of adverse reaction to anesthesia    maternal aunt had difficulty waken up.    GERD (gastroesophageal reflux disease)    H/O multiple concussions    no residual   Heart murmur    as a child; had a heart defect. outgrew it   Hypothyroidism    no meds for slightlty below  normal    Pelvic pain    Pneumonia last time 2014   PONV (postoperative nausea and vomiting)    with sx age 88 none since   Trauma 09/2010   cracked liver from being "stomped on by a horse".    Family History  Problem Relation Age of Onset   Depression Father    Depression Sister    Anxiety disorder Sister    Depression Paternal Uncle    Autism Cousin    Autism Cousin     Social History   Socioeconomic History   Marital status: Single    Spouse name: Not on file   Number of children: Not on file   Years of education: Not on file   Highest education level: Not on file  Occupational History   Not on file  Tobacco  Use   Smoking status: Former    Years: 3.00    Pack years: 0.00    Types: Cigarettes    Quit date: 08/12/2018    Years since quitting: 2.7   Smokeless tobacco: Never   Tobacco comments:    social  Vaping Use   Vaping Use: Some days   Last attempt to quit: 11/12/2017   Substances: THC  Substance and Sexual Activity   Alcohol use: Yes    Comment: rare   Drug use: Yes    Types: Marijuana    Comment: occ marijuanana last used 07-27-2020   Sexual activity: Not on file  Other Topics Concern   Not on file  Social History Narrative   Not on file   Social Determinants of Health   Financial Resource Strain: Not on file  Food Insecurity: Not on file  Transportation Needs: Not on file  Physical Activity: Not on file  Stress: Not on file  Social Connections: Not on file  Intimate Partner Violence: Not on file    Past Medical History, Surgical history, Social history, and Family history were reviewed and updated as appropriate.   Please see review of systems for further details on the patient's review from today.   Objective:   Physical Exam:  BP 120/80   Wt 271 lb (122.9 kg)   BMI 45.10 kg/m   Physical Exam Neurological:     Mental Status: She is alert and oriented to person, place, and time.     Cranial Nerves: No dysarthria.  Psychiatric:         Attention and Perception: Attention and perception normal.        Mood and Affect: Mood normal.        Speech: Speech normal.        Behavior: Behavior is cooperative.        Thought Content: Thought content normal. Thought content is not paranoid or delusional. Thought content does not include homicidal or suicidal ideation. Thought content does not include homicidal or suicidal plan.        Cognition and Memory: Cognition and memory normal.        Judgment: Judgment normal.     Comments: Insight intact    Lab Review:     Component Value Date/Time   NA 138 08/01/2020 1107   K 4.2 08/01/2020 1107   CL 101 08/01/2020 1107   CO2 26 08/01/2020 1107   GLUCOSE 94 08/01/2020 1107   BUN 12 08/01/2020 1107   CREATININE 0.60 08/01/2020 1107   CALCIUM 8.9 08/01/2020 1107   PROT 7.2 06/19/2019 1224   ALBUMIN 3.2 (L) 06/19/2019 1224   AST 11 (L) 06/19/2019 1224   ALT 11 06/19/2019 1224   ALKPHOS 87 06/19/2019 1224   BILITOT 0.4 06/19/2019 1224   GFRNONAA >60 08/01/2020 1107   GFRAA >60 08/01/2020 1107       Component Value Date/Time   WBC 12.9 (H) 08/01/2020 1107   RBC 5.34 (H) 08/01/2020 1107   HGB 11.5 (L) 08/01/2020 1107   HCT 38.4 08/01/2020 1107   PLT 490 (H) 08/01/2020 1107   MCV 71.9 (L) 08/01/2020 1107   MCH 21.5 (L) 08/01/2020 1107   MCHC 29.9 (L) 08/01/2020 1107   RDW 17.2 (H) 08/01/2020 1107   LYMPHSABS 3.5 08/01/2020 1107   MONOABS 0.7 08/01/2020 1107   EOSABS 0.1 08/01/2020 1107   BASOSABS 0.0 08/01/2020 1107    No results found for: POCLITH, LITHIUM   No results found  for: PHENYTOIN, PHENOBARB, VALPROATE, CBMZ   .res Assessment: Plan:    Continue Dawn King 40 mg daily for attention deficit disorder. Continue sertraline 200 mg daily for anxiety and depression. Continue trazodone 100 mg 1/2 to 1 tablet at bedtime as needed for insomnia. Continue lamotrigine 200 mg daily for mood signs and symptoms. Continue alprazolam 0.5 mg daily as needed for anxiety.   Patient reports that she does not need an additional prescription at this time. Patient to follow-up in 3 months or sooner if clinically indicated. Patient advised to contact office with any questions, adverse effects, or acute worsening in signs and symptoms.   Dawn King was seen today for follow-up.  Diagnoses and all orders for this visit:  Episodic mood disorder (HCC) -     lamoTRIgine (LAMICTAL) 200 MG tablet; Take 1 tablet (200 mg total) by mouth at bedtime. -     sertraline (ZOLOFT) 100 MG tablet; Take 2 tablets (200 mg total) by mouth at bedtime.  Anxiety disorder, unspecified type -     sertraline (ZOLOFT) 100 MG tablet; Take 2 tablets (200 mg total) by mouth at bedtime.  Insomnia, unspecified type -     traZODone (DESYREL) 100 MG tablet; Take 1/2-1 tablet po QHS prn insomnia  Attention deficit hyperactivity disorder (ADHD), predominantly inattentive type -     Methylphenidate HCl ER, King, (Dawn King) 40 MG CP24; Take 40 mg by mouth every evening. -     Methylphenidate HCl ER, King, (Dawn King) 40 MG CP24; Take 40 mg by mouth every evening. -     Methylphenidate HCl ER, King, (Dawn King) 40 MG CP24; Take 40 mg by mouth every evening.   Please see After Visit Summary for patient specific instructions.  No future appointments.  No orders of the defined types were placed in this encounter.     -------------------------------

## 2021-06-01 DIAGNOSIS — Z3042 Encounter for surveillance of injectable contraceptive: Secondary | ICD-10-CM | POA: Diagnosis not present

## 2021-06-24 ENCOUNTER — Other Ambulatory Visit: Payer: Self-pay

## 2021-06-24 ENCOUNTER — Emergency Department (HOSPITAL_BASED_OUTPATIENT_CLINIC_OR_DEPARTMENT_OTHER): Payer: BC Managed Care – PPO | Admitting: Radiology

## 2021-06-24 ENCOUNTER — Emergency Department (HOSPITAL_BASED_OUTPATIENT_CLINIC_OR_DEPARTMENT_OTHER)
Admission: EM | Admit: 2021-06-24 | Discharge: 2021-06-25 | Disposition: A | Discharge status: Home / Self Care | Payer: BC Managed Care – PPO | Attending: Emergency Medicine | Admitting: Emergency Medicine

## 2021-06-24 DIAGNOSIS — Z87891 Personal history of nicotine dependence: Secondary | ICD-10-CM | POA: Insufficient documentation

## 2021-06-24 DIAGNOSIS — R0602 Shortness of breath: Secondary | ICD-10-CM | POA: Insufficient documentation

## 2021-06-24 DIAGNOSIS — E039 Hypothyroidism, unspecified: Secondary | ICD-10-CM | POA: Insufficient documentation

## 2021-06-24 DIAGNOSIS — R0789 Other chest pain: Secondary | ICD-10-CM | POA: Insufficient documentation

## 2021-06-24 DIAGNOSIS — S29012A Strain of muscle and tendon of back wall of thorax, initial encounter: Secondary | ICD-10-CM | POA: Diagnosis not present

## 2021-06-24 DIAGNOSIS — R079 Chest pain, unspecified: Secondary | ICD-10-CM

## 2021-06-24 DIAGNOSIS — M546 Pain in thoracic spine: Secondary | ICD-10-CM | POA: Insufficient documentation

## 2021-06-24 DIAGNOSIS — M545 Low back pain, unspecified: Secondary | ICD-10-CM | POA: Insufficient documentation

## 2021-06-24 DIAGNOSIS — K219 Gastro-esophageal reflux disease without esophagitis: Secondary | ICD-10-CM | POA: Diagnosis not present

## 2021-06-24 LAB — LIPASE, BLOOD: Lipase: 33 U/L (ref 11–51)

## 2021-06-24 LAB — COMPREHENSIVE METABOLIC PANEL
ALT: 15 U/L (ref 0–44)
AST: 12 U/L — ABNORMAL LOW (ref 15–41)
Albumin: 4 g/dL (ref 3.5–5.0)
Alkaline Phosphatase: 88 U/L (ref 38–126)
Anion gap: 9 (ref 5–15)
BUN: 9 mg/dL (ref 6–20)
CO2: 25 mmol/L (ref 22–32)
Calcium: 9.4 mg/dL (ref 8.9–10.3)
Chloride: 103 mmol/L (ref 98–111)
Creatinine, Ser: 0.64 mg/dL (ref 0.44–1.00)
GFR, Estimated: >60 mL/min (ref >60)
Glucose, Bld: 119 mg/dL — ABNORMAL HIGH (ref 70–99)
Potassium: 4 mmol/L (ref 3.5–5.1)
Sodium: 137 mmol/L (ref 135–145)
Total Bilirubin: 0.3 mg/dL (ref 0.3–1.2)
Total Protein: 7.2 g/dL (ref 6.5–8.1)

## 2021-06-24 LAB — PREGNANCY, URINE: Preg Test, Ur: NEGATIVE

## 2021-06-24 MED ORDER — DIAZEPAM 5 MG/ML IJ SOLN
2.5000 mg | Freq: Once | INTRAMUSCULAR | Status: AC
  Administered 2021-06-24: 2.5 mg via INTRAVENOUS
  Filled 2021-06-24: qty 2

## 2021-06-24 MED ORDER — SODIUM CHLORIDE 0.9 % IV BOLUS
1000.0000 mL | Freq: Once | INTRAVENOUS | Status: AC
  Administered 2021-06-24: 1000 mL via INTRAVENOUS

## 2021-06-24 MED ORDER — KETOROLAC TROMETHAMINE 30 MG/ML IJ SOLN
30.0000 mg | Freq: Once | INTRAMUSCULAR | Status: AC
  Administered 2021-06-24: 30 mg via INTRAVENOUS
  Filled 2021-06-24: qty 1

## 2021-06-24 NOTE — ED Notes (Signed)
Patient transported to X-ray 

## 2021-06-24 NOTE — ED Triage Notes (Signed)
Patient here POV from Home with Upper Back Pain, Chest Pain, and SOB.  Patient states today the Patient had an acute onset approximately 4 hours PTA of Upper Back Pain that radiates to the Chest and Upper Torso.  Ambulatory, GCS 15. Patient obviously SOB.

## 2021-06-24 NOTE — ED Provider Notes (Signed)
[facility] EMERGENCY DEPT Provider Note  CSN: [id number] Arrival date & time: 06/24/21 2306  Chief Complaint(s) Shortness of Breath and Back Pain  HPI Dawn King is a 30 y.o. female here with acute, but gradual onset of left mid back pain. Pain radiates around to the anterior chest bilaterally.  Onset/Duration: 3 hr PTA Timing: constant, fluctuating Quality: aching, stabbing Severity: moderate to severe Modifying Factors:  Improved by: being still  Worsened by: moving and breathing Associated Signs/Symptoms:  Pertinent (+): shortness of breath due to pain  Pertinent (-): recent fever or infections, coughing, congestion, N/V/D, abd pain, dysuria. Context: Similar to prior presentations attributed to GERD. She tried taking antiacids w/o relief. Also tried taking 400mg  of Motrin.    The history is provided by the patient.   Past Medical History Past Medical History:  Diagnosis Date   Anxiety    Depression    Family history of adverse reaction to anesthesia    maternal aunt had difficulty waken up.    GERD (gastroesophageal reflux disease)    H/O multiple concussions    no residual   Heart murmur    as a child; had a heart defect. outgrew it   Hypothyroidism    no meds for slightlty below normal    Pelvic pain    Pneumonia last time 2014   PONV (postoperative nausea and vomiting)    with sx age 89 none since   Trauma 09/2010   cracked liver from being "stomped on by a horse".   Patient Active Problem List   Diagnosis Date Noted   Spondylolisthesis 12/19/2018   MONONUCLEOSIS 05/23/2009   LEUKOCYTOSIS 05/23/2009   CARDIAC MURMUR 05/23/2009   PERITONSILLAR ABSCESS 04/28/2009   Home Medication(s) Prior to Admission medications   Medication Sig Start Date End Date Taking? Authorizing Provider  nitrofurantoin, macrocrystal-monohydrate, (MACROBID) 100 MG capsule Take 1 capsule (100 mg total) by mouth 2 (two) times daily. 06/25/21  Yes Mady Oubre,  06/27/21, MD  ALPRAZolam Amadeo Garnet) 0.5 MG tablet Take 1 tablet (0.5 mg total) by mouth daily as needed for anxiety. 04/18/21   06/18/21, PMHNP  Ferrous Sulfate (IRON PO) Take by mouth. Patient not taking: Reported on 05/18/2021    [provider]  lamoTRIgine (LAMICTAL) 200 MG tablet Take 1 tablet (200 mg total) by mouth at bedtime. 05/18/21   07/19/21, PMHNP  medroxyPROGESTERone Acetate 150 MG/ML SUSY medroxyprogesterone 150 mg/mL intramuscular syringe  Inject 1 mL every 3 months by intramuscular route.    [provider]  Methylphenidate HCl ER, PM, (JORNAY PM) 40 MG CP24 Take 40 mg by mouth every evening. 05/18/21   07/19/21, PMHNP  Methylphenidate HCl ER, PM, (JORNAY PM) 40 MG CP24 Take 40 mg by mouth every evening. 06/15/21   08/15/21, PMHNP  Methylphenidate HCl ER, PM, (JORNAY PM) 40 MG CP24 Take 40 mg by mouth every evening. 07/13/21   09/12/21, PMHNP  omeprazole (PRILOSEC) 20 MG capsule Take 1 capsule (20 mg total) by mouth daily. Patient taking differently: Take 20 mg by mouth as needed. 03/10/18   03/12/18, PA-C  promethazine (PHENERGAN) 25 MG tablet Take 25 mg by mouth every 6 (six) hours as needed for nausea or vomiting. Patient not taking: No sig reported    [provider]  sertraline (ZOLOFT) 100 MG tablet Take 2 tablets (200 mg total) by mouth at bedtime. 05/18/21 08/16/21  10/16/21, PMHNP  traZODone (DESYREL) 100 MG tablet Take 1/2-1 tablet po  QHS prn insomnia 05/18/21   Corie Chiquito, PMHNP                                                                                                                                    Past Surgical History Past Surgical History:  Procedure Laterality Date   ADENOIDECTOMY  age 61's   BREAST SURGERY Right 2002   crushed artery from "being stomped on by a horse"...had to have a drain put in to drain blood.    LAPAROSCOPY  08/03/2020   Procedure: LAPAROSCOPY DIAGNOSTIC, ABLATION OF  ENDOMETRIAL IMPLANTS;  Surgeon: Richardean Chimera, MD;  Location: [facility];  Service: Gynecology;;   SPINE SURGERY  12/2019   [place] L 5 to S 1   TONSILLECTOMY  18's   Family History Family History  Problem Relation Age of Onset   Depression Father    Depression Sister    Anxiety disorder Sister    Depression Paternal Uncle    Autism Cousin    Autism Cousin     Social History Social History   Tobacco Use   Smoking status: Former    Years: 3.00    Types: Cigarettes    Quit date: 08/12/2018    Years since quitting: 2.8   Smokeless tobacco: Never   Tobacco comments:    social  Vaping Use   Vaping Use: Some days   Last attempt to quit: 11/12/2017   Substances: THC  Substance Use Topics   Alcohol use: Yes    Comment: rare   Drug use: Yes    Types: Marijuana    Comment: occ marijuanana last used 07-27-2020   Allergies Levaquin [levofloxacin], Cefaclor, Diphenhydramine, Hydroxyzine, Percocet [oxycodone-acetaminophen], Sulfonamide derivatives, Tramadol, Other, Sunflower oil, Clindamycin/lincomycin, Codeine, Keflex [cephalexin], Morphine and related, and Zofran [ondansetron]  Review of Systems Review of Systems All other systems are reviewed and are negative for acute change except as noted in the HPI  Physical Exam Vital Signs  I have reviewed the triage vital signs BP 131/73   Pulse 82   Temp 98.2 F (36.8 C) (Oral)   Resp 20   Ht  (1.651 m)   Wt 117.9 kg   SpO2 98%   BMI 43.27 kg/m   Physical Exam Vitals reviewed.  Constitutional:      General: She is not in acute distress.    Appearance: She is well-developed. She is not diaphoretic.  HENT:     Head: Normocephalic and atraumatic.     Nose: Nose normal.  Eyes:     General: No scleral icterus.       Right eye: No discharge.        Left eye: No discharge.     Conjunctiva/sclera: Conjunctivae normal.     Pupils: Pupils are equal, round, and reactive to light.  Cardiovascular:      Rate and Rhythm: Normal rate and regular rhythm.  Heart sounds: No murmur heard.   No friction rub. No gallop.  Pulmonary:     Effort: Pulmonary effort is normal. No respiratory distress.     Breath sounds: Normal breath sounds. No stridor. No rales.  Chest:     Chest wall: No tenderness.  Abdominal:     General: There is no distension.     Palpations: Abdomen is soft.     Tenderness: There is no abdominal tenderness.  Musculoskeletal:     Cervical back: Normal range of motion and neck supple. No tenderness.     Thoracic back: Spasms and tenderness present.     Lumbar back: Tenderness present.       Back:  Skin:    General: Skin is warm and dry.     Findings: No erythema or rash.  Neurological:     Mental Status: She is alert and oriented to person, place, and time.    ED Results and Treatments Labs (all labs ordered are listed, but only abnormal results are displayed) Labs Reviewed  CBC - Abnormal; Notable for the following components:      Result Value   WBC 12.4 (*)    Hemoglobin 11.0 (*)    HCT 35.4 (*)    MCV 69.5 (*)    MCH 21.6 (*)    RDW 17.9 (*)    Platelets 522 (*)    All other components within normal limits  COMPREHENSIVE METABOLIC PANEL - Abnormal; Notable for the following components:   Glucose, Bld 119 (*)    AST 12 (*)    All other components within normal limits  URINALYSIS, ROUTINE W REFLEX MICROSCOPIC - Abnormal; Notable for the following components:   Hgb urine dipstick MODERATE (*)    Protein, ur TRACE (*)    Leukocytes,Ua MODERATE (*)    Bacteria, UA RARE (*)    All other components within normal limits  LIPASE, BLOOD  PREGNANCY, URINE  D-DIMER, QUANTITATIVE  TROPONIN I (HIGH SENSITIVITY)                                                                                                                         EKG  EKG Interpretation  Date/Time:  Saturday June 24 2021 23:15:21 EDT Ventricular Rate:  89 PR Interval:  155 QRS  Duration: 96 QT Interval:  351 QTC Calculation: 427 R Axis:   55 Text Interpretation: Sinus rhythm Low voltage, precordial leads No significant change since last tracing Confirmed by Drema Pry [phone]) on 06/24/2021 11:40:58 PM       Radiology DG Chest 2 View  Result Date: 06/25/2021 CLINICAL DATA:  Shortness of breath, chest pain EXAM: CHEST - 2 VIEW COMPARISON:  09/15/2013 FINDINGS: Lungs are clear.  No pleural effusion or pneumothorax. The heart is normal in size. Visualized osseous structures are within normal limits. IMPRESSION: Normal chest radiographs. Electronically Signed   By: Charline Bills M.D.   On: 06/25/2021 00:06    Pertinent labs & imaging results  that were available during my care of the patient were reviewed by me and considered in my medical decision making (see MDM for details).  Medications Ordered in ED Medications  ketorolac (TORADOL) 30 MG/ML injection 30 mg (30 mg Intravenous Given 06/24/21 2355)  diazepam (VALIUM) injection 2.5 mg (2.5 mg Intravenous Given 06/24/21 2356)  sodium chloride 0.9 % bolus 1,000 mL (1,000 mLs Intravenous New Bag/Given 06/24/21 2356)  alum & mag hydroxide-simeth (MAALOX/MYLANTA) 200-200-20 MG/5ML suspension 30 mL (30 mLs Oral Given 06/25/21 0022)    And  lidocaine (XYLOCAINE) 2 % viscous mouth solution 15 mL (15 mLs Oral Given 06/25/21 0022)  hyoscyamine (LEVSIN SL) SL tablet 0.25 mg (0.25 mg Sublingual Given 06/25/21 0022)                                                                                                                                     Procedures Procedures  (including critical care time)  Medical Decision Making / ED Course I have reviewed the nursing notes for this encounter and the patient's prior records (if available in EHR or on provided paperwork).  [facility] on [facility]/2021 for the symptoms described in the history of present illness. She was evaluated in  the context of the global COVID-19 pandemic, which necessitated consideration that the patient might be at risk for infection with the SARS-CoV-2 virus that causes COVID-19. Institutional protocols and algorithms that pertain to the evaluation of patients at risk for COVID-19 are in a state of rapid change based on information released by regulatory bodies including the CDC and federal and state organizations. These policies and algorithms were followed during the patient's care in the ED.     Back pain radiating to the chest. Most suspicious for MSK. Will assess for electrolyte derangements contributing to her muscular process. Possible superimposed GERD. Will rule out UTI/pyelo, PE, PTX, PNA. Highly atypical for ACS.  Doubt dissection or esophageal perforation. Doubt upper abd process, but will be assessed in labs.  Treated with Toradol, Valium, and GI cocktail.  Pertinent labs & imaging results that were available during my care of the patient were reviewed by me and considered in my medical decision making:  EKG w/o acute ischemic changes or evidence of pericariditis. Chest x-ray without evidence suggestive of pneumonia, pneumothorax, pneumomediastinum.  No abnormal contour of the mediastinum to suggest dissection. No evidence of acute injuries.  CBC with stable hemoglobin and leukocytosis.  No significant electrolyte derangements or renal sufficiency.  No evidence of bili obstruction or pancreatitis.  UA questionable for possible infection.  Also notable for hematuria.  Patient has had CTs in the last 2 years without evidence of stones. Will treat for possible UTI. Not convinced for pyelo.  Troponin 4 hours after pain started negative.  Given the atypical nature low suspicion for ACS, I do believe that additional cardiac markers are necessary at this time.  Low suspicion for pulmonary embolism and dimer was negative.  Pain improved.    Final Clinical Impression(s) / ED  Diagnoses Final diagnoses:  Chest pain  Acute left-sided thoracic back pain   The patient appears reasonably screened and/or stabilized for discharge and I doubt any other medical condition or other [facility] requiring further screening, evaluation, or treatment in the ED at this time prior to discharge. Safe for discharge with strict return precautions.  Disposition: Discharge  Condition: Good  I have discussed the results, Dx and Tx plan with the patient/family who expressed understanding and agree(s) with the plan. Discharge instructions discussed at length. The patient/family was given strict return precautions who verbalized understanding of the instructions. No further questions at time of discharge.    ED Discharge Orders          Ordered    nitrofurantoin, macrocrystal-monohydrate, (MACROBID) 100 MG capsule  2 times daily        06/25/21 0055             Follow Up: Harvest Forest, MD [address] Raeanne Gathers [place] [address] [phone]  Call  to schedule an appointment for close follow up, if symptoms do not improve or  worsen     This chart was dictated using voice recognition software.  Despite best efforts to proofread,  errors can occur which can change the documentation meaning.    Nira Conn, MD 06/25/21 [phone]

## 2021-06-25 LAB — URINALYSIS, ROUTINE W REFLEX MICROSCOPIC
Bilirubin Urine: NEGATIVE
Glucose, UA: NEGATIVE mg/dL
Ketones, ur: NEGATIVE mg/dL
Nitrite: NEGATIVE
Specific Gravity, Urine: 1.024 (ref 1.005–1.030)
pH: 6.5 (ref 5.0–8.0)

## 2021-06-25 LAB — CBC
HCT: 35.4 % — ABNORMAL LOW (ref 36.0–46.0)
Hemoglobin: 11 g/dL — ABNORMAL LOW (ref 12.0–15.0)
MCH: 21.6 pg — ABNORMAL LOW (ref 26.0–34.0)
MCHC: 31.1 g/dL (ref 30.0–36.0)
MCV: 69.5 fL — ABNORMAL LOW (ref 80.0–100.0)
Platelets: 522 10*3/uL — ABNORMAL HIGH (ref 150–400)
RBC: 5.09 MIL/uL (ref 3.87–5.11)
RDW: 17.9 % — ABNORMAL HIGH (ref 11.5–15.5)
WBC: 12.4 10*3/uL — ABNORMAL HIGH (ref 4.0–10.5)
nRBC: 0 % (ref 0.0–0.2)

## 2021-06-25 LAB — TROPONIN I (HIGH SENSITIVITY): Troponin I (High Sensitivity): <2 ng/L (ref <18)

## 2021-06-25 LAB — D-DIMER, QUANTITATIVE: D-Dimer, Quant: 0.33 ug/mL-FEU (ref 0.00–0.50)

## 2021-06-25 MED ORDER — LIDOCAINE VISCOUS HCL 2 % MT SOLN
15.0000 mL | Freq: Once | OROMUCOSAL | Status: AC
  Administered 2021-06-25: 15 mL via ORAL
  Filled 2021-06-25: qty 15

## 2021-06-25 MED ORDER — NITROFURANTOIN MONOHYD MACRO 100 MG PO CAPS: 100.0000 mg | ORAL_CAPSULE | Freq: Two times a day (BID) | ORAL | 0 refills | Status: DC

## 2021-06-25 MED ORDER — ALUM & MAG HYDROXIDE-SIMETH 200-200-20 MG/5ML PO SUSP
30.0000 mL | Freq: Once | ORAL | Status: AC
  Administered 2021-06-25: 30 mL via ORAL
  Filled 2021-06-25: qty 30

## 2021-06-25 MED ORDER — HYOSCYAMINE SULFATE 0.125 MG SL SUBL
0.2500 mg | SUBLINGUAL_TABLET | Freq: Once | SUBLINGUAL | Status: AC
  Administered 2021-06-25: 0.25 mg via SUBLINGUAL
  Filled 2021-06-25: qty 2

## 2021-06-25 NOTE — Discharge Instructions (Signed)
You may use over-the-counter Motrin (Ibuprofen), Acetaminophen (Tylenol), topical muscle creams such as SalonPas, Icy Hot, Bengay, etc. Please stretch, apply ice or heat (whichever helps), and have massage therapy for additional assistance.  

## 2021-07-14 DIAGNOSIS — U071 COVID-19: Secondary | ICD-10-CM | POA: Diagnosis not present

## 2021-07-14 DIAGNOSIS — J208 Acute bronchitis due to other specified organisms: Secondary | ICD-10-CM | POA: Diagnosis not present

## 2021-08-21 DIAGNOSIS — Z3042 Encounter for surveillance of injectable contraceptive: Secondary | ICD-10-CM | POA: Diagnosis not present

## 2021-09-01 DIAGNOSIS — Z136 Encounter for screening for cardiovascular disorders: Secondary | ICD-10-CM | POA: Diagnosis not present

## 2021-09-01 DIAGNOSIS — D509 Iron deficiency anemia, unspecified: Secondary | ICD-10-CM | POA: Diagnosis not present

## 2021-09-01 DIAGNOSIS — R7303 Prediabetes: Secondary | ICD-10-CM | POA: Diagnosis not present

## 2021-09-01 DIAGNOSIS — Z0001 Encounter for general adult medical examination with abnormal findings: Secondary | ICD-10-CM | POA: Diagnosis not present

## 2021-09-04 DIAGNOSIS — Z01411 Encounter for gynecological examination (general) (routine) with abnormal findings: Secondary | ICD-10-CM | POA: Diagnosis not present

## 2021-09-04 DIAGNOSIS — G43009 Migraine without aura, not intractable, without status migrainosus: Secondary | ICD-10-CM | POA: Diagnosis not present

## 2021-09-04 DIAGNOSIS — M797 Fibromyalgia: Secondary | ICD-10-CM | POA: Diagnosis not present

## 2021-09-04 DIAGNOSIS — Z1239 Encounter for other screening for malignant neoplasm of breast: Secondary | ICD-10-CM | POA: Diagnosis not present

## 2021-09-04 DIAGNOSIS — Z23 Encounter for immunization: Secondary | ICD-10-CM | POA: Diagnosis not present

## 2021-09-04 DIAGNOSIS — Z0001 Encounter for general adult medical examination with abnormal findings: Secondary | ICD-10-CM | POA: Diagnosis not present

## 2021-09-04 DIAGNOSIS — Z6841 Body Mass Index (BMI) 40.0 and over, adult: Secondary | ICD-10-CM | POA: Diagnosis not present

## 2021-09-12 DIAGNOSIS — M797 Fibromyalgia: Secondary | ICD-10-CM | POA: Diagnosis not present

## 2021-09-12 DIAGNOSIS — M545 Low back pain, unspecified: Secondary | ICD-10-CM | POA: Diagnosis not present

## 2021-09-21 DIAGNOSIS — M5416 Radiculopathy, lumbar region: Secondary | ICD-10-CM | POA: Diagnosis not present

## 2021-10-04 DIAGNOSIS — M4317 Spondylolisthesis, lumbosacral region: Secondary | ICD-10-CM | POA: Diagnosis not present

## 2021-10-04 DIAGNOSIS — M4316 Spondylolisthesis, lumbar region: Secondary | ICD-10-CM | POA: Diagnosis not present

## 2021-10-04 DIAGNOSIS — R200 Anesthesia of skin: Secondary | ICD-10-CM | POA: Diagnosis not present

## 2021-10-04 DIAGNOSIS — M5416 Radiculopathy, lumbar region: Secondary | ICD-10-CM | POA: Diagnosis not present

## 2021-10-04 DIAGNOSIS — M545 Low back pain, unspecified: Secondary | ICD-10-CM | POA: Diagnosis not present

## 2021-10-12 DIAGNOSIS — Z6841 Body Mass Index (BMI) 40.0 and over, adult: Secondary | ICD-10-CM | POA: Diagnosis not present

## 2021-10-12 DIAGNOSIS — R030 Elevated blood-pressure reading, without diagnosis of hypertension: Secondary | ICD-10-CM | POA: Diagnosis not present

## 2021-10-12 DIAGNOSIS — M5416 Radiculopathy, lumbar region: Secondary | ICD-10-CM | POA: Diagnosis not present

## 2021-10-20 DIAGNOSIS — Z6841 Body Mass Index (BMI) 40.0 and over, adult: Secondary | ICD-10-CM | POA: Diagnosis not present

## 2021-10-20 DIAGNOSIS — D509 Iron deficiency anemia, unspecified: Secondary | ICD-10-CM | POA: Diagnosis not present

## 2021-10-20 DIAGNOSIS — D75839 Thrombocytosis, unspecified: Secondary | ICD-10-CM | POA: Diagnosis not present

## 2021-10-20 DIAGNOSIS — Z0001 Encounter for general adult medical examination with abnormal findings: Secondary | ICD-10-CM | POA: Diagnosis not present

## 2021-10-20 DIAGNOSIS — M797 Fibromyalgia: Secondary | ICD-10-CM | POA: Diagnosis not present

## 2021-11-08 DIAGNOSIS — Z3042 Encounter for surveillance of injectable contraceptive: Secondary | ICD-10-CM | POA: Diagnosis not present

## 2021-11-23 ENCOUNTER — Other Ambulatory Visit: Payer: Self-pay

## 2021-11-23 ENCOUNTER — Ambulatory Visit (INDEPENDENT_AMBULATORY_CARE_PROVIDER_SITE_OTHER): Payer: BC Managed Care – PPO | Admitting: Psychiatry

## 2021-11-23 ENCOUNTER — Encounter: Payer: Self-pay | Admitting: Psychiatry

## 2021-11-23 DIAGNOSIS — F900 Attention-deficit hyperactivity disorder, predominantly inattentive type: Secondary | ICD-10-CM | POA: Diagnosis not present

## 2021-11-23 DIAGNOSIS — M5416 Radiculopathy, lumbar region: Secondary | ICD-10-CM | POA: Diagnosis not present

## 2021-11-23 DIAGNOSIS — F39 Unspecified mood [affective] disorder: Secondary | ICD-10-CM | POA: Diagnosis not present

## 2021-11-23 DIAGNOSIS — F419 Anxiety disorder, unspecified: Secondary | ICD-10-CM

## 2021-11-23 DIAGNOSIS — G4700 Insomnia, unspecified: Secondary | ICD-10-CM

## 2021-11-23 MED ORDER — SERTRALINE HCL 100 MG PO TABS: 200.0000 mg | ORAL_TABLET | Freq: Every day | ORAL | 1 refills | Status: DC

## 2021-11-23 MED ORDER — JORNAY PM 40 MG PO CP24: 40.0000 mg | ORAL_CAPSULE | Freq: Every evening | ORAL | 0 refills | Status: DC

## 2021-11-23 MED ORDER — LAMOTRIGINE 200 MG PO TABS: 200.0000 mg | ORAL_TABLET | Freq: Every day | ORAL | 1 refills | Status: DC

## 2021-11-23 MED ORDER — TRAZODONE HCL 100 MG PO TABS: ORAL_TABLET | ORAL | 1 refills | Status: DC

## 2021-11-23 NOTE — Progress Notes (Signed)
Dawn King [facility] [id number] 06-Jun-1991 31 y.o.  Subjective:   Patient ID:  Dawn King is a 31 y.o. (DOB 05/28/1991) female.  Chief Complaint:  Chief Complaint  Patient presents with   Follow-up    Anxiety, depression, ADHD, and insomnia    HPI Dawn King presents to the office today for follow-up of anxiety, depression, ADHD, and insomnia. She reports that she has ben staying up later at then feeling tired. She has been taking Trazodone 150 mg po QHS at night. She reports that some nights she keeps thinking of other things she needs to do. She has nights that she falls asleep without difficulty after taking 150 mg dose. She estimates sleeping 8 hours a night. Thinks she will start taking medication at 9 pm.   Mood has been "good." She reports that she had one day of depression and denies identifiable trigger. Anxiety has been "good. Haven't had any issues." She reports that [place] PM remains effective and is more productive at work. She reports that her appetite has been good. "Sometimes I eat to punish myself." Denies SI.   Has not needed Xanax recently.   Currently looking for a new therapist.   Past Psychiatric Medication Trials: Concerta- Helpful. Started in 5th grade. Took it sporadically about 5-6 years ago and not since. Does not recall side effects. Took 36 mg po qd. Adderall- Had loss of appetite. May have been XR. Sertraline- Has taken since 8th grade. Has been helpful for mood and anxiety. Paxil- Took prior to 8th grade. Lamictal-Started 5 years ago. Has been helpful for depression and mood lability. Hydroxyzine- allergic reaction Xanax- Intermittently effective Valium- prescribed for muscle spasms   Flowsheet Row ED from 06/24/2021 in [facility] ED from 11/07/2018 in [place] DEPT  C-SSRS RISK CATEGORY No Risk No Risk        Review of Systems:  Review of Systems   Constitutional:  Positive for fatigue.  Cardiovascular:  Negative for palpitations.  Musculoskeletal:  Negative for gait problem.  Skin:        She reports increased sensitivity to touch  Hematological:        Referred to hematology. Has had chronically low iron. Also elevated WBC at times.  Psychiatric/Behavioral:         Please refer to HPI   Medications: I have reviewed the patient's current medications.  Current Outpatient Medications  Medication Sig Dispense Refill   ALPRAZolam (XANAX) 0.5 MG tablet Take 1 tablet (0.5 mg total) by mouth daily as needed for anxiety. 15 tablet 2   medroxyPROGESTERone Acetate 150 MG/ML SUSY medroxyprogesterone 150 mg/mL intramuscular syringe  Inject 1 mL every 3 months by intramuscular route.     omeprazole (PRILOSEC) 20 MG capsule Take 1 capsule (20 mg total) by mouth daily. (Patient taking differently: Take 20 mg by mouth as needed.) 30 capsule 0   promethazine (PHENERGAN) 25 MG tablet Take 25 mg by mouth every 6 (six) hours as needed for nausea or vomiting.     Ferrous Sulfate (IRON PO) Take by mouth. (Patient not taking: Reported on 05/18/2021)     lamoTRIgine (LAMICTAL) 200 MG tablet Take 1 tablet (200 mg total) by mouth at bedtime. 90 tablet 1   [START ON 01/18/2022] Methylphenidate HCl ER, PM, (JORNAY PM) 40 MG CP24 Take 40 mg by mouth every evening. 30 capsule 0   [START ON 12/21/2021] Methylphenidate HCl ER, PM, (JORNAY PM) 40 MG CP24 Take 40 mg by  mouth every evening. 30 capsule 0   Methylphenidate HCl ER, PM, (JORNAY PM) 40 MG CP24 Take 40 mg by mouth every evening. 30 capsule 0   sertraline (ZOLOFT) 100 MG tablet Take 2 tablets (200 mg total) by mouth at bedtime. 180 tablet 1   SUMAtriptan (IMITREX) 50 MG tablet sumatriptan 50 mg tablet     traZODone (DESYREL) 100 MG tablet Take 1-1.5 tablets po QHS prn insomnia 145 tablet 1   No current facility-administered medications for this visit.    Medication Side Effects: None  Allergies:   Allergies  Allergen Reactions   Levaquin [Levofloxacin] Anaphylaxis and Rash   Cefaclor Hives and Rash    severe rash   Diphenhydramine Hives   Hydroxyzine Hives   Percocet [Oxycodone-Acetaminophen] Hives    12/19/18: patient reports that she tolerates Oxycodone (but not Percocet)   Sulfonamide Derivatives Hives and Rash    severe rash   Tramadol Nausea And Vomiting   Other Itching    *Cantoloupe* makes mouth really itchy    Sunflower Oil Itching    Mouth really itchy    Clindamycin/Lincomycin Rash   Codeine Rash   Keflex [Cephalexin] Rash   Morphine And Related Rash   Zofran [Ondansetron] Rash    Past Medical History:  Diagnosis Date   Anxiety    Depression    Family history of adverse reaction to anesthesia    maternal aunt had difficulty waken up.    Fibromyalgia    GERD (gastroesophageal reflux disease)    H/O multiple concussions    no residual   Heart murmur    as a child; had a heart defect. outgrew it   Hypothyroidism    no meds for slightlty below normal    Pelvic pain    Pneumonia last time 2014   PONV (postoperative nausea and vomiting)    with sx age 43 none since   Trauma 09/2010   cracked liver from being "stomped on by a horse".    Past Medical History, Surgical history, Social history, and Family history were reviewed and updated as appropriate.   Please see review of systems for further details on the patient's review from today.   Objective:   Physical Exam:  BP 124/72    Pulse 78   Physical Exam Constitutional:      General: She is not in acute distress. Musculoskeletal:        General: No deformity.  Neurological:     Mental Status: She is alert and oriented to person, place, and time.     Coordination: Coordination normal.  Psychiatric:        Attention and Perception: Attention and perception normal. She does not perceive auditory or visual hallucinations.        Mood and Affect: Mood normal. Mood is not anxious or depressed.  Affect is not labile, blunt, angry or inappropriate.        Speech: Speech normal.        Behavior: Behavior normal.        Thought Content: Thought content normal. Thought content is not paranoid or delusional. Thought content does not include homicidal or suicidal ideation. Thought content does not include homicidal or suicidal plan.        Cognition and Memory: Cognition and memory normal.        Judgment: Judgment normal.     Comments: Insight intact    Lab Review:     Component Value Date/Time   NA 137 06/24/2021 2339  K 4.0 06/24/2021 2339   CL 103 06/24/2021 2339   CO2 25 06/24/2021 2339   GLUCOSE 119 (H) 06/24/2021 2339   BUN 9 06/24/2021 2339   CREATININE 0.64 06/24/2021 2339   CALCIUM 9.4 06/24/2021 2339   PROT 7.2 06/24/2021 2339   ALBUMIN 4.0 06/24/2021 2339   AST 12 (L) 06/24/2021 2339   ALT 15 06/24/2021 2339   ALKPHOS 88 06/24/2021 2339   BILITOT 0.3 06/24/2021 2339   GFRNONAA >60 06/24/2021 2339   GFRAA >60 08/01/2020 1107       Component Value Date/Time   WBC 12.4 (H) 06/24/2021 2339   RBC 5.09 06/24/2021 2339   HGB 11.0 (L) 06/24/2021 2339   HCT 35.4 (L) 06/24/2021 2339   PLT 522 (H) 06/24/2021 2339   MCV 69.5 (L) 06/24/2021 2339   MCH 21.6 (L) 06/24/2021 2339   MCHC 31.1 06/24/2021 2339   RDW 17.9 (H) 06/24/2021 2339   LYMPHSABS 3.5 08/01/2020 1107   MONOABS 0.7 08/01/2020 1107   EOSABS 0.1 08/01/2020 1107   BASOSABS 0.0 08/01/2020 1107    No results found for: POCLITH, LITHIUM   No results found for: PHENYTOIN, PHENOBARB, VALPROATE, CBMZ   .res Assessment: Plan:    Will increase Trazodone to 100-150 mg at bedtime since she reports that the 150 mg dose has been most effective for her insomnia.  Continue Lamictal 200 mg po qd for mood s/s.  Continue Sertraline 200 mg po qd for mood and anxiety. Continue Jornay PM 40 mg every evening.  Continue Alprazolam prn anxiety. She reports that she does not need a prescription at this time.  Pt to  follow-up in 3 months or sooner if clinically indicated.  Patient advised to contact office with any questions, adverse effects, or acute worsening in signs and symptoms.   Aryssa was seen today for follow-up.  Diagnoses and all orders for this visit:  Episodic mood disorder (HCC) -     lamoTRIgine (LAMICTAL) 200 MG tablet; Take 1 tablet (200 mg total) by mouth at bedtime. -     sertraline (ZOLOFT) 100 MG tablet; Take 2 tablets (200 mg total) by mouth at bedtime.  Attention deficit hyperactivity disorder (ADHD), predominantly inattentive type -     Methylphenidate HCl ER, PM, (JORNAY PM) 40 MG CP24; Take 40 mg by mouth every evening. -     Methylphenidate HCl ER, PM, (JORNAY PM) 40 MG CP24; Take 40 mg by mouth every evening. -     Methylphenidate HCl ER, PM, (JORNAY PM) 40 MG CP24; Take 40 mg by mouth every evening.  Insomnia, unspecified type -     traZODone (DESYREL) 100 MG tablet; Take 1-1.5 tablets po QHS prn insomnia  Anxiety disorder, unspecified type -     sertraline (ZOLOFT) 100 MG tablet; Take 2 tablets (200 mg total) by mouth at bedtime.     Please see After Visit Summary for patient specific instructions.  Future Appointments  Date Time Provider [place]  03/01/2022 11:00 AM Thayer Headings, PMHNP CP-CP None    No orders of the defined types were placed in this encounter.   -------------------------------

## 2021-11-27 ENCOUNTER — Other Ambulatory Visit: Payer: Self-pay | Admitting: *Deleted

## 2021-11-27 DIAGNOSIS — D509 Iron deficiency anemia, unspecified: Secondary | ICD-10-CM

## 2021-11-27 NOTE — Progress Notes (Signed)
New patient appt on 01/01/22 and labs. Orders entered for labs

## 2021-12-29 NOTE — Progress Notes (Addendum)
New Hematology/Oncology Consult   Requesting MD: Dr. Latanya Presser  [phone]  Reason for Consult: Iron deficiency anemia  HPI: Dawn King has been referred for evaluation of iron deficiency anemia.  CBC from 09/01/2021 returned with a hemoglobin of 10.8, MCV 69, RDW 16, white count 9.5, platelet count 516,000; total iron low at 29, TIBC normal 307, percent saturation low at 9, ferritin normal 20 (no reference ranges with the lab work, abnormals listed as low or high).  Labs from 10/20/2021: Hemoglobin 10.9, MCV 71, RDW 17, platelet count 460,000, ferritin 21 (16-145), iron 26 (40-190), TIBC 306 (250-450), % saturation 8 (16-45%).  Hemoglobin electrophoresis negative for beta thalassemia.  Alpha thalassemia testing negative for the 7 most common deletions.  She reports a history of iron deficiency dating to late teen years.  She began Depo-Provera in early 2022 and has not had a menstrual cycle since.  No other bleeding, specifically no bloody or black stools, no hematuria, no epistaxis, no gingival bleeding.  She has been taking iron supplements "off-and-on".  She tolerates the iron supplements in general without difficulty.  She reports she completed stool cards in January.  Past Medical History:  Diagnosis Date   Anxiety    Depression    Family history of adverse reaction to anesthesia    maternal aunt had difficulty waken up.    Fibromyalgia    GERD (gastroesophageal reflux disease)    H/O multiple concussions    no residual   Heart murmur    as a child; had a heart defect. outgrew it   Hypothyroidism    no meds for slightlty below normal    Pelvic pain    Pneumonia last time 2014   PONV (postoperative nausea and vomiting)    with sx age 28 none since   Trauma 09/2010   cracked liver from being "stomped on by a horse".  :   Past Surgical History:  Procedure Laterality Date   ADENOIDECTOMY  age 54's   BREAST SURGERY Right 2002   crushed artery from "being stomped on  by a horse"...had to have a drain put in to drain blood.    LAPAROSCOPY  08/03/2020   Procedure: LAPAROSCOPY DIAGNOSTIC, ABLATION OF ENDOMETRIAL IMPLANTS;  Surgeon: Arvella Nigh, MD;  Location: [facility];  Service: Gynecology;;   SPINE SURGERY  12/2019    L 5 to S 1   TONSILLECTOMY  18's     Current Outpatient Medications:    ALPRAZolam (XANAX) 0.5 MG tablet, Take 1 tablet (0.5 mg total) by mouth daily as needed for anxiety., Disp: 15 tablet, Rfl: 2   medroxyPROGESTERone Acetate 150 MG/ML SUSY, medroxyprogesterone 150 mg/mL intramuscular syringe  Inject 1 mL every 3 months by intramuscular route., Disp: , Rfl:    [START ON 01/18/2022] Methylphenidate HCl ER, PM, (JORNAY PM) 40 MG CP24, Take 40 mg by mouth every evening., Disp: 30 capsule, Rfl: 0   Methylphenidate HCl ER, PM, (JORNAY PM) 40 MG CP24, Take 40 mg by mouth every evening., Disp: 30 capsule, Rfl: 0   Methylphenidate HCl ER, PM, (JORNAY PM) 40 MG CP24, Take 40 mg by mouth every evening., Disp: 30 capsule, Rfl: 0   promethazine (PHENERGAN) 25 MG tablet, Take 25 mg by mouth every 6 (six) hours as needed for nausea or vomiting., Disp: , Rfl:    sertraline (ZOLOFT) 100 MG tablet, Take 2 tablets (200 mg total) by mouth at bedtime., Disp: 180 tablet, Rfl: 1   SUMAtriptan (IMITREX) 50  MG tablet, sumatriptan 50 mg tablet, Disp: , Rfl:    traZODone (DESYREL) 100 MG tablet, Take 1-1.5 tablets po QHS prn insomnia, Disp: 145 tablet, Rfl: 1   Ferrous Sulfate (IRON PO), Take by mouth. (Patient not taking: Reported on 05/18/2021), Disp: , Rfl:    lamoTRIgine (LAMICTAL) 200 MG tablet, Take 1 tablet (200 mg total) by mouth at bedtime. (Patient not taking: Reported on 01/01/2022), Disp: 90 tablet, Rfl: 1   omeprazole (PRILOSEC) 20 MG capsule, Take 1 capsule (20 mg total) by mouth daily. (Patient not taking: Reported on 01/01/2022), Disp: 30 capsule, Rfl: 0:    Allergies  Allergen Reactions   Levaquin [Levofloxacin] Anaphylaxis  and Rash   Cefaclor Hives and Rash    severe rash   Diphenhydramine Hives   Hydroxyzine Hives   Percocet [Oxycodone-Acetaminophen] Hives    12/19/18: patient reports that she tolerates Oxycodone (but not Percocet)   Sulfonamide Derivatives Hives and Rash    severe rash   Tramadol Nausea And Vomiting   Other Itching    *Cantoloupe* makes mouth really itchy    Sunflower Oil Itching    Mouth really itchy    Clindamycin/Lincomycin Rash   Codeine Rash   Keflex [Cephalexin] Rash   Morphine And Related Rash   Zofran [Ondansetron] Rash    FH: Father, paternal uncle, paternal grandfather with prostate cancer.  Paternal grandfather also had colorectal cancer in his 37s.  Paternal grandmother with breast cancer.  Maternal aunt with breast cancer.  SOCIAL HISTORY: She lives in [place] with her mother and stepfather.  She works at the [employer].  She occasionally vapes.  EtOH use reported as occasional.  Review of Systems: She has a good appetite.  No weight loss.  She reports chronic back pain.  Single episode of right-sided abdominal pain earlier today.  She denies bleeding.  She eats a regular diet.  She craves ice.  Nails are brittle.  No soreness over the tongue.  She has an occasional cough.  Mild dyspnea on exertion which she relates to "being out of shape".  Last few months she has had diarrhea.  She vomits periodically, mostly in the morning hours.  No numbness or tingling in the hands or feet.  Physical Exam:  Blood pressure (!) 133/91, pulse 91, temperature 98.1 F (36.7 C), temperature source Oral, resp. rate 18, height 5\' 5"  (1.651 m), weight 280 lb 9.6 oz (127.3 kg), SpO2 99 %.  HEENT: Geographic tongue. Lungs: Lungs clear bilaterally. Cardiac: Regular rate and rhythm. Abdomen: Soft and nontender.  No hepatosplenomegaly. Vascular: No leg edema. Lymph nodes: No palpable cervical, supraclavicular, axillary or inguinal lymph nodes. Neurologic: Alert and oriented.   Gait normal. Skin: No rash.  LABS:   Recent Labs    01/01/22 1400  WBC 11.5*  HGB 11.0*  HCT 35.9*  PLT 465*   Peripheral blood smear-moderate variation in red cell size, few to moderate ovalocytes, rare teardrop, rare target, mild increase in polychromasia; platelets appear normal in number; white blood cell morphology unremarkable, rare 5 lobed neutrophil   RADIOLOGY:  No results found.  Assessment and Plan:   Microcytic anemia 09/01/2021 hemoglobin 10.8, MCV 69, RDW 16, white count 9.5, platelet count 516,000, ferritin 20 10/20/2021 hemoglobin 10.9, MCV 71, RDW 17, platelet count 460,000, ferritin 21 10/20/2021 hemoglobin electrophoresis negative for beta thalassemia, alpha thalassemia testing negative for the 7 most common deletion Per patient report stool cards submitted for fecal occult blood testing January 2023--fecal occult blood negative  on 11/27/2021 Multiple family members with malignancy-patient reports genetics referral completed with OB/GYN Change in bowel habits 2 months ago with diarrhea Insomnia Anxiety/depression Fibromyalgia Endometriosis  Dawn King  was referred for evaluation of iron deficiency anemia.  She has a mild microcytic anemia, ferritin is in low normal range.  Indices and peripheral blood smear are most consistent with iron deficiency.  This is likely due to her menstrual cycle though she has not had a consistent menstrual cycle for the past year.  We will defer further evaluation for a source of blood loss to her PCP.  She will begin ferrous sulfate 325 mg twice daily.  She understands to notify the office if she is unable to tolerate oral iron.  She has a family history of multiple family members with malignancy.  We discussed a referral to the genetics counselor.  She reports she has completed this with her OB/GYN.  She will discuss the change in bowel habits with her PCP.  She will return for lab and follow-up in 8 to 10 weeks.  Patient  seen with Dr. Benay Spice.  Ned Card, NP 01/01/2022, 3:49 PM   This was a shared visit with Ned Card.  Dawn King was interviewed and examined.  I reviewed the peripheral blood smear.  She is referred for evaluation of microcytic anemia.  The peripheral blood smear and hematologic indices are most consistent with iron deficiency anemia. The most likely source for blood loss in her case is menses, but she reports being amenorrheic since beginning medroxyprogesterone last year.  We checked a soluble transferrin receptor today.  She will begin ferrous sulfate.  She will return for an office visit and repeat CBC in 2 months.  We will defer further diagnostic evaluation for a source of blood loss to Dr. Maia Petties.  I was present for greater than 50% of today's visit.  I performed medical decision making.  Julieanne Manson, MD

## 2022-01-01 ENCOUNTER — Other Ambulatory Visit (HOSPITAL_BASED_OUTPATIENT_CLINIC_OR_DEPARTMENT_OTHER): Payer: Self-pay

## 2022-01-01 ENCOUNTER — Other Ambulatory Visit: Payer: Self-pay

## 2022-01-01 ENCOUNTER — Inpatient Hospital Stay: Payer: BC Managed Care – PPO | Attending: Nurse Practitioner | Admitting: Nurse Practitioner

## 2022-01-01 ENCOUNTER — Inpatient Hospital Stay: Payer: BC Managed Care – PPO

## 2022-01-01 ENCOUNTER — Telehealth: Payer: Self-pay

## 2022-01-01 VITALS — BP 133/91 | HR 91 | Temp 98.1°F | Resp 18 | Ht 65.0 in | Wt 280.6 lb

## 2022-01-01 DIAGNOSIS — N809 Endometriosis, unspecified: Secondary | ICD-10-CM | POA: Insufficient documentation

## 2022-01-01 DIAGNOSIS — F419 Anxiety disorder, unspecified: Secondary | ICD-10-CM | POA: Insufficient documentation

## 2022-01-01 DIAGNOSIS — Z800 Family history of malignant neoplasm of digestive organs: Secondary | ICD-10-CM | POA: Insufficient documentation

## 2022-01-01 DIAGNOSIS — Z79899 Other long term (current) drug therapy: Secondary | ICD-10-CM | POA: Diagnosis not present

## 2022-01-01 DIAGNOSIS — D509 Iron deficiency anemia, unspecified: Secondary | ICD-10-CM | POA: Diagnosis not present

## 2022-01-01 DIAGNOSIS — Z885 Allergy status to narcotic agent status: Secondary | ICD-10-CM | POA: Insufficient documentation

## 2022-01-01 DIAGNOSIS — R197 Diarrhea, unspecified: Secondary | ICD-10-CM | POA: Diagnosis not present

## 2022-01-01 DIAGNOSIS — G4700 Insomnia, unspecified: Secondary | ICD-10-CM | POA: Diagnosis not present

## 2022-01-01 DIAGNOSIS — M797 Fibromyalgia: Secondary | ICD-10-CM | POA: Insufficient documentation

## 2022-01-01 DIAGNOSIS — Z803 Family history of malignant neoplasm of breast: Secondary | ICD-10-CM | POA: Insufficient documentation

## 2022-01-01 DIAGNOSIS — Z881 Allergy status to other antibiotic agents status: Secondary | ICD-10-CM | POA: Insufficient documentation

## 2022-01-01 DIAGNOSIS — Z882 Allergy status to sulfonamides status: Secondary | ICD-10-CM | POA: Insufficient documentation

## 2022-01-01 DIAGNOSIS — Z888 Allergy status to other drugs, medicaments and biological substances status: Secondary | ICD-10-CM | POA: Diagnosis not present

## 2022-01-01 DIAGNOSIS — Z8042 Family history of malignant neoplasm of prostate: Secondary | ICD-10-CM | POA: Diagnosis not present

## 2022-01-01 DIAGNOSIS — F32A Depression, unspecified: Secondary | ICD-10-CM | POA: Insufficient documentation

## 2022-01-01 LAB — CBC WITH DIFFERENTIAL (CANCER CENTER ONLY)
Abs Immature Granulocytes: 0.04 10*3/uL (ref 0.00–0.07)
Basophils Absolute: 0 10*3/uL (ref 0.0–0.1)
Basophils Relative: 0 %
Eosinophils Absolute: 0 10*3/uL (ref 0.0–0.5)
Eosinophils Relative: 0 %
HCT: 35.9 % — ABNORMAL LOW (ref 36.0–46.0)
Hemoglobin: 11 g/dL — ABNORMAL LOW (ref 12.0–15.0)
Immature Granulocytes: 0 %
Lymphocytes Relative: 23 %
Lymphs Abs: 2.6 10*3/uL (ref 0.7–4.0)
MCH: 21.4 pg — ABNORMAL LOW (ref 26.0–34.0)
MCHC: 30.6 g/dL (ref 30.0–36.0)
MCV: 70 fL — ABNORMAL LOW (ref 80.0–100.0)
Monocytes Absolute: 0.5 10*3/uL (ref 0.1–1.0)
Monocytes Relative: 4 %
Neutro Abs: 8.3 10*3/uL — ABNORMAL HIGH (ref 1.7–7.7)
Neutrophils Relative %: 73 %
Platelet Count: 465 10*3/uL — ABNORMAL HIGH (ref 150–400)
RBC: 5.13 MIL/uL — ABNORMAL HIGH (ref 3.87–5.11)
RDW: 17.2 % — ABNORMAL HIGH (ref 11.5–15.5)
WBC Count: 11.5 10*3/uL — ABNORMAL HIGH (ref 4.0–10.5)
nRBC: 0 % (ref 0.0–0.2)

## 2022-01-01 LAB — URINALYSIS, COMPLETE (UACMP) WITH MICROSCOPIC
Bilirubin Urine: NEGATIVE
Glucose, UA: NEGATIVE mg/dL
Ketones, ur: NEGATIVE mg/dL
Nitrite: NEGATIVE
Specific Gravity, Urine: 1.028 (ref 1.005–1.030)
pH: 5.5 (ref 5.0–8.0)

## 2022-01-01 LAB — SAVE SMEAR(SSMR), FOR PROVIDER SLIDE REVIEW

## 2022-01-01 NOTE — Telephone Encounter (Signed)
-  Called the patient PCP and spoke with Pam and request  result of the stool card to be fax at [phone]. Patient stated she turn in the stool card in January.

## 2022-01-02 ENCOUNTER — Telehealth: Payer: Self-pay

## 2022-01-02 LAB — VITAMIN B12: Vitamin B-12: 120 pg/mL — ABNORMAL LOW (ref 180–914)

## 2022-01-02 NOTE — Telephone Encounter (Signed)
-----   Message from Rana Snare, NP sent at 01/01/2022  4:53 PM EST ----- Please let her know the peripheral blood smear is consistent with iron deficiency.  Begin oral iron as we talked about.  Follow-up as scheduled.

## 2022-01-02 NOTE — Telephone Encounter (Signed)
Called and spoke with the patient to let her know the peripheral blood smear is consistent with iron deficiency. To begin oral iron  and follow-up as scheduled. Patient voice understanding of instruction and had no further questions or concerns at this time.

## 2022-01-03 ENCOUNTER — Telehealth: Payer: Self-pay

## 2022-01-03 NOTE — Telephone Encounter (Signed)
-----   Message from Rana Snare, NP sent at 01/02/2022  4:43 PM EST ----- Please let her know B12 level returned low. She should begin OTC B12 1000 mcg daily.

## 2022-01-03 NOTE — Telephone Encounter (Signed)
Called and spoke with the patient to let her know B12 level returned low, and she should begin OTC B12 1000 mcg daily. Patient voiced understanding of instruction and had no further questions or concerns at this time.

## 2022-01-04 ENCOUNTER — Other Ambulatory Visit: Payer: Self-pay

## 2022-01-04 DIAGNOSIS — F419 Anxiety disorder, unspecified: Secondary | ICD-10-CM

## 2022-01-04 LAB — SOLUBLE TRANSFERRIN RECEPTOR: Transferrin Receptor: 23.4 nmol/L (ref 12.2–27.3)

## 2022-01-04 MED ORDER — ALPRAZOLAM 0.5 MG PO TABS: 0.5000 mg | ORAL_TABLET | Freq: Every day | ORAL | 2 refills | Status: DC | PRN

## 2022-01-25 DIAGNOSIS — Z3042 Encounter for surveillance of injectable contraceptive: Secondary | ICD-10-CM | POA: Diagnosis not present

## 2022-01-29 ENCOUNTER — Other Ambulatory Visit: Payer: Self-pay | Admitting: Family Medicine

## 2022-01-29 DIAGNOSIS — R300 Dysuria: Secondary | ICD-10-CM | POA: Diagnosis not present

## 2022-01-29 DIAGNOSIS — N946 Dysmenorrhea, unspecified: Secondary | ICD-10-CM | POA: Diagnosis not present

## 2022-01-29 DIAGNOSIS — N939 Abnormal uterine and vaginal bleeding, unspecified: Secondary | ICD-10-CM

## 2022-01-29 DIAGNOSIS — R3915 Urgency of urination: Secondary | ICD-10-CM | POA: Diagnosis not present

## 2022-01-29 DIAGNOSIS — R32 Unspecified urinary incontinence: Secondary | ICD-10-CM | POA: Diagnosis not present

## 2022-01-30 DIAGNOSIS — R5383 Other fatigue: Secondary | ICD-10-CM | POA: Diagnosis not present

## 2022-01-30 DIAGNOSIS — N920 Excessive and frequent menstruation with regular cycle: Secondary | ICD-10-CM | POA: Diagnosis not present

## 2022-02-05 ENCOUNTER — Other Ambulatory Visit: Payer: Self-pay

## 2022-02-05 ENCOUNTER — Ambulatory Visit
Admission: RE | Admit: 2022-02-05 | Discharge: 2022-02-05 | Disposition: A | Discharge status: Home / Self Care | Payer: BC Managed Care – PPO | Source: Ambulatory Visit | Attending: Family Medicine | Admitting: Family Medicine

## 2022-02-05 DIAGNOSIS — R112 Nausea with vomiting, unspecified: Secondary | ICD-10-CM | POA: Diagnosis not present

## 2022-02-05 DIAGNOSIS — N939 Abnormal uterine and vaginal bleeding, unspecified: Secondary | ICD-10-CM | POA: Diagnosis not present

## 2022-02-05 DIAGNOSIS — N924 Excessive bleeding in the premenopausal period: Secondary | ICD-10-CM | POA: Diagnosis not present

## 2022-03-01 ENCOUNTER — Other Ambulatory Visit: Payer: BC Managed Care – PPO

## 2022-03-01 ENCOUNTER — Ambulatory Visit: Payer: BC Managed Care – PPO | Admitting: Oncology

## 2022-03-01 ENCOUNTER — Encounter: Payer: Self-pay | Admitting: Psychiatry

## 2022-03-01 ENCOUNTER — Ambulatory Visit (INDEPENDENT_AMBULATORY_CARE_PROVIDER_SITE_OTHER): Payer: BC Managed Care – PPO | Admitting: Psychiatry

## 2022-03-01 DIAGNOSIS — F419 Anxiety disorder, unspecified: Secondary | ICD-10-CM

## 2022-03-01 DIAGNOSIS — G4700 Insomnia, unspecified: Secondary | ICD-10-CM

## 2022-03-01 DIAGNOSIS — F39 Unspecified mood [affective] disorder: Secondary | ICD-10-CM | POA: Diagnosis not present

## 2022-03-01 MED ORDER — TRAZODONE HCL 100 MG PO TABS: ORAL_TABLET | ORAL | 1 refills | Status: DC

## 2022-03-01 MED ORDER — LAMOTRIGINE 200 MG PO TABS: 200.0000 mg | ORAL_TABLET | Freq: Every day | ORAL | 1 refills | Status: DC

## 2022-03-01 MED ORDER — SERTRALINE HCL 100 MG PO TABS: 200.0000 mg | ORAL_TABLET | Freq: Every day | ORAL | 1 refills | Status: DC

## 2022-03-01 NOTE — Progress Notes (Signed)
Dawn King [facility] ?[id number] ?1990-12-20 ?31 y.o. ? ?Subjective:  ? ?Patient ID:  Dawn King is a 31 y.o. (DOB Dec 13, 1990) female. ? ?Chief Complaint:  ?Chief Complaint  ?Patient presents with  ? Follow-up  ?  Anxiety, depression, ADHD, and insomnia  ? ? ?HPI ?Dawn [facility] presents to the office today for follow-up of anxiety, depression, ADHD, and insomnia. She reports, "I'm doing great." She reports that her motivation has been good. Energy has improved. She reports that she has been productive at work. Mood has been good. Denies depression other than when she had severe menstrual bleeding. She noticed some anxiety for a couple of days and took Xanax prn then and anxiety resolved. She reports that she is now walking 2 miles a day. Concentration has been good. Appetite has been different the last 2 weeks. Sleeping well and will adjust Trazodone based on the night. Denies SI.  ? 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  ? ?Past Psychiatric Medication Trials: ?Concerta- Helpful. Started in 5th grade. Took it sporadically about 5-6 years ago and not since. Does not recall side effects. Took 36 mg po qd. ?Adderall- Had loss of appetite. May have been XR. ?Sertraline- Has taken since 8th grade. Has been helpful for mood and anxiety. ?Paxil- Took prior to 8th grade. ?Lamictal-Started 5 years ago. Has been helpful for depression and mood lability. ?Hydroxyzine- allergic reaction ?Xanax- Intermittently effective ?Valium- prescribed for muscle spasms ?  ? ?Flowsheet Row ED from 06/24/2021 in [facility] ED from 11/07/2018 in [facility]  ?C-SSRS RISK CATEGORY No Risk No Risk  ? ?  ?  ? ?Review of Systems:  ?Review of Systems  ?Genitourinary:   ?     Recent heavy bleeding  ?Musculoskeletal:  Negative for gait problem.  ?Psychiatric/Behavioral:    ?     Please refer to HPI  ? ?Medications: I have reviewed the patient's current  medications. ? ?Current Outpatient Medications  ?Medication Sig Dispense Refill  ? ALPRAZolam (XANAX) 0.5 MG tablet Take 1 tablet (0.5 mg total) by mouth daily as needed for anxiety. 15 tablet 2  ? Cyanocobalamin (VITAMIN B 12 PO) Take by mouth.    ? ferrous sulfate 325 (65 FE) MG tablet Take 325 mg by mouth daily with breakfast.    ? medroxyPROGESTERone Acetate 150 MG/ML SUSY medroxyprogesterone 150 mg/mL intramuscular syringe ? Inject 1 mL every 3 months by intramuscular route.    ? Methylphenidate HCl ER, PM, (JORNAY PM) 40 MG CP24 Take 40 mg by mouth every evening. 30 capsule 0  ? promethazine (PHENERGAN) 25 MG tablet Take 25 mg by mouth every 6 (six) hours as needed for nausea or vomiting.    ? SUMAtriptan (IMITREX) 50 MG tablet sumatriptan 50 mg tablet    ? Ferrous Sulfate (IRON PO) Take by mouth. (Patient not taking: Reported on 05/18/2021)    ? lamoTRIgine (LAMICTAL) 200 MG tablet Take 1 tablet (200 mg total) by mouth at bedtime. 90 tablet 1  ? Methylphenidate HCl ER, PM, (JORNAY PM) 40 MG CP24 Take 40 mg by mouth every evening. 30 capsule 0  ? Methylphenidate HCl ER, PM, (JORNAY PM) 40 MG CP24 Take 40 mg by mouth every evening. 30 capsule 0  ? omeprazole (PRILOSEC) 20 MG capsule Take 1 capsule (20 mg total) by mouth daily. (Patient not taking: Reported on 01/01/2022) 30 capsule 0  ? sertraline (ZOLOFT) 100 MG tablet Take 2 tablets (200 mg total)  by mouth at bedtime. 180 tablet 1  ? traZODone (DESYREL) 100 MG tablet Take 1-1.5 tablets po QHS prn insomnia 145 tablet 1  ? ?No current facility-administered medications for this visit.  ? ? ?Medication Side Effects: None ? ?Allergies:  ?Allergies  ?Allergen Reactions  ? Levaquin [Levofloxacin] Anaphylaxis and Rash  ? Cefaclor Hives and Rash  ?  severe rash  ? Diphenhydramine Hives  ? Hydroxyzine Hives  ? Percocet [Oxycodone-Acetaminophen] Hives  ?  12/19/18: patient reports that she tolerates Oxycodone (but not Percocet)  ? Sulfonamide Derivatives Hives and Rash  ?   severe rash  ? Tramadol Nausea And Vomiting  ? Other Itching  ?  *Cantoloupe* makes mouth really itchy   ? Sunflower Oil Itching  ?  Mouth really itchy   ? Clindamycin/Lincomycin Rash  ? Codeine Rash  ? Keflex [Cephalexin] Rash  ? Morphine And Related Rash  ? Zofran [Ondansetron] Rash  ? ? ?Past Medical History:  ?Diagnosis Date  ? Anxiety   ? Depression   ? Family history of adverse reaction to anesthesia   ? maternal aunt had difficulty waken up.   ? Fibromyalgia   ? GERD (gastroesophageal reflux disease)   ? H/O multiple concussions   ? no residual  ? Heart murmur   ? as a child; had a heart defect. outgrew it  ? Hypothyroidism   ? no meds for slightlty below normal   ? Pelvic pain   ? Pneumonia last time 2014  ? PONV (postoperative nausea and vomiting)   ? with sx age 25 none since  ? Trauma 09/2010  ? cracked liver from being "stomped on by a horse".  ? ? ?Past Medical History, Surgical history, Social history, and Family history were reviewed and updated as appropriate.  ? ?Please see review of systems for further details on the patient's review from today.  ? ?Objective:  ? ?Physical Exam:  ?BP 117/72   Pulse 80  ? ?Physical Exam ?Constitutional:   ?   General: She is not in acute distress. ?Musculoskeletal:     ?   General: No deformity.  ?Neurological:  ?   Mental Status: She is alert and oriented to person, place, and time.  ?   Coordination: Coordination normal.  ?Psychiatric:     ?   Attention and Perception: Attention and perception normal. She does not perceive auditory or visual hallucinations.     ?   Mood and Affect: Mood normal. Mood is not anxious or depressed. Affect is not labile, blunt, angry or inappropriate.     ?   Speech: Speech normal.     ?   Behavior: Behavior normal.     ?   Thought Content: Thought content normal. Thought content is not paranoid or delusional. Thought content does not include homicidal or suicidal ideation. Thought content does not include homicidal or suicidal plan.      ?   Cognition and Memory: Cognition and memory normal.     ?   Judgment: Judgment normal.  ?   Comments: Insight intact  ? ? ?Lab Review:  ?   ?Component Value Date/Time  ? NA 137 06/24/2021 2339  ? K 4.0 06/24/2021 2339  ? CL 103 06/24/2021 2339  ? CO2 25 06/24/2021 2339  ? GLUCOSE 119 (H) 06/24/2021 2339  ? BUN 9 06/24/2021 2339  ? CREATININE 0.64 06/24/2021 2339  ? CALCIUM 9.4 06/24/2021 2339  ? PROT 7.2 06/24/2021 2339  ? ALBUMIN 4.0 06/24/2021  2339  ? AST 12 (L) 06/24/2021 2339  ? ALT 15 06/24/2021 2339  ? ALKPHOS 88 06/24/2021 2339  ? BILITOT 0.3 06/24/2021 2339  ? GFRNONAA >60 06/24/2021 2339  ? GFRAA >60 08/01/2020 1107  ? ? ?   ?Component Value Date/Time  ? WBC 11.5 (H) 01/01/2022 1400  ? WBC 12.4 (H) 06/24/2021 2339  ? RBC 5.13 (H) 01/01/2022 1400  ? HGB 11.0 (L) 01/01/2022 1400  ? HCT 35.9 (L) 01/01/2022 1400  ? PLT 465 (H) 01/01/2022 1400  ? MCV 70.0 (L) 01/01/2022 1400  ? MCH 21.4 (L) 01/01/2022 1400  ? MCHC 30.6 01/01/2022 1400  ? RDW 17.2 (H) 01/01/2022 1400  ? LYMPHSABS 2.6 01/01/2022 1400  ? MONOABS 0.5 01/01/2022 1400  ? EOSABS 0.0 01/01/2022 1400  ? BASOSABS 0.0 01/01/2022 1400  ? ? ?No results found for: POCLITH, LITHIUM  ? ?No results found for: PHENYTOIN, PHENOBARB, VALPROATE, CBMZ  ? ?.res ?Assessment: Plan:   ?Will continue current plan of care since target signs and symptoms are well controlled without any tolerability issues. ?Continue Trazodone to 100-150 mg at bedtime for insomnia.  ?Continue Lamictal 200 mg po qd for mood s/s.  ?Continue Sertraline 200 mg po qd for mood and anxiety. ?Continue Jornay PM 40 mg every evening.  ?Continue Alprazolam prn anxiety.  ?Pt to follow-up in 3 months or sooner if clinically indicated.  ?Patient advised to contact office with any questions, adverse effects, or acute worsening in signs and symptoms. ? ? ? ?Cristela was seen today for follow-up. ? ?Diagnoses and all orders for this visit: ? ?Episodic mood disorder (HCC) ?-     lamoTRIgine (LAMICTAL) 200  MG tablet; Take 1 tablet (200 mg total) by mouth at bedtime. ?-     sertraline (ZOLOFT) 100 MG tablet; Take 2 tablets (200 mg total) by mouth at bedtime. ? ?Insomnia, unspecified type ?-     traZODone

## 2022-03-08 ENCOUNTER — Inpatient Hospital Stay: Payer: BC Managed Care – PPO | Admitting: Oncology

## 2022-03-08 ENCOUNTER — Inpatient Hospital Stay: Payer: BC Managed Care – PPO

## 2022-03-12 ENCOUNTER — Inpatient Hospital Stay: Payer: BC Managed Care – PPO | Admitting: Oncology

## 2022-03-12 ENCOUNTER — Inpatient Hospital Stay: Payer: BC Managed Care – PPO | Attending: Nurse Practitioner

## 2022-03-12 VITALS — BP 141/76 | HR 88 | Temp 98.1°F | Resp 18 | Ht 65.0 in | Wt 284.0 lb

## 2022-03-12 DIAGNOSIS — G4700 Insomnia, unspecified: Secondary | ICD-10-CM | POA: Diagnosis not present

## 2022-03-12 DIAGNOSIS — Z79899 Other long term (current) drug therapy: Secondary | ICD-10-CM | POA: Insufficient documentation

## 2022-03-12 DIAGNOSIS — M797 Fibromyalgia: Secondary | ICD-10-CM | POA: Insufficient documentation

## 2022-03-12 DIAGNOSIS — D509 Iron deficiency anemia, unspecified: Secondary | ICD-10-CM

## 2022-03-12 DIAGNOSIS — D72829 Elevated white blood cell count, unspecified: Secondary | ICD-10-CM | POA: Insufficient documentation

## 2022-03-12 DIAGNOSIS — R110 Nausea: Secondary | ICD-10-CM | POA: Insufficient documentation

## 2022-03-12 DIAGNOSIS — F419 Anxiety disorder, unspecified: Secondary | ICD-10-CM | POA: Diagnosis not present

## 2022-03-12 DIAGNOSIS — F32A Depression, unspecified: Secondary | ICD-10-CM | POA: Insufficient documentation

## 2022-03-12 DIAGNOSIS — R197 Diarrhea, unspecified: Secondary | ICD-10-CM | POA: Diagnosis not present

## 2022-03-12 DIAGNOSIS — N809 Endometriosis, unspecified: Secondary | ICD-10-CM | POA: Diagnosis not present

## 2022-03-12 LAB — CBC WITH DIFFERENTIAL (CANCER CENTER ONLY)
Abs Immature Granulocytes: 0.04 10*3/uL (ref 0.00–0.07)
Basophils Absolute: 0 10*3/uL (ref 0.0–0.1)
Basophils Relative: 0 %
Eosinophils Absolute: 0 10*3/uL (ref 0.0–0.5)
Eosinophils Relative: 0 %
HCT: 38.5 % (ref 36.0–46.0)
Hemoglobin: 11.8 g/dL — ABNORMAL LOW (ref 12.0–15.0)
Immature Granulocytes: 0 %
Lymphocytes Relative: 29 %
Lymphs Abs: 3.4 10*3/uL (ref 0.7–4.0)
MCH: 22.1 pg — ABNORMAL LOW (ref 26.0–34.0)
MCHC: 30.6 g/dL (ref 30.0–36.0)
MCV: 72 fL — ABNORMAL LOW (ref 80.0–100.0)
Monocytes Absolute: 0.8 10*3/uL (ref 0.1–1.0)
Monocytes Relative: 6 %
Neutro Abs: 7.7 10*3/uL (ref 1.7–7.7)
Neutrophils Relative %: 65 %
Platelet Count: 496 10*3/uL — ABNORMAL HIGH (ref 150–400)
RBC: 5.35 MIL/uL — ABNORMAL HIGH (ref 3.87–5.11)
RDW: 18.6 % — ABNORMAL HIGH (ref 11.5–15.5)
WBC Count: 12 10*3/uL — ABNORMAL HIGH (ref 4.0–10.5)
nRBC: 0 % (ref 0.0–0.2)

## 2022-03-12 LAB — FERRITIN: Ferritin: 30 ng/mL (ref 11–307)

## 2022-03-12 NOTE — Progress Notes (Signed)
?  [facility] ?OFFICE PROGRESS NOTE ? ? ?Diagnosis: Anemia ? ?INTERVAL HISTORY:  ? ?Dawn King returns as scheduled.  She is taking ferrous sulfate twice daily.  She has chronic intermittent nausea, she does not associate this with taking iron.  No constipation.  She reports a 38-day menstrual cycle lasting throughout the entire month of April.  No other bleeding.  She has noted an improved energy level since starting iron.  She had an upper respiratory infection recently.  This has resolved. ? ?Objective: ? ?Vital signs in last 24 hours: ? ?Blood pressure (!) 141/76, pulse 88, temperature 98.1 ?F (36.7 ?C), temperature source Oral, resp. rate 18, height 5\' 5"  (1.651 m), weight 284 lb (128.8 kg), SpO2 100 %. ?  ? ? ?Resp: Lungs clear bilaterally ?Cardio: Regular rate and rhythm ?GI: No hepatosplenomegaly ?Vascular: No leg edema  ? ?Portacath/PICC-without erythema ? ?Lab Results: ? ?Lab Results  ?Component Value Date  ? WBC 12.0 (H) 03/12/2022  ? HGB 11.8 (L) 03/12/2022  ? HCT 38.5 03/12/2022  ? MCV 72.0 (L) 03/12/2022  ? PLT 496 (H) 03/12/2022  ? NEUTROABS 7.7 03/12/2022  ? ? ?CMP  ?Lab Results  ?Component Value Date  ? NA 137 06/24/2021  ? K 4.0 06/24/2021  ? CL 103 06/24/2021  ? CO2 25 06/24/2021  ? GLUCOSE 119 (H) 06/24/2021  ? BUN 9 06/24/2021  ? CREATININE 0.64 06/24/2021  ? CALCIUM 9.4 06/24/2021  ? PROT 7.2 06/24/2021  ? ALBUMIN 4.0 06/24/2021  ? AST 12 (L) 06/24/2021  ? ALT 15 06/24/2021  ? ALKPHOS 88 06/24/2021  ? BILITOT 0.3 06/24/2021  ? GFRNONAA >60 06/24/2021  ? GFRAA >60 08/01/2020  ? ? ? ?Medications: I have reviewed the patient's current medications. ? ? ?Assessment/Plan: ?Microcytic anemia ?09/01/2021 hemoglobin 10.8, MCV 69, RDW 16, white count 9.5, platelet count 516,000, ferritin 20 ?10/20/2021 hemoglobin 10.9, MCV 71, RDW 17, platelet count 460,000, ferritin 21 ?10/20/2021 hemoglobin electrophoresis negative for beta thalassemia, alpha thalassemia testing negative for the 7 most  common deletion ?Per patient report stool cards submitted for fecal occult blood testing January 2023--fecal occult blood negative on 11/27/2021 ?Multiple family members with malignancy-patient reports genetics referral completed with OB/GYN ?Change in bowel habits 2 months ago with diarrhea ?Insomnia ?Anxiety/depression ?Fibromyalgia ?Endometriosis ?Mild leukocytosis and thrombocytosis-likely secondary to iron deficiency ? ? ? ?Disposition: ?Dawn King has persistent mild microcytic anemia.  The anemia the anemia is likely secondary to menorrhagia and iron deficiency.  I recommend she continue oral iron therapy.  The anemia partly improved over the past few months. ?She has chronic mild leukocytosis and thrombocytosis, likely related to iron deficiency.  I have a low clinical suspicion for a myeloproliferative disorder. ? ?She will continue ferrous sulfate twice daily.  She will return for an office visit, CBC, and serum iron studies in 2 months. ? ?Betsy Coder, MD ? ?03/12/2022  ?3:17 PM ? ? ?

## 2022-03-29 DIAGNOSIS — R112 Nausea with vomiting, unspecified: Secondary | ICD-10-CM | POA: Diagnosis not present

## 2022-03-29 DIAGNOSIS — Z131 Encounter for screening for diabetes mellitus: Secondary | ICD-10-CM | POA: Diagnosis not present

## 2022-03-29 DIAGNOSIS — R5383 Other fatigue: Secondary | ICD-10-CM | POA: Diagnosis not present

## 2022-04-02 IMAGING — CT CT ABD-PELV W/ CM
2 of 4 series · 16 of 46 positions shown, 18 images · IV contrast (iopamidol)
Comparison: 09/07/2016

CLINICAL DATA: Abdominal and pelvic pain and nausea for 1 month.

EXAM:
CT ABDOMEN AND PELVIS WITH CONTRAST
TECHNIQUE: Multidetector CT imaging of the abdomen and pelvis was performed
using the standard protocol following bolus administration of
intravenous contrast.
CONTRAST:  100mL [id number] IOPAMIDOL ([id number]) INJECTION 61%

[Series 2: abd pelvis 5.00 br40 s3 axial · axial · 0.86mm/px · z∈[+1224,+1664]mm · 13 of 96 slices shown, 15 images]
[im 4/96  soft-tissue]
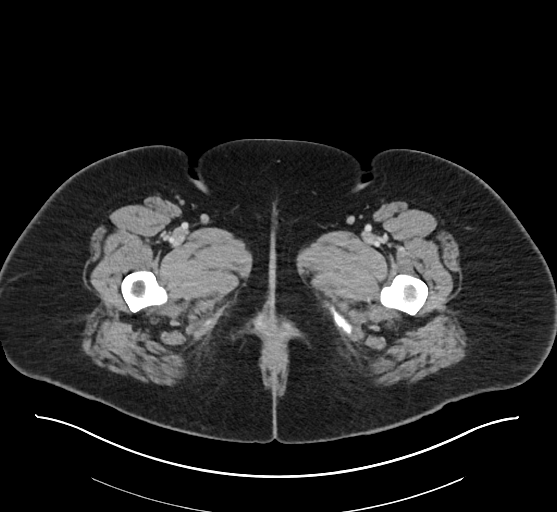
[im 4/96  bone]
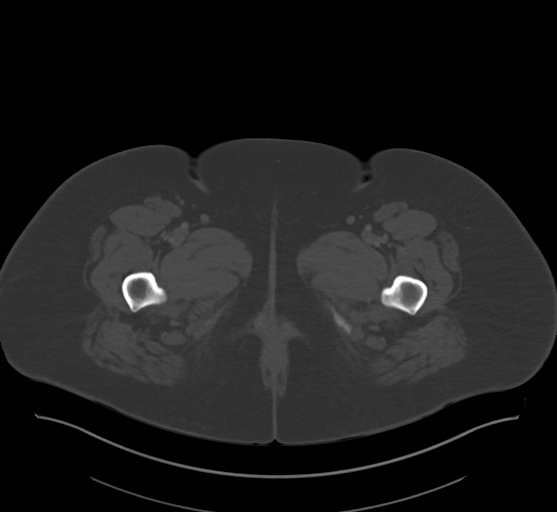
[im 12/96  soft-tissue]
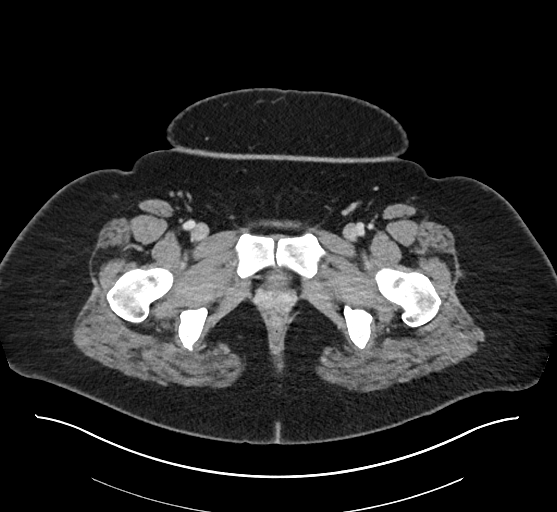
[im 20/96  soft-tissue]
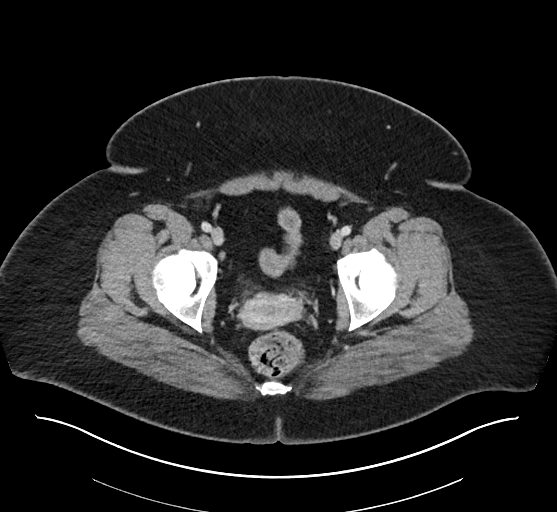
[im 28/96  soft-tissue]
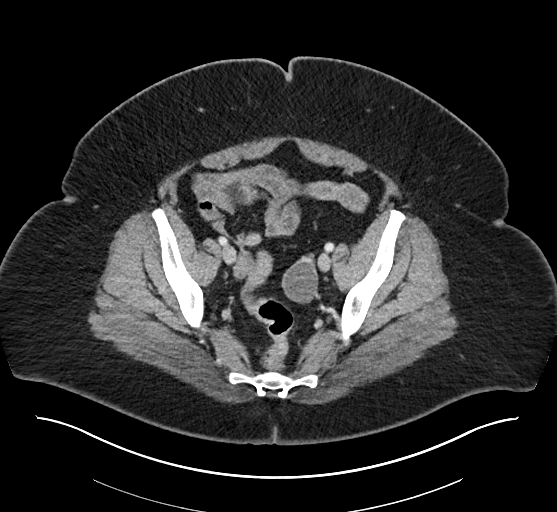
[im 32/96  soft-tissue]
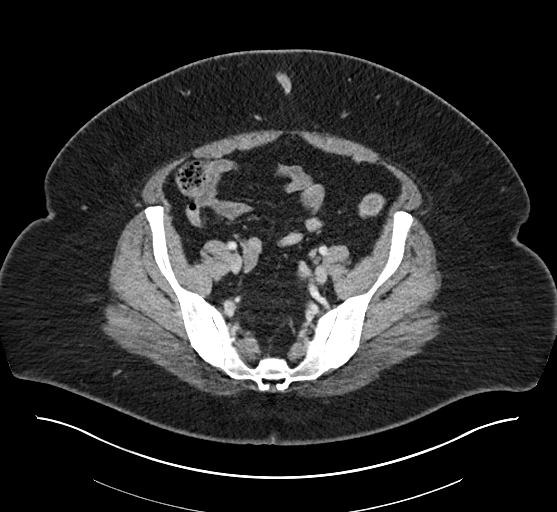
[im 40/96  soft-tissue]
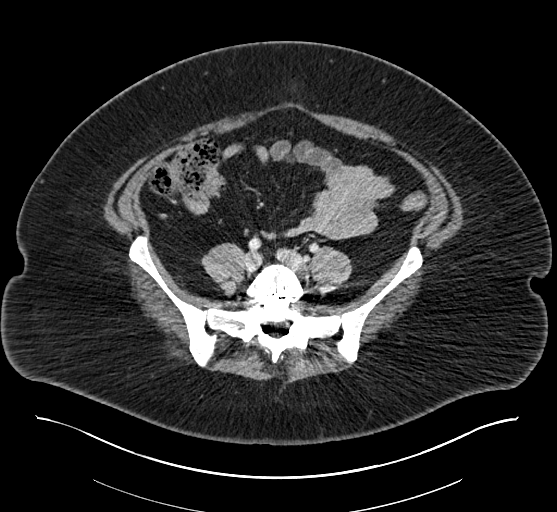
[im 48/96  soft-tissue]
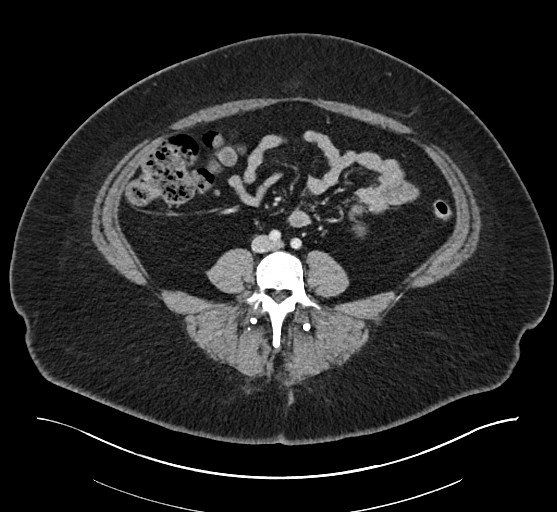
[im 56/96  soft-tissue]
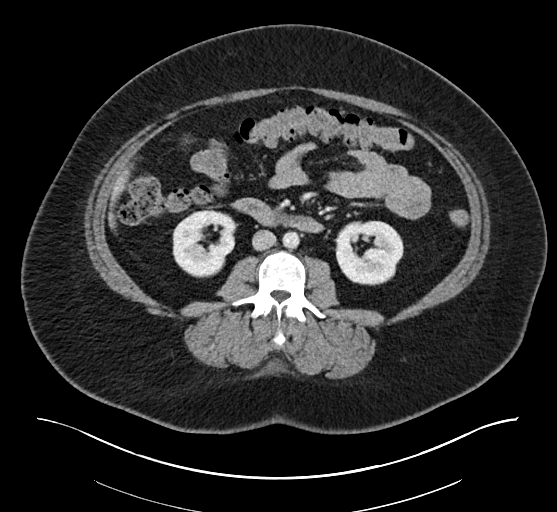
[im 64/96  soft-tissue]
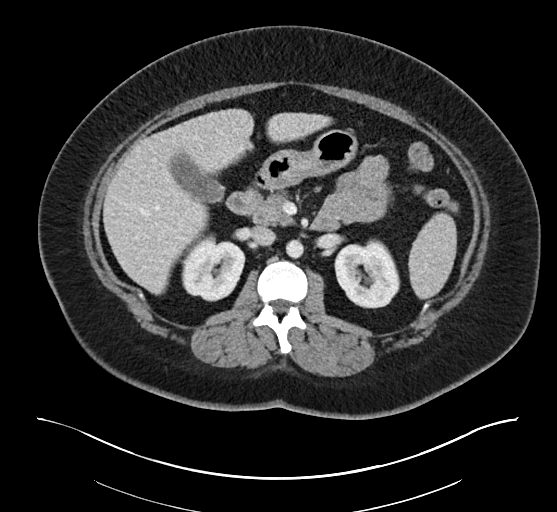
[im 64/96  bone]
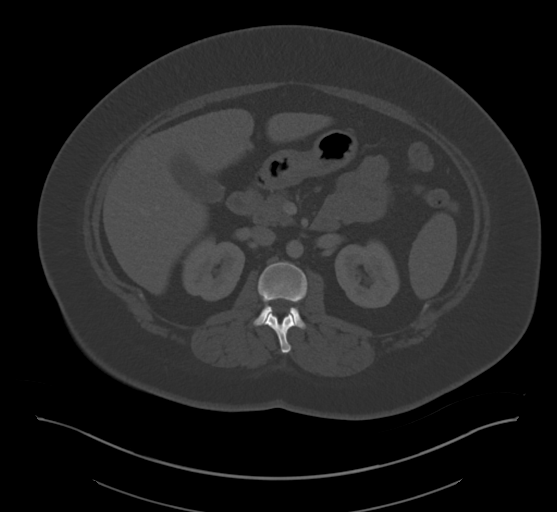
[im 68/96  soft-tissue]
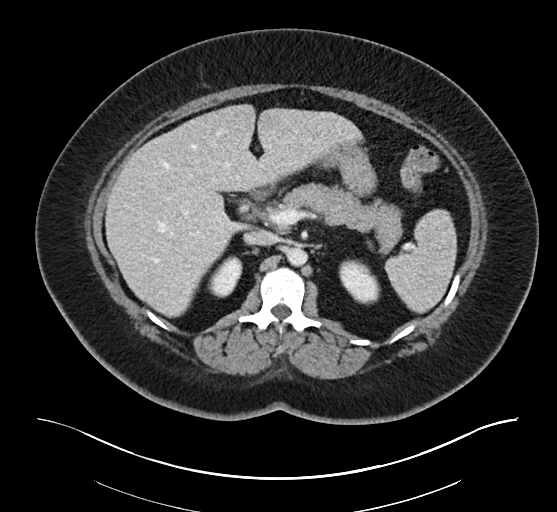
[im 76/96  soft-tissue]
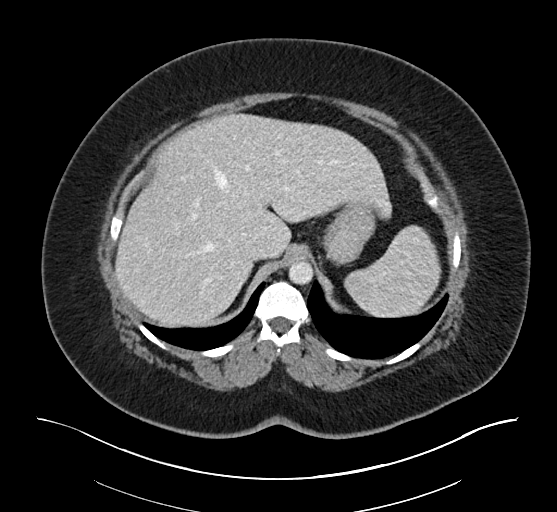
[im 84/96  soft-tissue]
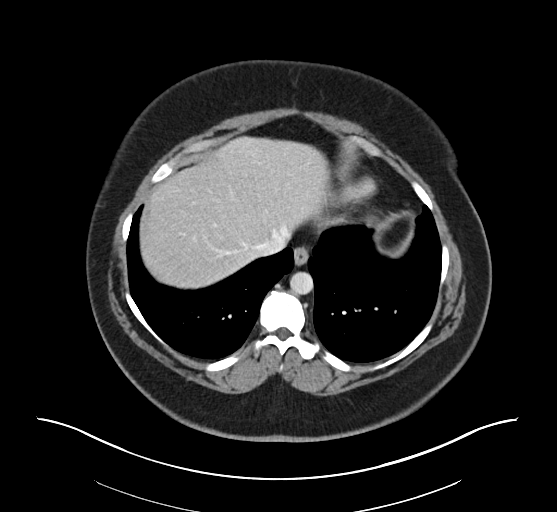
[im 92/96  soft-tissue]
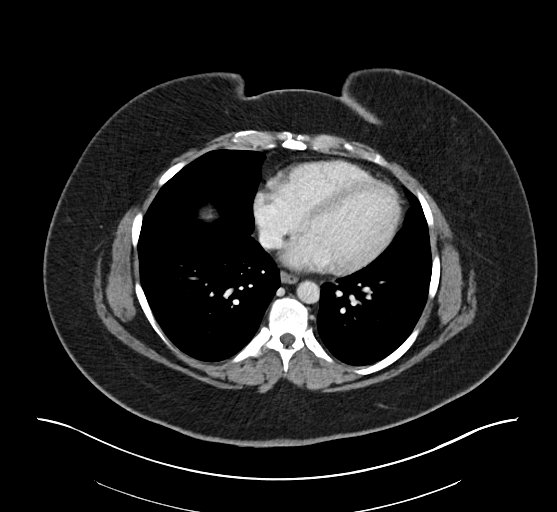

[Series 6: abd pelvis 2.00 br40 s3 cor · coronal · 0.94mm/px · 3 of 184 slices shown]
[im 62/184  soft-tissue]
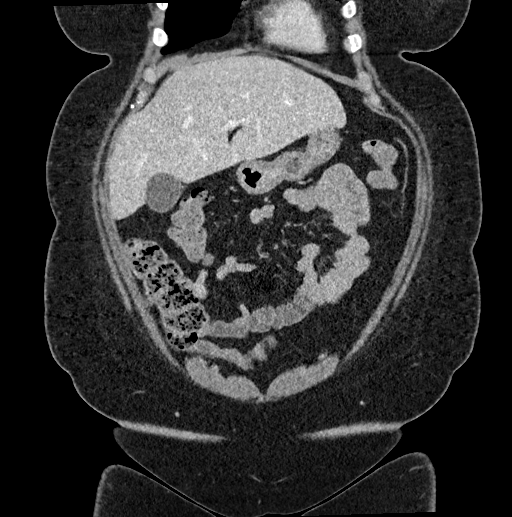
[im 82/184  soft-tissue]
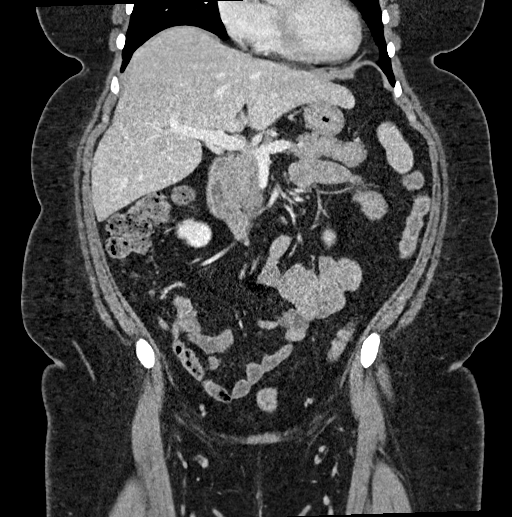
[im 102/184  soft-tissue]
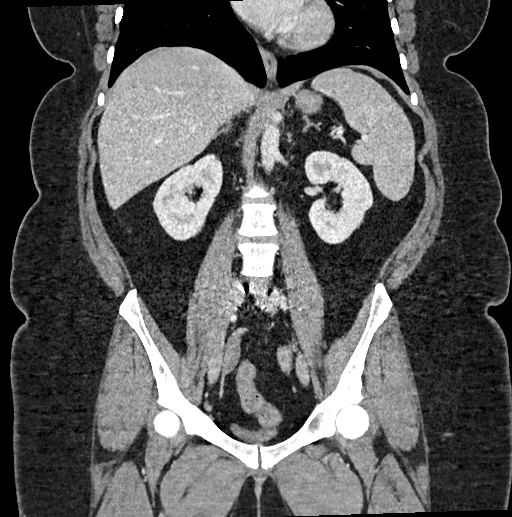

[16 of 46 positions shown; findings below may reference images not displayed]

FINDINGS: Lower Chest: No acute findings.

Hepatobiliary: No hepatic masses identified. Gallbladder is
unremarkable. No evidence of biliary ductal dilatation.

Pancreas:  No mass or inflammatory changes.

Spleen: Within normal limits in size and appearance.

Adrenals/Urinary Tract: No masses identified. No evidence of
ureteral calculi or hydronephrosis.

Stomach/Bowel: No evidence of obstruction, inflammatory process or
abnormal fluid collections. Normal appendix visualized.

Vascular/Lymphatic: No pathologically enlarged lymph nodes. No
abdominal aortic aneurysm.

Reproductive: Normal appearance of uterus. A simple appearing cyst
is seen in the left adnexa which measures 3.6 x 2.9 cm on image
69/2. No other pelvic masses, inflammatory process, or abnormal
fluid collections identified.

Other:  None.

Musculoskeletal: No suspicious bone lesions identified. PLIF
hardware noted at L5-S1.
IMPRESSION: 3.6 cm simple appearing left adnexal cyst, most likely physiologic
in a reproductive age female. No follow-up imaging recommended.
Note: This recommendation does not apply to premenarchal patients
and to those with increased risk (genetic, family history, elevated
tumor markers or other high-risk factors) of ovarian cancer.
Reference: JACR [DATE]):[id number]

## 2022-04-18 DIAGNOSIS — Z6841 Body Mass Index (BMI) 40.0 and over, adult: Secondary | ICD-10-CM | POA: Diagnosis not present

## 2022-04-18 DIAGNOSIS — Z01419 Encounter for gynecological examination (general) (routine) without abnormal findings: Secondary | ICD-10-CM | POA: Diagnosis not present

## 2022-05-14 DIAGNOSIS — Z1231 Encounter for screening mammogram for malignant neoplasm of breast: Secondary | ICD-10-CM | POA: Diagnosis not present

## 2022-05-14 DIAGNOSIS — Z1321 Encounter for screening for nutritional disorder: Secondary | ICD-10-CM | POA: Diagnosis not present

## 2022-05-14 DIAGNOSIS — Z131 Encounter for screening for diabetes mellitus: Secondary | ICD-10-CM | POA: Diagnosis not present

## 2022-05-14 DIAGNOSIS — E785 Hyperlipidemia, unspecified: Secondary | ICD-10-CM | POA: Diagnosis not present

## 2022-05-14 DIAGNOSIS — R748 Abnormal levels of other serum enzymes: Secondary | ICD-10-CM | POA: Diagnosis not present

## 2022-05-17 ENCOUNTER — Inpatient Hospital Stay: Payer: BC Managed Care – PPO | Admitting: Nurse Practitioner

## 2022-05-17 ENCOUNTER — Inpatient Hospital Stay: Payer: BC Managed Care – PPO | Attending: Nurse Practitioner

## 2022-05-17 ENCOUNTER — Encounter: Payer: Self-pay | Admitting: Nurse Practitioner

## 2022-05-17 VITALS — BP 134/72 | HR 88 | Temp 98.1°F | Resp 18 | Ht 65.0 in | Wt 284.4 lb

## 2022-05-17 DIAGNOSIS — D72829 Elevated white blood cell count, unspecified: Secondary | ICD-10-CM | POA: Insufficient documentation

## 2022-05-17 DIAGNOSIS — R110 Nausea: Secondary | ICD-10-CM | POA: Diagnosis not present

## 2022-05-17 DIAGNOSIS — F419 Anxiety disorder, unspecified: Secondary | ICD-10-CM | POA: Insufficient documentation

## 2022-05-17 DIAGNOSIS — M797 Fibromyalgia: Secondary | ICD-10-CM | POA: Insufficient documentation

## 2022-05-17 DIAGNOSIS — Z79899 Other long term (current) drug therapy: Secondary | ICD-10-CM | POA: Diagnosis not present

## 2022-05-17 DIAGNOSIS — N809 Endometriosis, unspecified: Secondary | ICD-10-CM | POA: Insufficient documentation

## 2022-05-17 DIAGNOSIS — F32A Depression, unspecified: Secondary | ICD-10-CM | POA: Insufficient documentation

## 2022-05-17 DIAGNOSIS — G4700 Insomnia, unspecified: Secondary | ICD-10-CM | POA: Insufficient documentation

## 2022-05-17 DIAGNOSIS — D509 Iron deficiency anemia, unspecified: Secondary | ICD-10-CM

## 2022-05-17 DIAGNOSIS — R197 Diarrhea, unspecified: Secondary | ICD-10-CM | POA: Insufficient documentation

## 2022-05-17 LAB — CBC WITH DIFFERENTIAL (CANCER CENTER ONLY)
Abs Immature Granulocytes: 0.11 10*3/uL — ABNORMAL HIGH (ref 0.00–0.07)
Basophils Absolute: 0 10*3/uL (ref 0.0–0.1)
Basophils Relative: 0 %
Eosinophils Absolute: 0 10*3/uL (ref 0.0–0.5)
Eosinophils Relative: 0 %
HCT: 38.1 % (ref 36.0–46.0)
Hemoglobin: 12 g/dL (ref 12.0–15.0)
Immature Granulocytes: 1 %
Lymphocytes Relative: 23 %
Lymphs Abs: 2.6 10*3/uL (ref 0.7–4.0)
MCH: 23.2 pg — ABNORMAL LOW (ref 26.0–34.0)
MCHC: 31.5 g/dL (ref 30.0–36.0)
MCV: 73.7 fL — ABNORMAL LOW (ref 80.0–100.0)
Monocytes Absolute: 0.7 10*3/uL (ref 0.1–1.0)
Monocytes Relative: 6 %
Neutro Abs: 7.6 10*3/uL (ref 1.7–7.7)
Neutrophils Relative %: 70 %
Platelet Count: 379 10*3/uL (ref 150–400)
RBC: 5.17 MIL/uL — ABNORMAL HIGH (ref 3.87–5.11)
RDW: 17.5 % — ABNORMAL HIGH (ref 11.5–15.5)
WBC Count: 11 10*3/uL — ABNORMAL HIGH (ref 4.0–10.5)
nRBC: 0 % (ref 0.0–0.2)

## 2022-05-17 LAB — IRON AND TIBC
Iron: 48 ug/dL (ref 28–170)
Saturation Ratios: 14 % (ref 10.4–31.8)
TIBC: 337 ug/dL (ref 250–450)
UIBC: 289 ug/dL

## 2022-05-17 LAB — FERRITIN: Ferritin: 75 ng/mL (ref 11–307)

## 2022-05-17 NOTE — Progress Notes (Signed)
  [facility] OFFICE PROGRESS NOTE   Diagnosis: Anemia  INTERVAL HISTORY:   Ms. Heinrichs returns as scheduled.  She continues ferrous sulfate twice daily.  She reports tolerating oral iron well overall.  She continues to have intermittent nausea and diarrhea both of which predated the start of oral iron.  Menstrual cycle is much better since beginning progesterone.  She reports in general not feeling well for many years.  She feels she is sick frequently with respiratory infections.  Objective:  Vital signs in last 24 hours:  Blood pressure 134/72, pulse 88, temperature 98.1 F (36.7 C), temperature source Oral, resp. rate 18, height 5\' 5"  (1.651 m), weight 284 lb 6.4 oz (129 kg), SpO2 98 %.    Resp: Lungs clear bilaterally. Cardio: Regular rate and rhythm. GI: No hepatosplenomegaly. Vascular: No leg edema.   Lab Results:  Lab Results  Component Value Date   WBC 11.0 (H) 05/17/2022   HGB 12.0 05/17/2022   HCT 38.1 05/17/2022   MCV 73.7 (L) 05/17/2022   PLT 379 05/17/2022   NEUTROABS 7.6 05/17/2022    Imaging:  No results found.  Medications: I have reviewed the patient's current medications.  Assessment/Plan: Microcytic anemia 09/01/2021 hemoglobin 10.8, MCV 69, RDW 16, white count 9.5, platelet count 516,000, ferritin 20 10/20/2021 hemoglobin 10.9, MCV 71, RDW 17, platelet count 460,000, ferritin 21 10/20/2021 hemoglobin electrophoresis negative for beta thalassemia, alpha thalassemia testing negative for the 7 most common deletions Per patient report stool cards submitted for fecal occult blood testing January 2023--fecal occult blood negative on 11/27/2021 05/17/2022 hemoglobin 12, MCV 73, white count 11, platelet count 379,000 Multiple family members with malignancy-patient reports genetics referral completed with OB/GYN Change in bowel habits 2 months ago with diarrhea Insomnia Anxiety/depression Fibromyalgia Endometriosis Mild leukocytosis and  thrombocytosis-likely secondary to iron deficiency; platelet count in normal range 05/17/2022    Disposition: Ms. Brossman is stable from a hematologic standpoint.  The hemoglobin has corrected into low normal range.  She has persistent microcytosis.  The platelet count is now in normal range.  She will continue oral iron.  We will follow-up on the ferritin from today.  She will return for CBC, ferritin and follow-up appointment in 3 months.  She will follow-up with her PCP regarding frequent respiratory infections.   Louis Matte ANP/GNP-BC   05/17/2022  1:15 PM

## 2022-05-21 ENCOUNTER — Other Ambulatory Visit: Payer: Self-pay | Admitting: Obstetrics and Gynecology

## 2022-05-21 DIAGNOSIS — Z803 Family history of malignant neoplasm of breast: Secondary | ICD-10-CM

## 2022-06-01 ENCOUNTER — Encounter: Payer: Self-pay | Admitting: Psychiatry

## 2022-06-01 ENCOUNTER — Ambulatory Visit (INDEPENDENT_AMBULATORY_CARE_PROVIDER_SITE_OTHER): Payer: BC Managed Care – PPO | Admitting: Psychiatry

## 2022-06-01 DIAGNOSIS — F39 Unspecified mood [affective] disorder: Secondary | ICD-10-CM

## 2022-06-01 DIAGNOSIS — G4700 Insomnia, unspecified: Secondary | ICD-10-CM

## 2022-06-01 DIAGNOSIS — F419 Anxiety disorder, unspecified: Secondary | ICD-10-CM | POA: Diagnosis not present

## 2022-06-01 DIAGNOSIS — F900 Attention-deficit hyperactivity disorder, predominantly inattentive type: Secondary | ICD-10-CM

## 2022-06-01 MED ORDER — JORNAY PM 40 MG PO CP24: 40.0000 mg | ORAL_CAPSULE | Freq: Every evening | ORAL | 0 refills | Status: DC

## 2022-06-01 MED ORDER — SERTRALINE HCL 100 MG PO TABS: 200.0000 mg | ORAL_TABLET | Freq: Every day | ORAL | 1 refills | Status: DC

## 2022-06-01 MED ORDER — ALPRAZOLAM 0.5 MG PO TABS: 0.5000 mg | ORAL_TABLET | Freq: Every day | ORAL | 2 refills | Status: DC | PRN

## 2022-06-01 MED ORDER — TRAZODONE HCL 100 MG PO TABS: ORAL_TABLET | ORAL | 1 refills | Status: DC

## 2022-06-01 MED ORDER — LAMOTRIGINE 200 MG PO TABS: 200.0000 mg | ORAL_TABLET | Freq: Every day | ORAL | 1 refills | Status: DC

## 2022-06-01 NOTE — Progress Notes (Signed)
[name] [id number] Aug 19, 1991 31 y.o.  Subjective:   Patient ID:  Dawn King is a 31 y.o. (DOB 1991-04-19) female.  Chief Complaint:  Chief Complaint  Patient presents with  . Follow-up    Anxiety, depression, ADHD, and insomnia    HPI Dawn King presents to the office today for follow-up of anxiety, depression, ADHD, and insomnia. She had a recent 4 day period where she was not able to sleep. She took 2 Trazodone one night and had limited sleep and the next night took one Trazodone and slept that night and last night. She reports that otherwise she has been sleeping well. She has had cycles of insomnia in the past. Denies any significant anxiety or panic. Denies depressed mood. Energy and motivation have been ok. Concentration has been ok. Appetite has been "almost non-existent." She reports that low appetite has been long-standing. Denies SI.   She reports that she had one day of depression this past Sunday that was triggered by body image issues. She then reached out to her sister for support.   She has been waking up at 7:45 am.   She took off work last week because 37 month old nephew was visiting from [place].   Has been at current job for about 4 years.   Has not seen a therapist recently.    She reports that she takes Xanax prn about 1-2 times a month. Last filled 03/29/22.   Past Psychiatric Medication Trials: Concerta- Helpful. Started in 5th grade. Took it sporadically about 5-6 years ago and not since. Does not recall side effects. Took 36 mg po qd. Adderall- Had loss of appetite. May have been XR. Sertraline- Has taken since 8th grade. Has been helpful for mood and anxiety. Paxil- Took prior to 8th grade. Lamictal-Started 5 years ago. Has been helpful for depression and mood lability. Hydroxyzine- allergic reaction Xanax- Intermittently effective Valium- prescribed for muscle spasms    Flowsheet Row ED from 06/24/2021 in  [facility] ED from 11/07/2018 in [facility]  C-SSRS RISK CATEGORY No Risk No Risk        Review of Systems:  Review of Systems  Cardiovascular:  Negative for palpitations.  Gastrointestinal:        Has been having 2 week periods of nausea and it will resolve and then reoccur.   Musculoskeletal:  Negative for gait problem.  Neurological:        Long-standing tremor  Hematological:        Has seen hematology.   Psychiatric/Behavioral:         Please refer to HPI    Medications: I have reviewed the patient's current medications.  Current Outpatient Medications  Medication Sig Dispense Refill  . ferrous sulfate 325 (65 FE) MG tablet Take 325 mg by mouth daily with breakfast.    . medroxyPROGESTERone Acetate 150 MG/ML SUSY medroxyprogesterone 150 mg/mL intramuscular syringe  Inject 1 mL every 3 months by intramuscular route.    Melene Muller ON 06/29/2022] Methylphenidate HCl ER, PM, (JORNAY PM) 40 MG CP24 Take 40 mg by mouth every evening. 30 capsule 0  . [START ON 07/27/2022] Methylphenidate HCl ER, PM, (JORNAY PM) 40 MG CP24 Take 40 mg by mouth every evening. 30 capsule 0  . promethazine (PHENERGAN) 25 MG tablet Take 25 mg by mouth every 6 (six) hours as needed for nausea or vomiting.    . SUMAtriptan (IMITREX) 50 MG tablet sumatriptan 50 mg tablet    . [  START ON 06/29/2022] ALPRAZolam (XANAX) 0.5 MG tablet Take 1 tablet (0.5 mg total) by mouth daily as needed for anxiety. 15 tablet 2  . Cyanocobalamin (VITAMIN B 12 PO) Take by mouth. (Patient not taking: Reported on 05/17/2022)    . lamoTRIgine (LAMICTAL) 200 MG tablet Take 1 tablet (200 mg total) by mouth at bedtime. 90 tablet 1  . Methylphenidate HCl ER, PM, (JORNAY PM) 40 MG CP24 Take 40 mg by mouth every evening. 30 capsule 0  . sertraline (ZOLOFT) 100 MG tablet Take 2 tablets (200 mg total) by mouth at bedtime. 180 tablet 1  . traZODone (DESYREL) 100 MG tablet Take 1-1.5  tablets po QHS prn insomnia 145 tablet 1   No current facility-administered medications for this visit.    Medication Side Effects: None  Allergies:  Allergies  Allergen Reactions  . Levaquin [Levofloxacin] Anaphylaxis and Rash  . Cefaclor Hives and Rash    severe rash  . Diphenhydramine Hives  . Hydroxyzine Hives  . Percocet [Oxycodone-Acetaminophen] Hives    12/19/18: patient reports that she tolerates Oxycodone (but not Percocet)  . Sulfonamide Derivatives Hives and Rash    severe rash  . Tramadol Nausea And Vomiting  . Other Itching    *Cantoloupe* makes mouth really itchy   . Sunflower Oil Itching    Mouth really itchy   . Clindamycin/Lincomycin Rash  . Codeine Rash  . Keflex [Cephalexin] Rash  . Morphine And Related Rash  . Zofran [Ondansetron] Rash    Past Medical History:  Diagnosis Date  . Anxiety   . Depression   . Family history of adverse reaction to anesthesia    maternal aunt had difficulty waken up.   . Fibromyalgia   . GERD (gastroesophageal reflux disease)   . H/O multiple concussions    no residual  . Heart murmur    as a child; had a heart defect. outgrew it  . Hypothyroidism    no meds for slightlty below normal   . Pelvic pain   . Pneumonia last time 2014  . PONV (postoperative nausea and vomiting)    with sx age 73 none since  . Trauma 09/2010   cracked liver from being "stomped on by a horse".    Past Medical History, Surgical history, Social history, and Family history were reviewed and updated as appropriate.   Please see review of systems for further details on the patient's review from today.   Objective:   Physical Exam:  BP 121/80   Pulse 83   Physical Exam Constitutional:      General: She is not in acute distress. Musculoskeletal:        General: No deformity.  Neurological:     Mental Status: She is alert and oriented to person, place, and time.     Coordination: Coordination normal.  Psychiatric:        Attention  and Perception: Attention and perception normal. She does not perceive auditory or visual hallucinations.        Mood and Affect: Mood normal. Mood is not anxious or depressed. Affect is not labile, blunt, angry or inappropriate.        Speech: Speech normal.        Behavior: Behavior normal.        Thought Content: Thought content normal. Thought content is not paranoid or delusional. Thought content does not include homicidal or suicidal ideation. Thought content does not include homicidal or suicidal plan.  Cognition and Memory: Cognition and memory normal.        Judgment: Judgment normal.     Comments: Insight intact    Lab Review:     Component Value Date/Time   NA 137 06/24/2021 2339   K 4.0 06/24/2021 2339   CL 103 06/24/2021 2339   CO2 25 06/24/2021 2339   GLUCOSE 119 (H) 06/24/2021 2339   BUN 9 06/24/2021 2339   CREATININE 0.64 06/24/2021 2339   CALCIUM 9.4 06/24/2021 2339   PROT 7.2 06/24/2021 2339   ALBUMIN 4.0 06/24/2021 2339   AST 12 (L) 06/24/2021 2339   ALT 15 06/24/2021 2339   ALKPHOS 88 06/24/2021 2339   BILITOT 0.3 06/24/2021 2339   GFRNONAA >60 06/24/2021 2339   GFRAA >60 08/01/2020 1107       Component Value Date/Time   WBC 11.0 (H) 05/17/2022 1302   WBC 12.4 (H) 06/24/2021 2339   RBC 5.17 (H) 05/17/2022 1302   HGB 12.0 05/17/2022 1302   HCT 38.1 05/17/2022 1302   PLT 379 05/17/2022 1302   MCV 73.7 (L) 05/17/2022 1302   MCH 23.2 (L) 05/17/2022 1302   MCHC 31.5 05/17/2022 1302   RDW 17.5 (H) 05/17/2022 1302   LYMPHSABS 2.6 05/17/2022 1302   MONOABS 0.7 05/17/2022 1302   EOSABS 0.0 05/17/2022 1302   BASOSABS 0.0 05/17/2022 1302    No results found for: "POCLITH", "LITHIUM"   No results found for: "PHENYTOIN", "PHENOBARB", "VALPROATE", "CBMZ"   .res Assessment: Plan:    Lynea was seen today for follow-up.  Diagnoses and all orders for this visit:  Anxiety disorder, unspecified type -     ALPRAZolam (XANAX) 0.5 MG tablet; Take 1  tablet (0.5 mg total) by mouth daily as needed for anxiety. -     sertraline (ZOLOFT) 100 MG tablet; Take 2 tablets (200 mg total) by mouth at bedtime.  Attention deficit hyperactivity disorder (ADHD), predominantly inattentive type -     Methylphenidate HCl ER, PM, (JORNAY PM) 40 MG CP24; Take 40 mg by mouth every evening. -     Methylphenidate HCl ER, PM, (JORNAY PM) 40 MG CP24; Take 40 mg by mouth every evening. -     Methylphenidate HCl ER, PM, (JORNAY PM) 40 MG CP24; Take 40 mg by mouth every evening.  Episodic mood disorder (HCC) -     lamoTRIgine (LAMICTAL) 200 MG tablet; Take 1 tablet (200 mg total) by mouth at bedtime. -     sertraline (ZOLOFT) 100 MG tablet; Take 2 tablets (200 mg total) by mouth at bedtime.  Insomnia, unspecified type -     traZODone (DESYREL) 100 MG tablet; Take 1-1.5 tablets po QHS prn insomnia     Please see After Visit Summary for patient specific instructions.  Future Appointments  Date Time Provider Department Center  08/16/2022  2:15 PM [facility] PHLEBOTOMIST [facility] None  08/16/2022  2:40 PM Ladene Artist, MD [facility] None    No orders of the defined types were placed in this encounter.   -------------------------------

## 2022-06-22 DIAGNOSIS — R110 Nausea: Secondary | ICD-10-CM | POA: Diagnosis not present

## 2022-06-22 DIAGNOSIS — R630 Anorexia: Secondary | ICD-10-CM | POA: Diagnosis not present

## 2022-06-22 DIAGNOSIS — R8290 Unspecified abnormal findings in urine: Secondary | ICD-10-CM | POA: Diagnosis not present

## 2022-06-22 DIAGNOSIS — R32 Unspecified urinary incontinence: Secondary | ICD-10-CM | POA: Diagnosis not present

## 2022-06-22 DIAGNOSIS — R809 Proteinuria, unspecified: Secondary | ICD-10-CM | POA: Diagnosis not present

## 2022-07-03 DIAGNOSIS — R197 Diarrhea, unspecified: Secondary | ICD-10-CM | POA: Diagnosis not present

## 2022-07-03 DIAGNOSIS — R112 Nausea with vomiting, unspecified: Secondary | ICD-10-CM | POA: Diagnosis not present

## 2022-07-03 DIAGNOSIS — R634 Abnormal weight loss: Secondary | ICD-10-CM | POA: Diagnosis not present

## 2022-07-03 DIAGNOSIS — K219 Gastro-esophageal reflux disease without esophagitis: Secondary | ICD-10-CM | POA: Diagnosis not present

## 2022-07-04 DIAGNOSIS — R634 Abnormal weight loss: Secondary | ICD-10-CM | POA: Diagnosis not present

## 2022-07-04 DIAGNOSIS — R197 Diarrhea, unspecified: Secondary | ICD-10-CM | POA: Diagnosis not present

## 2022-07-04 DIAGNOSIS — K219 Gastro-esophageal reflux disease without esophagitis: Secondary | ICD-10-CM | POA: Diagnosis not present

## 2022-07-04 DIAGNOSIS — R112 Nausea with vomiting, unspecified: Secondary | ICD-10-CM | POA: Diagnosis not present

## 2022-07-19 ENCOUNTER — Ambulatory Visit: Payer: BC Managed Care – PPO | Attending: Family Medicine | Admitting: Physical Therapy

## 2022-07-27 DIAGNOSIS — Z0000 Encounter for general adult medical examination without abnormal findings: Secondary | ICD-10-CM | POA: Diagnosis not present

## 2022-07-27 DIAGNOSIS — E785 Hyperlipidemia, unspecified: Secondary | ICD-10-CM | POA: Diagnosis not present

## 2022-07-27 DIAGNOSIS — Z131 Encounter for screening for diabetes mellitus: Secondary | ICD-10-CM | POA: Diagnosis not present

## 2022-08-02 DIAGNOSIS — R110 Nausea: Secondary | ICD-10-CM | POA: Diagnosis not present

## 2022-08-02 DIAGNOSIS — E785 Hyperlipidemia, unspecified: Secondary | ICD-10-CM | POA: Diagnosis not present

## 2022-08-02 DIAGNOSIS — F32A Depression, unspecified: Secondary | ICD-10-CM | POA: Diagnosis not present

## 2022-08-02 DIAGNOSIS — Z23 Encounter for immunization: Secondary | ICD-10-CM | POA: Diagnosis not present

## 2022-08-02 DIAGNOSIS — Z1239 Encounter for other screening for malignant neoplasm of breast: Secondary | ICD-10-CM | POA: Diagnosis not present

## 2022-08-02 DIAGNOSIS — Z0001 Encounter for general adult medical examination with abnormal findings: Secondary | ICD-10-CM | POA: Diagnosis not present

## 2022-08-02 DIAGNOSIS — R630 Anorexia: Secondary | ICD-10-CM | POA: Diagnosis not present

## 2022-08-08 DIAGNOSIS — K259 Gastric ulcer, unspecified as acute or chronic, without hemorrhage or perforation: Secondary | ICD-10-CM | POA: Diagnosis not present

## 2022-08-08 DIAGNOSIS — K319 Disease of stomach and duodenum, unspecified: Secondary | ICD-10-CM | POA: Diagnosis not present

## 2022-08-08 DIAGNOSIS — R197 Diarrhea, unspecified: Secondary | ICD-10-CM | POA: Diagnosis not present

## 2022-08-08 DIAGNOSIS — R112 Nausea with vomiting, unspecified: Secondary | ICD-10-CM | POA: Diagnosis not present

## 2022-08-08 DIAGNOSIS — R634 Abnormal weight loss: Secondary | ICD-10-CM | POA: Diagnosis not present

## 2022-08-15 DIAGNOSIS — R195 Other fecal abnormalities: Secondary | ICD-10-CM | POA: Diagnosis not present

## 2022-08-15 DIAGNOSIS — K279 Peptic ulcer, site unspecified, unspecified as acute or chronic, without hemorrhage or perforation: Secondary | ICD-10-CM | POA: Diagnosis not present

## 2022-08-16 ENCOUNTER — Inpatient Hospital Stay: Payer: BC Managed Care – PPO

## 2022-08-16 ENCOUNTER — Telehealth: Payer: Self-pay | Admitting: Oncology

## 2022-08-16 ENCOUNTER — Inpatient Hospital Stay: Payer: BC Managed Care – PPO | Admitting: Oncology

## 2022-08-16 NOTE — Telephone Encounter (Signed)
Patient left a voicemail about cancelling appointments today due to [facility]. Attempted to reach patient to reschedule no answer so voicemail was left for patient to call back when she would like to reschedule

## 2022-08-28 DIAGNOSIS — R195 Other fecal abnormalities: Secondary | ICD-10-CM | POA: Diagnosis not present

## 2022-08-28 DIAGNOSIS — R112 Nausea with vomiting, unspecified: Secondary | ICD-10-CM | POA: Diagnosis not present

## 2022-08-31 ENCOUNTER — Ambulatory Visit: Payer: BC Managed Care – PPO | Admitting: Psychiatry

## 2022-11-23 ENCOUNTER — Other Ambulatory Visit: Payer: Self-pay

## 2022-11-23 DIAGNOSIS — F39 Unspecified mood [affective] disorder: Secondary | ICD-10-CM

## 2022-11-23 MED ORDER — LAMOTRIGINE 200 MG PO TABS: 200.0000 mg | ORAL_TABLET | Freq: Every day | ORAL | 0 refills | Status: DC

## 2022-11-28 ENCOUNTER — Ambulatory Visit: Payer: BC Managed Care – PPO | Admitting: Psychiatry

## 2022-12-05 ENCOUNTER — Other Ambulatory Visit: Payer: Self-pay

## 2022-12-05 DIAGNOSIS — F39 Unspecified mood [affective] disorder: Secondary | ICD-10-CM

## 2022-12-05 DIAGNOSIS — F419 Anxiety disorder, unspecified: Secondary | ICD-10-CM

## 2022-12-05 MED ORDER — SERTRALINE HCL 100 MG PO TABS: 200.0000 mg | ORAL_TABLET | Freq: Every day | ORAL | 1 refills | Status: DC

## 2022-12-12 ENCOUNTER — Encounter: Payer: Self-pay | Admitting: Psychiatry

## 2022-12-12 ENCOUNTER — Ambulatory Visit (INDEPENDENT_AMBULATORY_CARE_PROVIDER_SITE_OTHER): Payer: BC Managed Care – PPO | Admitting: Psychiatry

## 2022-12-12 DIAGNOSIS — F39 Unspecified mood [affective] disorder: Secondary | ICD-10-CM

## 2022-12-12 DIAGNOSIS — G4700 Insomnia, unspecified: Secondary | ICD-10-CM

## 2022-12-12 DIAGNOSIS — F900 Attention-deficit hyperactivity disorder, predominantly inattentive type: Secondary | ICD-10-CM

## 2022-12-12 DIAGNOSIS — F419 Anxiety disorder, unspecified: Secondary | ICD-10-CM

## 2022-12-12 MED ORDER — JORNAY PM 40 MG PO CP24: 40.0000 mg | ORAL_CAPSULE | Freq: Every evening | ORAL | 0 refills | Status: DC

## 2022-12-12 MED ORDER — LAMOTRIGINE 200 MG PO TABS: 200.0000 mg | ORAL_TABLET | Freq: Every day | ORAL | 1 refills | Status: DC

## 2022-12-12 MED ORDER — SERTRALINE HCL 100 MG PO TABS: 200.0000 mg | ORAL_TABLET | Freq: Every day | ORAL | 1 refills | Status: DC

## 2022-12-12 MED ORDER — TRAZODONE HCL 100 MG PO TABS: ORAL_TABLET | ORAL | 1 refills | Status: DC

## 2022-12-12 MED ORDER — ALPRAZOLAM 0.5 MG PO TABS
0.5000 mg | ORAL_TABLET | Freq: Every day | ORAL | 2 refills | Status: DC | PRN
Start: 2022-12-12 — End: 2023-08-07

## 2022-12-12 NOTE — Progress Notes (Signed)
Joclyn Alsobrook [facility] [id number] [birth year] 32 y.o.  Subjective:   Patient ID:  Dawn King is a 32 y.o. (DOB 1990/12/11) female.  Chief Complaint:  Chief Complaint  Patient presents with   Follow-up    Anxiety, depression, ADHD, and insomnia    HPI Dawn King presents to the office today for follow-up of anxiety, depression, ADHD, and insomnia. She reports that she developed 4 stomach ulcers and then recognized she needed to reduce her stress. She took a 3 week leave of absence from work due to vomiting and noticed she was "much happier." She decided to quit her job that she did not enjoy. 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  She reports, "I have been a 1,000 times happier in the last month." She reports, "I recently accepted that I have autism." She reports improved energy and motivation. She reports that she has been reaching out more socially. Reports improved relationship with her mother.   She reports that she re-started Jornay PM. She reports that it has been helpful for her concentration and productivity. She reports that she previously would not be able to sustain focus past 3 hours, 5 hours at max. She is now able to sustain focus for over 5 hours. She has been doing things she enjoys and has not been able to do recently. Has been around horses. Denies current anxiety or depressed mood. She reports some difficulty falling asleep and finds Trazodone helpful. Appetite remains low. She has been forcing herself to eat some throughout the day. She reports that she has lost weight. Denies SI.   "This is the best I have felt in a decade."   She reports she has had sensory issues throughout her lifetime. She reports that there are certain textures she avoids with foods. She reports an aversion to dry skin.   Has not seen a therapist recently.   Xanax last filled  09/27/22. Jornay Pm last filled 03/07/21  Past Psychiatric Medication Trials: Concerta- Helpful. Started in 5th grade. Took it sporadically about 5-6 years ago and not since. Does not recall side effects. Took 36 mg po qd. Adderall- Had loss of appetite. May have been XR. Sertraline- Has taken since 8th grade. Has been helpful for mood and anxiety. Paxil- Took prior to 8th grade. Lamictal-Started 5 years ago. Has been helpful for depression and mood lability. Hydroxyzine- allergic reaction Xanax- Intermittently effective Valium- prescribed for muscle spasms   Flowsheet Row ED from 06/24/2021 in [facility] at [facility] ED from 11/07/2018 in [facility] [facility] No Risk No Risk        Review of Systems:  Review of Systems  Cardiovascular:  Negative for palpitations.  Gastrointestinal:        Improved GI/Ulcer symptoms  Musculoskeletal:  Negative for gait problem.  Neurological:  Positive for tremors.  Psychiatric/Behavioral:         Please refer to HPI    Medications: I have reviewed the patient's current medications.  Current Outpatient Medications  Medication Sig Dispense Refill   ferrous sulfate 325 (65 FE) MG tablet Take 325 mg by mouth daily with breakfast.     PROBIOTIC, LACTOBACILLUS, PO Take by mouth.     promethazine (PHENERGAN) 25 MG tablet Take 25 mg by mouth every 6 (six) hours as needed for nausea or vomiting.  SUMAtriptan (IMITREX) 50 MG tablet sumatriptan 50 mg tablet     ALPRAZolam (XANAX) 0.5 MG tablet Take 1 tablet (0.5 mg total) by mouth daily as needed for anxiety. 15 tablet 2   lamoTRIgine (LAMICTAL) 200 MG tablet Take 1 tablet (200 mg total) by mouth at bedtime. 90 tablet 1   medroxyPROGESTERone Acetate 150 MG/ML SUSY medroxyprogesterone 150 mg/mL intramuscular syringe  Inject 1 mL every 3 months by intramuscular route. (Patient not taking: Reported on 12/12/2022)      Methylphenidate HCl ER, PM, (JORNAY PM) 40 MG CP24 Take 1 capsule (40 mg total) by mouth every evening. 30 capsule 0   [START ON 01/09/2023] Methylphenidate HCl ER, PM, (JORNAY PM) 40 MG CP24 Take 1 capsule (40 mg total) by mouth every evening. 30 capsule 0   [START ON 02/06/2023] Methylphenidate HCl ER, PM, (JORNAY PM) 40 MG CP24 Take 1 capsule (40 mg total) by mouth every evening. 30 capsule 0   sertraline (ZOLOFT) 100 MG tablet Take 2 tablets (200 mg total) by mouth at bedtime. 180 tablet 1   traZODone (DESYREL) 100 MG tablet Take 1-1.5 tablets po QHS prn insomnia 145 tablet 1   No current facility-administered medications for this visit.    Medication Side Effects: None  Allergies:  Allergies  Allergen Reactions   Levaquin [Levofloxacin] Anaphylaxis and Rash   Cefaclor Hives and Rash    severe rash   Diphenhydramine Hives   Hydroxyzine Hives   Percocet [Oxycodone-Acetaminophen] Hives    12/19/18: patient reports that she tolerates Oxycodone (but not Percocet)   Sulfonamide Derivatives Hives and Rash    severe rash   Tramadol Nausea And Vomiting   Carafate [Sucralfate] Nausea And Vomiting   Other Itching    *Cantoloupe* makes mouth really itchy    Sunflower Oil Itching    Mouth really itchy    Clindamycin/Lincomycin Rash   Codeine Rash   Keflex [Cephalexin] Rash   Morphine And Related Rash   Zofran [Ondansetron] Rash    Past Medical History:  Diagnosis Date   Anxiety    Depression    Family history of adverse reaction to anesthesia    maternal aunt had difficulty waken up.    Fibromyalgia    GERD (gastroesophageal reflux disease)    H/O multiple concussions    no residual   Heart murmur    as a child; had a heart defect. outgrew it   Hypothyroidism    no meds for slightlty below normal    Pelvic pain    Pneumonia last time 2014   PONV (postoperative nausea and vomiting)    with sx age 35 none since   Trauma 09/2010   cracked liver from being "stomped on by a  horse".    Past Medical History, Surgical history, Social history, and Family history were reviewed and updated as appropriate.   Please see review of systems for further details on the patient's review from today.   Objective:   Physical Exam:  BP 118/62   Pulse 83   Physical Exam Constitutional:      General: She is not in acute distress. Musculoskeletal:        General: No deformity.  Neurological:     Mental Status: She is alert and oriented to person, place, and time.     Coordination: Coordination normal.  Psychiatric:        Attention and Perception: Attention and perception normal. She does not perceive auditory or visual hallucinations.  Mood and Affect: Mood normal. Mood is not anxious or depressed. Affect is not labile, blunt, angry or inappropriate.        Speech: Speech normal.        Behavior: Behavior normal.        Thought Content: Thought content normal. Thought content is not paranoid or delusional. Thought content does not include homicidal or suicidal ideation. Thought content does not include homicidal or suicidal plan.        Cognition and Memory: Cognition and memory normal.        Judgment: Judgment normal.     Comments: Insight intact     Lab Review:     Component Value Date/Time   NA 137 06/24/2021 2339   K 4.0 06/24/2021 2339   CL 103 06/24/2021 2339   CO2 25 06/24/2021 2339   GLUCOSE 119 (H) 06/24/2021 2339   BUN 9 06/24/2021 2339   CREATININE 0.64 06/24/2021 2339   CALCIUM 9.4 06/24/2021 2339   PROT 7.2 06/24/2021 2339   ALBUMIN 4.0 06/24/2021 2339   AST 12 (L) 06/24/2021 2339   ALT 15 06/24/2021 2339   ALKPHOS 88 06/24/2021 2339   BILITOT 0.3 06/24/2021 2339   GFRNONAA >60 06/24/2021 2339   GFRAA >60 08/01/2020 1107       Component Value Date/Time   WBC 11.0 (H) 05/17/2022 1302   WBC 12.4 (H) 06/24/2021 2339   RBC 5.17 (H) 05/17/2022 1302   HGB 12.0 05/17/2022 1302   HCT 38.1 05/17/2022 1302   PLT 379 05/17/2022 1302    MCV 73.7 (L) 05/17/2022 1302   MCH 23.2 (L) 05/17/2022 1302   MCHC 31.5 05/17/2022 1302   RDW 17.5 (H) 05/17/2022 1302   LYMPHSABS 2.6 05/17/2022 1302   MONOABS 0.7 05/17/2022 1302   EOSABS 0.0 05/17/2022 1302   BASOSABS 0.0 05/17/2022 1302    No results found for: "POCLITH", "LITHIUM"   No results found for: "PHENYTOIN", "PHENOBARB", "VALPROATE", "CBMZ"   .res Assessment: Plan:    Will continue current plan of care since target signs and symptoms are well controlled without any tolerability issues. Continue sertraline 200 mg daily for anxiety and depression. Continue Jornay PM 40 mg every evening for ADHD. Continue alprazolam 0.5 mg as needed for anxiety. Continue lamotrigine 200 mg daily for mood symptoms. Continue trazodone 100-150 mg po QHS prn insomnia.    Pt to follow-up in 6 months or sooner if clinically indicated.  Requested pt call in 3 months to provide update and request additional scripts.  Patient advised to contact office with any questions, adverse effects, or acute worsening in signs and symptoms.   Shakthi was seen today for follow-up.  Diagnoses and all orders for this visit:  Attention deficit hyperactivity disorder (ADHD), predominantly inattentive type -     Methylphenidate HCl ER, PM, (JORNAY PM) 40 MG CP24; Take 1 capsule (40 mg total) by mouth every evening. -     Methylphenidate HCl ER, PM, (JORNAY PM) 40 MG CP24; Take 1 capsule (40 mg total) by mouth every evening. -     Methylphenidate HCl ER, PM, (JORNAY PM) 40 MG CP24; Take 1 capsule (40 mg total) by mouth every evening.  Anxiety disorder, unspecified type -     ALPRAZolam (XANAX) 0.5 MG tablet; Take 1 tablet (0.5 mg total) by mouth daily as needed for anxiety. -     sertraline (ZOLOFT) 100 MG tablet; Take 2 tablets (200 mg total) by mouth at bedtime.  Episodic mood disorder (  [place]) -     lamoTRIgine (LAMICTAL) 200 MG tablet; Take 1 tablet (200 mg total) by mouth at bedtime. -     sertraline  (ZOLOFT) 100 MG tablet; Take 2 tablets (200 mg total) by mouth at bedtime.  Insomnia, unspecified type -     traZODone (DESYREL) 100 MG tablet; Take 1-1.5 tablets po QHS prn insomnia     Please see After Visit Summary for patient specific instructions.  Future Appointments  Date Time Provider [place]  06/12/2023  9:30 AM Thayer Headings, PMHNP [facility] None     No orders of the defined types were placed in this encounter.   -------------------------------

## 2023-06-12 ENCOUNTER — Ambulatory Visit (INDEPENDENT_AMBULATORY_CARE_PROVIDER_SITE_OTHER): Payer: BC Managed Care – PPO | Admitting: Psychiatry

## 2023-06-12 DIAGNOSIS — Z91199 Patient's noncompliance with other medical treatment and regimen due to unspecified reason: Secondary | ICD-10-CM

## 2023-06-13 NOTE — Progress Notes (Signed)
Patient did not show for scheduled office visit.   

## 2023-07-25 ENCOUNTER — Telehealth: Payer: Self-pay | Admitting: Psychiatry

## 2023-07-25 DIAGNOSIS — F39 Unspecified mood [affective] disorder: Secondary | ICD-10-CM

## 2023-07-25 MED ORDER — LAMOTRIGINE 200 MG PO TABS: 200.0000 mg | ORAL_TABLET | Freq: Every day | ORAL | 1 refills | Status: DC

## 2023-07-25 NOTE — Telephone Encounter (Signed)
Pt called to request refill of Lamotrigine to   [id number] Ginette Otto, [place] - [address] AT [facility] [address] & [facility] [address] [place] [address] Phone: [phone]  Fax: [phone]    Next appt 9/19

## 2023-07-25 NOTE — Telephone Encounter (Signed)
Sent!

## 2023-08-01 ENCOUNTER — Ambulatory Visit: Payer: Medicaid Other | Admitting: Psychiatry

## 2023-08-07 ENCOUNTER — Other Ambulatory Visit: Payer: Self-pay

## 2023-08-07 DIAGNOSIS — F419 Anxiety disorder, unspecified: Secondary | ICD-10-CM

## 2023-08-07 MED ORDER — ALPRAZOLAM 0.5 MG PO TABS: 0.5000 mg | ORAL_TABLET | Freq: Every day | ORAL | 2 refills | Status: DC | PRN

## 2023-08-09 ENCOUNTER — Encounter: Payer: Self-pay | Admitting: Psychiatry

## 2023-08-09 ENCOUNTER — Telehealth (INDEPENDENT_AMBULATORY_CARE_PROVIDER_SITE_OTHER): Payer: Medicaid Other | Admitting: Psychiatry

## 2023-08-09 DIAGNOSIS — F419 Anxiety disorder, unspecified: Secondary | ICD-10-CM

## 2023-08-09 DIAGNOSIS — F900 Attention-deficit hyperactivity disorder, predominantly inattentive type: Secondary | ICD-10-CM | POA: Diagnosis not present

## 2023-08-09 DIAGNOSIS — G4700 Insomnia, unspecified: Secondary | ICD-10-CM

## 2023-08-09 DIAGNOSIS — F39 Unspecified mood [affective] disorder: Secondary | ICD-10-CM | POA: Diagnosis not present

## 2023-08-09 MED ORDER — JORNAY PM 40 MG PO CP24: 40.0000 mg | ORAL_CAPSULE | Freq: Every evening | ORAL | 0 refills | Status: AC

## 2023-08-09 MED ORDER — TRAZODONE HCL 100 MG PO TABS: ORAL_TABLET | ORAL | 1 refills | Status: DC

## 2023-08-09 MED ORDER — SERTRALINE HCL 100 MG PO TABS: 200.0000 mg | ORAL_TABLET | Freq: Every day | ORAL | 1 refills | Status: DC

## 2023-08-09 NOTE — Progress Notes (Signed)
[facility] [id number] 08-04-1991 32 y.o.  Virtual Visit via Video Note  I connected with pt @ on 08/09/23 at 10:00 AM EDT by a video enabled telemedicine application and verified that I am speaking with the correct person using two identifiers.   I discussed the limitations of evaluation and management by telemedicine and the availability of in person appointments. The patient expressed understanding and agreed to proceed.  I discussed the assessment and treatment plan with the patient. The patient was provided an opportunity to ask questions and all were answered. The patient agreed with the plan and demonstrated an understanding of the instructions.   The patient was advised to call back or seek an in-person evaluation if the symptoms worsen or if the condition fails to improve as anticipated.  I provided 28 minutes of non-face-to-face time during this encounter.  The patient was located at home.  The provider was located at [facility].   Corie Chiquito, PMHNP   Subjective:   Patient ID:  Dawn King is a 32 y.o. (DOB 1991-02-04) female.  Chief Complaint:  Chief Complaint  Patient presents with   Follow-up    Anxiety, depression, and ADHD    HPI Dawn King presents for follow-up of anxiety, depression, and ADHD. Reports that she had some issues with insurance and this caused a lapse in her medication. She reports that she "stretched out" Lamictal for a period of time and then was off for 5 days and re-start Lamictal 200 mg daily about a week ago. She reports that her mood and sleep were worse without Lamictal and these symptoms have improved. She reports that she has been sleeping well. She reports that her mood has been improving and she felt on the verge of crying this morning without knowing why or feeling sad. She reports recent irritability around the time she ran out of Lamictal. She reports one day in the last few months she had  severe anxiety after the loss of grandfather and others turning to her for support. Energy is ok. She reports difficulty with motivation and is distracted at times by wanting to do something more enjoyable. Difficulty with concentration. Appetite has been ok. Denies SI.   Alprazolam last filled 03/03/23.  Reports that she has had a job since she was 32 yo. She reports that she has her first job where she is setting her own schedule and this has been an adjustment. She reports that her supervisor is very supportive.   She reports, "my mother has decided that I am buying a house next year." She reports that this causes her some stress. She is considering getting a roommate so that she does not isolate.  Mother is in her 64's. Mother and step-father will likely move to retirement facility in the future.   Past Psychiatric Medication Trials: Concerta- Helpful. Started in 5th grade. Took it sporadically about 5-6 years ago and not since. Does not recall side effects. Took 36 mg po qd. Adderall- Had loss of appetite. May have been XR. Sertraline- Has taken since 8th grade. Has been helpful for mood and anxiety. Paxil- Took prior to 8th grade. Lamictal-Started 5 years ago. Has been helpful for depression and mood lability. Hydroxyzine- allergic reaction Xanax- Intermittently effective Valium- prescribed for muscle spasms  Review of Systems:  Review of Systems  Musculoskeletal:  Negative for gait problem.  Skin:  Negative for rash.  Neurological:  Negative for tremors and headaches.  Psychiatric/Behavioral:  Please refer to HPI    Medications: I have reviewed the patient's current medications.  Current Outpatient Medications  Medication Sig Dispense Refill   ALPRAZolam (XANAX) 0.5 MG tablet Take 1 tablet (0.5 mg total) by mouth daily as needed for anxiety. 15 tablet 2   lamoTRIgine (LAMICTAL) 200 MG tablet Take 1 tablet (200 mg total) by mouth at bedtime. 90 tablet 1   PROBIOTIC,  LACTOBACILLUS, PO Take by mouth.     promethazine (PHENERGAN) 25 MG tablet Take 25 mg by mouth every 6 (six) hours as needed for nausea or vomiting.     SUMAtriptan (IMITREX) 50 MG tablet sumatriptan 50 mg tablet     ferrous sulfate 325 (65 FE) MG tablet Take 325 mg by mouth daily with breakfast. (Patient not taking: Reported on 08/09/2023)     medroxyPROGESTERone Acetate 150 MG/ML SUSY medroxyprogesterone 150 mg/mL intramuscular syringe  Inject 1 mL every 3 months by intramuscular route. (Patient not taking: Reported on 12/12/2022)     [START ON 10/04/2023] Methylphenidate HCl ER, PM, (JORNAY PM) 40 MG CP24 Take 1 capsule (40 mg total) by mouth every evening. 30 capsule 0   Methylphenidate HCl ER, PM, (JORNAY PM) 40 MG CP24 Take 1 capsule (40 mg total) by mouth every evening. 30 capsule 0   [START ON 09/06/2023] Methylphenidate HCl ER, PM, (JORNAY PM) 40 MG CP24 Take 1 capsule (40 mg total) by mouth every evening. 30 capsule 0   sertraline (ZOLOFT) 100 MG tablet Take 2 tablets (200 mg total) by mouth at bedtime. 180 tablet 1   traZODone (DESYREL) 100 MG tablet Take 1-1.5 tablets po QHS prn insomnia 145 tablet 1   No current facility-administered medications for this visit.    Medication Side Effects: None  Allergies:  Allergies  Allergen Reactions   Levaquin [Levofloxacin] Anaphylaxis and Rash   Cefaclor Hives and Rash    severe rash   Diphenhydramine Hives   Hydroxyzine Hives   Percocet [Oxycodone-Acetaminophen] Hives    12/19/18: patient reports that she tolerates Oxycodone (but not Percocet)   Sulfonamide Derivatives Hives and Rash    severe rash   Tramadol Nausea And Vomiting   Carafate [Sucralfate] Nausea And Vomiting   Other Itching    *Cantoloupe* makes mouth really itchy    Sunflower Oil Itching    Mouth really itchy    Clindamycin/Lincomycin Rash   Codeine Rash   Keflex [Cephalexin] Rash   Morphine And Codeine Rash   Zofran [Ondansetron] Rash    Past Medical History:   Diagnosis Date   Anxiety    Depression    Family history of adverse reaction to anesthesia    maternal aunt had difficulty waken up.    Fibromyalgia    GERD (gastroesophageal reflux disease)    H/O multiple concussions    no residual   Heart murmur    as a child; had a heart defect. outgrew it   Hypothyroidism    no meds for slightlty below normal    Pelvic pain    Pneumonia last time 2014   PONV (postoperative nausea and vomiting)    with sx age 53 none since   Trauma 09/2010   cracked liver from being "stomped on by a horse".    Family History  Problem Relation Age of Onset   Depression Father    Depression Sister    Anxiety disorder Sister    Depression Paternal Uncle    Autism Cousin    Autism Cousin  Social History   Socioeconomic History   Marital status: Single    Spouse name: Not on file   Number of children: Not on file   Years of education: Not on file   Highest education level: Not on file  Occupational History   Not on file  Tobacco Use   Smoking status: Former    Current packs/day: 0.00    Types: Cigarettes    Start date: 08/13/2015    Quit date: 08/12/2018    Years since quitting: 4.9   Smokeless tobacco: Never   Tobacco comments:    social  Vaping Use   Vaping status: Some Days   Last attempt to quit: 11/12/2017   Substances: THC  Substance and Sexual Activity   Alcohol use: Yes    Comment: rare   Drug use: Yes    Types: Marijuana    Comment: occ marijuanana last used 07-27-2020   Sexual activity: Not on file  Other Topics Concern   Not on file  Social History Narrative   Not on file   Social Determinants of Health   Financial Resource Strain: Not on file  Food Insecurity: Not on file  Transportation Needs: Not on file  Physical Activity: Not on file  Stress: Not on file  Social Connections: Unknown (03/12/2022)   Received from [facility]   Social Network    Social Network: Not on file  Intimate Partner  Violence: Unknown (02/16/2022)   Received [facility]   HITS    Physically Hurt: Not on file    Insult or Talk Down To: Not on file    Threaten Physical Harm: Not on file    Scream or Curse: Not on file    Past Medical History, Surgical history, Social history, and Family history were reviewed and updated as appropriate.   Please see review of systems for further details on the patient's review from today.   Objective:   Physical Exam:  There were no vitals taken for this visit.  Physical Exam Neurological:     Mental Status: She is alert and oriented to person, place, and time.     Cranial Nerves: No dysarthria.  Psychiatric:        Attention and Perception: Attention and perception normal.        Mood and Affect: Mood normal.        Speech: Speech normal.        Behavior: Behavior is cooperative.        Thought Content: Thought content normal. Thought content is not paranoid or delusional. Thought content does not include homicidal or suicidal ideation. Thought content does not include homicidal or suicidal plan.        Cognition and Memory: Cognition and memory normal.        Judgment: Judgment normal.     Comments: Insight intact     Lab Review:     Component Value Date/Time   NA 137 06/24/2021 2339   K 4.0 06/24/2021 2339   CL 103 06/24/2021 2339   CO2 25 06/24/2021 2339   GLUCOSE 119 (H) 06/24/2021 2339   BUN 9 06/24/2021 2339   CREATININE 0.64 06/24/2021 2339   CALCIUM 9.4 06/24/2021 2339   PROT 7.2 06/24/2021 2339   ALBUMIN 4.0 06/24/2021 2339   AST 12 (L) 06/24/2021 2339   ALT 15 06/24/2021 2339   ALKPHOS 88 06/24/2021 2339   BILITOT 0.3 06/24/2021 2339   GFRNONAA >60 06/24/2021 2339  GFRAA >60 08/01/2020 1107       Component Value Date/Time   WBC 11.0 (H) 05/17/2022 1302   WBC 12.4 (H) 06/24/2021 2339   RBC 5.17 (H) 05/17/2022 1302   HGB 12.0 05/17/2022 1302   HCT 38.1 05/17/2022 1302   PLT 379 05/17/2022 1302   MCV 73.7  (L) 05/17/2022 1302   MCH 23.2 (L) 05/17/2022 1302   MCHC 31.5 05/17/2022 1302   RDW 17.5 (H) 05/17/2022 1302   LYMPHSABS 2.6 05/17/2022 1302   MONOABS 0.7 05/17/2022 1302   EOSABS 0.0 05/17/2022 1302   BASOSABS 0.0 05/17/2022 1302    No results found for: "POCLITH", "LITHIUM"   No results found for: "PHENYTOIN", "PHENOBARB", "VALPROATE", "CBMZ"   .res Assessment: Plan:   32 minutes spent dedicated to the care of this patient on the date of this encounter to include pre-visit review of records, ordering of medication, post visit documentation, and face-to-face time with the patient discussing that Ophelia Charter PM appears to be covered under her insurance with Prior Authorization. Will attempt to re-start Jornay PM 40 mg every evening for ADHD.  Continue Lamictal 200 mg daily for mood disorder.  Continue Sertraline 200 mg daily for depression and anxiety.  Continue Trazodone 100-150 mg at bedtime prn.  Continue Alprazolam prn every day prn anxiety. Pt to follow-up in 6 months or sooner if clinically indicated.  Patient advised to contact office with any questions, adverse effects, or acute worsening in signs and symptoms.   Dawn King was seen today for follow-up.  Diagnoses and all orders for this visit:  Attention deficit hyperactivity disorder (ADHD), predominantly inattentive type -     Methylphenidate HCl ER, PM, (JORNAY PM) 40 MG CP24; Take 1 capsule (40 mg total) by mouth every evening. -     Methylphenidate HCl ER, PM, (JORNAY PM) 40 MG CP24; Take 1 capsule (40 mg total) by mouth every evening. -     Methylphenidate HCl ER, PM, (JORNAY PM) 40 MG CP24; Take 1 capsule (40 mg total) by mouth every evening.  Anxiety disorder, unspecified type -     sertraline (ZOLOFT) 100 MG tablet; Take 2 tablets (200 mg total) by mouth at bedtime.  Episodic mood disorder (HCC) -     sertraline (ZOLOFT) 100 MG tablet; Take 2 tablets (200 mg total) by mouth at bedtime.  Insomnia, unspecified type -      traZODone (DESYREL) 100 MG tablet; Take 1-1.5 tablets po QHS prn insomnia     Please see After Visit Summary for patient specific instructions.  No future appointments.   No orders of the defined types were placed in this encounter.     -------------------------------

## 2023-08-11 ENCOUNTER — Telehealth: Payer: Self-pay

## 2023-08-18 NOTE — Telephone Encounter (Signed)
Prior Authorization Jornay 40 #30/30 [employer]  Approved Effective:  07/28/23-08/10/24

## 2023-09-25 ENCOUNTER — Encounter: Payer: Self-pay | Admitting: Psychiatry

## 2023-12-20 ENCOUNTER — Telehealth: Payer: Self-pay | Admitting: Psychiatry

## 2023-12-20 DIAGNOSIS — F39 Unspecified mood [affective] disorder: Secondary | ICD-10-CM

## 2023-12-20 DIAGNOSIS — F419 Anxiety disorder, unspecified: Secondary | ICD-10-CM

## 2023-12-20 MED ORDER — SERTRALINE HCL 100 MG PO TABS: 200.0000 mg | ORAL_TABLET | Freq: Every day | ORAL | 0 refills | Status: DC

## 2023-12-20 NOTE — Telephone Encounter (Signed)
 Patient called for refill on Setraline 100mg . She was seeing JC and will be seeing BW on 3/4. PH: [phone] Pharmacy [facility] [address] [place]

## 2023-12-20 NOTE — Telephone Encounter (Signed)
 Sent Sertraline  200 mg to [facility].

## 2023-12-27 ENCOUNTER — Encounter (HOSPITAL_BASED_OUTPATIENT_CLINIC_OR_DEPARTMENT_OTHER): Payer: Self-pay

## 2023-12-27 ENCOUNTER — Emergency Department (HOSPITAL_BASED_OUTPATIENT_CLINIC_OR_DEPARTMENT_OTHER)
Admission: EM | Admit: 2023-12-27 | Discharge: 2023-12-27 | Disposition: A | Discharge status: Home / Self Care | Payer: Medicaid Other | Attending: Emergency Medicine | Admitting: Emergency Medicine

## 2023-12-27 ENCOUNTER — Other Ambulatory Visit: Payer: Self-pay

## 2023-12-27 ENCOUNTER — Other Ambulatory Visit (HOSPITAL_BASED_OUTPATIENT_CLINIC_OR_DEPARTMENT_OTHER): Payer: Self-pay

## 2023-12-27 ENCOUNTER — Emergency Department (HOSPITAL_BASED_OUTPATIENT_CLINIC_OR_DEPARTMENT_OTHER): Payer: Medicaid Other

## 2023-12-27 DIAGNOSIS — R1084 Generalized abdominal pain: Secondary | ICD-10-CM | POA: Diagnosis not present

## 2023-12-27 DIAGNOSIS — R1110 Vomiting, unspecified: Secondary | ICD-10-CM | POA: Diagnosis present

## 2023-12-27 LAB — COMPREHENSIVE METABOLIC PANEL
ALT: 9 U/L (ref 0–44)
AST: 10 U/L — ABNORMAL LOW (ref 15–41)
Albumin: 4.2 g/dL (ref 3.5–5.0)
Alkaline Phosphatase: 68 U/L (ref 38–126)
Anion gap: 10 (ref 5–15)
BUN: 8 mg/dL (ref 6–20)
CO2: 25 mmol/L (ref 22–32)
Calcium: 9.2 mg/dL (ref 8.9–10.3)
Chloride: 103 mmol/L (ref 98–111)
Creatinine, Ser: 0.73 mg/dL (ref 0.44–1.00)
GFR, Estimated: >60 mL/min (ref >60)
Glucose, Bld: 95 mg/dL (ref 70–99)
Potassium: 4.5 mmol/L (ref 3.5–5.1)
Sodium: 138 mmol/L (ref 135–145)
Total Bilirubin: 0.9 mg/dL (ref 0.0–1.2)
Total Protein: 7.6 g/dL (ref 6.5–8.1)

## 2023-12-27 LAB — URINALYSIS, ROUTINE W REFLEX MICROSCOPIC
Bilirubin Urine: NEGATIVE
Glucose, UA: NEGATIVE mg/dL
Ketones, ur: NEGATIVE mg/dL
Leukocytes,Ua: NEGATIVE
Nitrite: NEGATIVE
Protein, ur: NEGATIVE mg/dL
Specific Gravity, Urine: 1.009 (ref 1.005–1.030)
pH: 5.5 (ref 5.0–8.0)

## 2023-12-27 LAB — LIPASE, BLOOD: Lipase: <10 U/L — ABNORMAL LOW (ref 11–51)

## 2023-12-27 LAB — CBC
HCT: 43.5 % (ref 36.0–46.0)
Hemoglobin: 13.9 g/dL (ref 12.0–15.0)
MCH: 25.6 pg — ABNORMAL LOW (ref 26.0–34.0)
MCHC: 32 g/dL (ref 30.0–36.0)
MCV: 80.1 fL (ref 80.0–100.0)
Platelets: 361 10*3/uL (ref 150–400)
RBC: 5.43 MIL/uL — ABNORMAL HIGH (ref 3.87–5.11)
RDW: 15.8 % — ABNORMAL HIGH (ref 11.5–15.5)
WBC: 8.7 10*3/uL (ref 4.0–10.5)
nRBC: 0 % (ref 0.0–0.2)

## 2023-12-27 LAB — PREGNANCY, URINE: Preg Test, Ur: NEGATIVE

## 2023-12-27 MED ORDER — OXYCODONE HCL 5 MG PO TABS
5.0000 mg | ORAL_TABLET | Freq: Four times a day (QID) | ORAL | 0 refills | Status: DC | PRN
  Filled 2023-12-27: qty 6, 2d supply, fill #0

## 2023-12-27 MED ORDER — SODIUM CHLORIDE 0.9 % IV BOLUS
1000.0000 mL | Freq: Once | INTRAVENOUS | Status: AC
  Administered 2023-12-27: 1000 mL via INTRAVENOUS

## 2023-12-27 MED ORDER — IOHEXOL 300 MG/ML  SOLN
100.0000 mL | Freq: Once | INTRAMUSCULAR | Status: AC | PRN
Start: 2023-12-27 — End: 2023-12-27
  Administered 2023-12-27: 100 mL via INTRAVENOUS

## 2023-12-27 MED ORDER — HYDROMORPHONE HCL 1 MG/ML IJ SOLN
0.5000 mg | Freq: Once | INTRAMUSCULAR | Status: AC
  Administered 2023-12-27: 0.5 mg via INTRAVENOUS
  Filled 2023-12-27: qty 1

## 2023-12-27 MED ORDER — HYDROCODONE-ACETAMINOPHEN 5-325 MG PO TABS
1.0000 | ORAL_TABLET | Freq: Four times a day (QID) | ORAL | 0 refills | Status: AC | PRN
Start: 2023-12-27 — End: ?
  Filled 2023-12-27: qty 6, 2d supply, fill #0

## 2023-12-27 MED ORDER — SODIUM CHLORIDE 0.9 % IV SOLN
25.0000 mg | Freq: Four times a day (QID) | INTRAVENOUS | Status: DC | PRN
  Administered 2023-12-27: 25 mg via INTRAVENOUS
  Filled 2023-12-27: qty 1

## 2023-12-27 MED ORDER — PROMETHAZINE HCL 25 MG/ML IJ SOLN
INTRAMUSCULAR | Status: AC
  Filled 2023-12-27: qty 1

## 2023-12-27 NOTE — ED Triage Notes (Signed)
Onset yesterday of vomiting and diarrhea.  Abdominal pain stabbing throughout abd.

## 2023-12-27 NOTE — Discharge Instructions (Signed)
Please read and follow all provided instructions.  Your diagnoses today include:  1. Generalized abdominal pain     Tests performed today include: Blood cell counts and platelets Kidney and liver function tests Pancreas function test (called lipase) Urine test to look for infection A blood or urine test for pregnancy (women only) CT scan of the abdomen and pelvis: shows slightly enlarged spleen and minimal extra fluid in the right lung Vital signs. See below for your results today.   Medications prescribed:  Oxycodone - narcotic pain medication  DO NOT drive or perform any activities that require you to be awake and alert because this medicine can make you drowsy.   Take any prescribed medications only as directed.  Home care instructions:  Follow any educational materials contained in this packet.  Follow-up instructions: Please follow-up with your primary care provider in the next 3 days for further evaluation of your symptoms.    Return instructions:  SEEK IMMEDIATE MEDICAL ATTENTION IF: The pain does not go away or becomes severe  A temperature above 101F develops  Repeated vomiting occurs (multiple episodes)  The pain becomes localized to portions of the abdomen. The right side could possibly be appendicitis. In an adult, the left lower portion of the abdomen could be colitis or diverticulitis.  Blood is being passed in stools or vomit (bright red or black tarry stools)  You develop chest pain, difficulty breathing, dizziness or fainting, or become confused, poorly responsive, or inconsolable (young children) If you have any other emergent concerns regarding your health  Additional Information: Abdominal (belly) pain can be caused by many things. Your caregiver performed an examination and possibly ordered blood/urine tests and imaging (CT scan, x-rays, ultrasound). Many cases can be observed and treated at home after initial evaluation in the emergency department. Even  though you are being discharged home, abdominal pain can be unpredictable. Therefore, you need a repeated exam if your pain does not resolve, returns, or worsens. Most patients with abdominal pain don't have to be admitted to the hospital or have surgery, but serious problems like appendicitis and gallbladder attacks can start out as nonspecific pain. Many abdominal conditions cannot be diagnosed in one visit, so follow-up evaluations are very important.  Your vital signs today were: BP 118/62   Pulse 77   Temp 98.4 F (36.9 C) (Oral)   Resp (!) 22   Ht 5\' 5"  (1.651 m)   Wt 120.2 kg   LMP 11/25/2023 (Approximate)   SpO2 99%   BMI 44.10 kg/m  If your blood pressure (bp) was elevated above 135/85 this visit, please have this repeated by your doctor within one month. --------------

## 2023-12-27 NOTE — ED Notes (Signed)
Patient returned from CT, questioned about episode. Patient stated "I was in pain and flailing in floor, I flung my arm over and rolled out of the bed and fell on the floor." Mother was in room and witnessed incident. Pt and mother both state patient did not hit head and landed on hands and knees on the floor. Pt denies any new symptoms or pain related to fall. Patient skin assessed w assistance of Cortney, Charge RN, small red spot noted to left knee, no deformity noted, pt denies complaint related to knee. EDP to assess pt bedside.

## 2023-12-27 NOTE — ED Notes (Signed)
Pt given discharge instructions and reviewed prescriptions. Opportunities given for questions. Pt verbalizes understanding. PIV removed x1. Jillyn Hidden, RN

## 2023-12-27 NOTE — ED Notes (Signed)
Patient's mother called out, upon entering room pt was on knees in floor, crying and screaming. Mother said the pt was previously fine and then all of a sudden started flailing and got into floor. This RN attempted to ask patient questions but pt would not answer and continued crying. Pt then got up and crawled back into bed. Mother said the pt's pain has increased, pt then verbalized pain score. Patient's vital signs updated and EDP notified. Orders for pain medication received. See MAR. Comfort measures implemented and patient no longer in distress. Pt stable for transport to CT.

## 2023-12-27 NOTE — ED Provider Notes (Signed)
[place] EMERGENCY DEPARTMENT AT [facility] Provider Note   CSN: [id number] Arrival date & time: 12/27/23  1009     History  Chief Complaint  Patient presents with   Abdominal Pain    Dawn King is a 32 y.o. female.  Patient with h/o laproscopy for endometreosis, no other surgery -- presents for eval abd x 2 days with N/V/D. Vomiting all night last night. Stool just water at this point. No fever. No CP/SOB. No hematuria or irritative UTI symptoms including dysuria, increased frequency or urgency. Taking OTC meds without relief. Also tried omeprazole and phenergan. Pain worse lower yesterday and bilateral. Today worse upper and bilateral.        Home Medications Prior to Admission medications   Medication Sig Start Date End Date Taking? Authorizing Provider  ALPRAZolam Prudy Feeler) 0.5 MG tablet Take 1 tablet (0.5 mg total) by mouth daily as needed for anxiety. 08/07/23   Corie Chiquito, PMHNP  ferrous sulfate 325 (65 FE) MG tablet Take 325 mg by mouth daily with breakfast. Patient not taking: Reported on 08/09/2023    [provider]  lamoTRIgine (LAMICTAL) 200 MG tablet Take 1 tablet (200 mg total) by mouth at bedtime. 07/25/23   Corie Chiquito, PMHNP  medroxyPROGESTERone Acetate 150 MG/ML SUSY medroxyprogesterone 150 mg/mL intramuscular syringe  Inject 1 mL every 3 months by intramuscular route. Patient not taking: Reported on 12/12/2022    [provider]  Methylphenidate HCl ER, PM, (JORNAY PM) 40 MG CP24 Take 1 capsule (40 mg total) by mouth every evening. 10/04/23   Corie Chiquito, PMHNP  Methylphenidate HCl ER, PM, (JORNAY PM) 40 MG CP24 Take 1 capsule (40 mg total) by mouth every evening. 08/09/23   Corie Chiquito, PMHNP  Methylphenidate HCl ER, PM, (JORNAY PM) 40 MG CP24 Take 1 capsule (40 mg total) by mouth every evening. 09/06/23   Corie Chiquito, PMHNP  PROBIOTIC, LACTOBACILLUS, PO Take by mouth.    [provider]   promethazine (PHENERGAN) 25 MG tablet Take 25 mg by mouth every 6 (six) hours as needed for nausea or vomiting.    [provider]  sertraline (ZOLOFT) 100 MG tablet Take 2 tablets (200 mg total) by mouth at bedtime. 12/20/23 01/19/24  Joan Flores, NP  SUMAtriptan (IMITREX) 50 MG tablet sumatriptan 50 mg tablet    [provider]  traZODone (DESYREL) 100 MG tablet Take 1-1.5 tablets po QHS prn insomnia 08/09/23   Corie Chiquito, PMHNP      Allergies    Levaquin [levofloxacin], Cefaclor, Diphenhydramine, Hydroxyzine, Percocet [oxycodone-acetaminophen], Sulfonamide derivatives, Tramadol, Carafate [sucralfate], Other, Sunflower oil, Clindamycin/lincomycin, Codeine, Keflex [cephalexin], Morphine and codeine, and Zofran [ondansetron]    Review of Systems   Review of Systems  Physical Exam Updated Vital Signs BP 128/86 (BP Location: Right Arm)   Pulse 87   Temp 98.4 F (36.9 C) (Oral)   Resp 20   Ht 5\' 5"  (1.651 m)   Wt 120.2 kg   LMP 11/25/2023 (Approximate)   SpO2 97%   BMI 44.10 kg/m  Physical Exam Vitals and nursing note reviewed.  Constitutional:      General: She is in acute distress (tearful).     Appearance: She is well-developed.  HENT:     Head: Normocephalic and atraumatic.  Eyes:     Conjunctiva/sclera: Conjunctivae normal.  Pulmonary:     Effort: No respiratory distress.  Abdominal:     Tenderness: There is generalized abdominal tenderness. There is no guarding or rebound.  Negative signs include Murphy's sign, Rovsing's sign and McBurney's sign.  Musculoskeletal:     Cervical back: Normal range of motion and neck supple.  Skin:    General: Skin is warm and dry.  Neurological:     Mental Status: She is alert.     ED Results / Procedures / Treatments   Labs (all labs ordered are listed, but only abnormal results are displayed) Labs Reviewed  LIPASE, BLOOD - Abnormal; Notable for the following components:      Result Value   Lipase <10 (*)     All other components within normal limits  COMPREHENSIVE METABOLIC PANEL - Abnormal; Notable for the following components:   AST 10 (*)    All other components within normal limits  CBC - Abnormal; Notable for the following components:   RBC 5.43 (*)    MCH 25.6 (*)    RDW 15.8 (*)    All other components within normal limits  URINALYSIS, ROUTINE W REFLEX MICROSCOPIC - Abnormal; Notable for the following components:   Hgb urine dipstick TRACE (*)    Bacteria, UA RARE (*)    All other components within normal limits  PREGNANCY, URINE    EKG EKG Interpretation Date/Time:  Friday December 27 2023 10:23:29 EST Ventricular Rate:  87 PR Interval:  166 QRS Duration:  70 QT Interval:  358 QTC Calculation: 430 R Axis:   65  Text Interpretation: Normal sinus rhythm Low voltage QRS  no acute ST/T changes Confirmed by Pricilla Loveless [phone]) on 12/27/2023 11:52:09 AM  Radiology CT ABDOMEN PELVIS W CONTRAST Result Date: 12/27/2023 CLINICAL DATA:  Abdominal pain with nausea, vomiting, and diarrhea. EXAM: CT ABDOMEN AND PELVIS WITH CONTRAST TECHNIQUE: Multidetector CT imaging of the abdomen and pelvis was performed using the standard protocol following bolus administration of intravenous contrast. RADIATION DOSE REDUCTION: This exam was performed according to the departmental dose-optimization program which includes automated exposure control, adjustment of the mA and/or kV according to patient size and/or use of iterative reconstruction technique. CONTRAST:  OMNIPAQUE IOHEXOL 300 MG/ML  SOLN COMPARISON:  CT abdomen pelvis dated July 07, 2020. FINDINGS: Lower chest: Trace right pleural effusion. Hepatobiliary: No focal liver abnormality is seen. No gallstones, gallbladder wall thickening, or biliary dilatation. Pancreas: Unremarkable. No pancreatic ductal dilatation or surrounding inflammatory changes. Spleen: New mild splenomegaly.  No focal abnormality. Adrenals/Urinary Tract: Adrenal  glands are unremarkable. Kidneys are normal, without renal calculi, focal lesion, or hydronephrosis. Bladder is decompressed. Stomach/Bowel: Stomach is within normal limits. Appendix appears normal. No evidence of bowel wall thickening, distention, or inflammatory changes. Vascular/Lymphatic: No significant vascular findings are present. No enlarged abdominal or pelvic lymph nodes. Reproductive: Uterus and bilateral adnexa are unremarkable. Other: Trace free fluid in the pelvis is likely physiologic. No pneumoperitoneum. Musculoskeletal: No acute or significant osseous findings. Prior L5-S1 PLIF. IMPRESSION: 1. No acute intra-abdominal process. 2. New mild splenomegaly. 3. Trace right pleural effusion. Electronically Signed   By: Obie Dredge M.D.   On: 12/27/2023 16:28    Procedures Procedures    Medications Ordered in ED Medications  HYDROmorphone (DILAUDID) injection 0.5 mg (has no administration in time range)  promethazine (PHENERGAN) 25 mg in sodium chloride 0.9 % 50 mL IVPB (has no administration in time range)  sodium chloride 0.9 % bolus 1,000 mL (has no administration in time range)    ED Course/ Medical Decision Making/ A&P    Patient seen and examined. History obtained directly from patient.   Labs/EKG: Ordered CBC,  CMP, lipase, UA, preg.  Imaging: Ordered CT abd/pelvis.  Medications/Fluids: Ordered: dilaudid, phenergan, fluids.   Most recent vital signs reviewed and are as follows: BP 128/86 (BP Location: Right Arm)   Pulse 87   Temp 98.4 F (36.9 C) (Oral)   Resp 20   Ht 5\' 5"  (1.651 m)   Wt 120.2 kg   LMP 11/25/2023 (Approximate)   SpO2 97%   BMI 44.10 kg/m   Initial impression: generalized abd pain, not controlled, V/D.   2:03 PM Notified by RN that patient reportedly fell out of bed. No reported head injury. She is now back from CT, awaiting results.   2:14 PM Reassessment performed. Patient appears comfortable. She has a minimal abrasion to the left  pretibial area. She moves her knees normally, full active ROM. She states she flung her arm off to the side and fell, luckily landing on blankets. No head injury. She voiced no other injuries or concerns.   Labs personally reviewed and interpreted including: CBC with normal white blood cell count and red blood cell count; CMP unremarkable; lipase normal; urine without signs of infection; pregnancy negative.  Reviewed pertinent lab work and imaging with patient at bedside. Questions answered.   Most current vital signs reviewed and are as follows: BP 118/62   Pulse 77   Temp 98.4 F (36.9 C) (Oral)   Resp (!) 22   Ht 5\' 5"  (1.651 m)   Wt 120.2 kg   LMP 11/25/2023 (Approximate)   SpO2 99%   BMI 44.10 kg/m   Plan: Reassess after CT results.  4:46 PM Reassessment performed. Patient appears stable, comfortable currently.  Imaging personally visualized and interpreted including: Agree no acute findings, mild splenomegaly and minimal right pleural effusion noted.  Reviewed pertinent lab work and imaging with patient at bedside. Questions answered.   Most current vital signs reviewed and are as follows: BP 118/62   Pulse 77   Temp 98.4 F (36.9 C) (Oral)   Resp (!) 22   Ht 5\' 5"  (1.651 m)   Wt 120.2 kg   LMP 11/25/2023 (Approximate)   SpO2 99%   BMI 44.10 kg/m   Plan: Discharge to home.   Prescriptions written for: Oxycodone # 6 tablets of 5 mg  Other home care instructions discussed: Rest, hydration, avoidance of triggers  ED return instructions discussed: The patient was urged to return to the Emergency Department immediately with worsening of current symptoms, worsening abdominal pain, persistent vomiting, blood noted in stools, fever, or any other concerns. The patient verbalized understanding.   Follow-up instructions discussed: Patient encouraged to follow-up with their PCP in 3 days.                                 Medical Decision Making Amount and/or Complexity  of Data Reviewed Labs: ordered. Radiology: ordered.  Risk Prescription drug management.   For this patient's complaint of abdominal pain, the following conditions were considered on the differential diagnosis: gastritis/PUD, enteritis/duodenitis, appendicitis, cholelithiasis/cholecystitis, cholangitis, pancreatitis, ruptured viscus, colitis, diverticulitis, small/large bowel obstruction, proctitis, cystitis, pyelonephritis, ureteral colic, aortic dissection, aortic aneurysm. In women, ectopic pregnancy, pelvic inflammatory disease, ovarian cysts, and tubo-ovarian abscess were also considered. Atypical chest etiologies were also considered including ACS, PE, and pneumonia.  Workup today reassuring for any acute abnormalities which require surgical consultation or admission.  Patient appears more comfortable after treatment.  The patient's vital signs, pertinent lab work and imaging  were reviewed and interpreted as discussed in the ED course. Hospitalization was considered for further testing, treatments, or serial exams/observation. However as patient is well-appearing, has a stable exam, and reassuring studies today, I do not feel that they warrant admission at this time. This plan was discussed with the patient who verbalizes agreement and comfort with this plan and seems reliable and able to return to the Emergency Department with worsening or changing symptoms.          Final Clinical Impression(s) / ED Diagnoses Final diagnoses:  Generalized abdominal pain    Rx / DC Orders ED Discharge Orders          Ordered    oxyCODONE (OXY IR/ROXICODONE) 5 MG immediate release tablet  Every 6 hours PRN        12/27/23 1644              Renne Crigler, PA-C 12/27/23 1647    Pricilla Loveless, MD 01/02/24 [phone]

## 2024-01-14 ENCOUNTER — Ambulatory Visit: Payer: Medicaid Other | Admitting: Behavioral Health

## 2024-01-31 ENCOUNTER — Telehealth: Payer: Self-pay | Admitting: Psychiatry

## 2024-01-31 ENCOUNTER — Other Ambulatory Visit: Payer: Self-pay

## 2024-01-31 ENCOUNTER — Ambulatory Visit: Admitting: Behavioral Health

## 2024-01-31 DIAGNOSIS — F39 Unspecified mood [affective] disorder: Secondary | ICD-10-CM

## 2024-01-31 DIAGNOSIS — G4700 Insomnia, unspecified: Secondary | ICD-10-CM

## 2024-01-31 DIAGNOSIS — F419 Anxiety disorder, unspecified: Secondary | ICD-10-CM

## 2024-01-31 MED ORDER — TRAZODONE HCL 100 MG PO TABS: ORAL_TABLET | ORAL | 0 refills | Status: DC

## 2024-01-31 MED ORDER — LAMOTRIGINE 200 MG PO TABS: 200.0000 mg | ORAL_TABLET | Freq: Every day | ORAL | 0 refills | Status: DC

## 2024-01-31 NOTE — Telephone Encounter (Signed)
 Pt has an appt 02/11/24 with brian. She needs her trazodone 100 mg. Lamotrigine 200 mg and her xanax 0.5 mg refilled. She uses the [facility] on [address]

## 2024-01-31 NOTE — Telephone Encounter (Signed)
 RF sent for trazodone and Lamictal. Pended RF for alprazolam.

## 2024-02-03 MED ORDER — ALPRAZOLAM 0.5 MG PO TABS: 0.5000 mg | ORAL_TABLET | Freq: Every day | ORAL | 0 refills | Status: DC | PRN

## 2024-02-12 ENCOUNTER — Encounter: Payer: Self-pay | Admitting: Behavioral Health

## 2024-02-12 ENCOUNTER — Ambulatory Visit (INDEPENDENT_AMBULATORY_CARE_PROVIDER_SITE_OTHER): Admitting: Behavioral Health

## 2024-02-12 DIAGNOSIS — F39 Unspecified mood [affective] disorder: Secondary | ICD-10-CM | POA: Diagnosis not present

## 2024-02-12 DIAGNOSIS — F419 Anxiety disorder, unspecified: Secondary | ICD-10-CM | POA: Diagnosis not present

## 2024-02-12 DIAGNOSIS — F900 Attention-deficit hyperactivity disorder, predominantly inattentive type: Secondary | ICD-10-CM | POA: Diagnosis not present

## 2024-02-12 DIAGNOSIS — G4700 Insomnia, unspecified: Secondary | ICD-10-CM | POA: Diagnosis not present

## 2024-02-12 MED ORDER — SERTRALINE HCL 100 MG PO TABS: 200.0000 mg | ORAL_TABLET | Freq: Every day | ORAL | 1 refills | Status: DC

## 2024-02-12 MED ORDER — TRAZODONE HCL 100 MG PO TABS: ORAL_TABLET | ORAL | 1 refills | Status: DC

## 2024-02-12 MED ORDER — ALPRAZOLAM 0.5 MG PO TABS: 0.5000 mg | ORAL_TABLET | Freq: Every day | ORAL | 2 refills | Status: DC | PRN

## 2024-02-12 MED ORDER — METHYLPHENIDATE HCL ER (OSM) 36 MG PO TBCR: 36.0000 mg | EXTENDED_RELEASE_TABLET | Freq: Every day | ORAL | 0 refills | Status: DC

## 2024-02-12 MED ORDER — LAMOTRIGINE 200 MG PO TABS: 200.0000 mg | ORAL_TABLET | Freq: Every day | ORAL | 1 refills | Status: DC

## 2024-02-12 NOTE — Progress Notes (Signed)
 [facility]  Patient ID: Dawn King,  MRN: [id number]  PCP: Harvest Forest, MD  Date of Evaluation: 02/12/2024 Time spent:30 minutes  Chief Complaint:  Chief Complaint   Depression; Anxiety; Follow-up; Medication Refill; Patient Education; ADHD     HISTORY/CURRENT STATUS: HPI Dawn King presents for follow-up of anxiety, depression, and ADHD.  Collateral information should be considered reliable she is a former patient of Corie Chiquito, NP.  Says that everything is going very well currently.  Says that she would like to consider switching from [place] back to Concerta because she does not feel the medication works as well.  Her moods have been stable.  Says that she is always wondered whether she was on the spectrum or not, but at this point she says that it adds no value to be tested.  She has removed herself from a reported "toxic" job, and says this is made a big difference with her mental wellbeing.  She is okay with continued 94-month follow-ups .  Denies mania, no psychosis, no auditory or visual hallucinations.  Denies SI or HI.   Past Psychiatric Medication Trials: Concerta- Helpful. Started in 5th grade. Took it sporadically about 5-6 years ago and not since. Does not recall side effects. Took 36 mg po qd. Adderall- Had loss of appetite. May have been XR. Sertraline- Has taken since 8th grade. Has been helpful for mood and anxiety. Paxil- Took prior to 8th grade. Lamictal-Started 5 years ago. Has been helpful for depression and mood lability. Hydroxyzine- allergic reaction Xanax- Intermittently effective Valium- prescribed for muscle spasms   Individual Medical History/ Review of Systems: Changes? :No   Allergies: Levaquin [levofloxacin], Cefaclor, Diphenhydramine, Hydroxyzine, Percocet [oxycodone-acetaminophen], Sulfonamide derivatives, Tramadol, Carafate [sucralfate], Other, Sunflower oil, Clindamycin/lincomycin, Codeine, Keflex  [cephalexin], Morphine and codeine, and Zofran [ondansetron]  Current Medications:  Current Outpatient Medications:    methylphenidate (CONCERTA) 36 MG PO CR tablet, Take 1 tablet (36 mg total) by mouth daily., Disp: 30 tablet, Rfl: 0   ALPRAZolam (XANAX) 0.5 MG tablet, Take 1 tablet (0.5 mg total) by mouth daily as needed for anxiety., Disp: 15 tablet, Rfl: 2   ferrous sulfate 325 (65 FE) MG tablet, Take 325 mg by mouth daily with breakfast. (Patient not taking: Reported on 08/09/2023), Disp: , Rfl:    HYDROcodone-acetaminophen (NORCO/VICODIN) 5-325 MG tablet, Take 1 tablet by mouth every 6 (six) hours as needed for severe pain (pain score 7-10)., Disp: 6 tablet, Rfl: 0   lamoTRIgine (LAMICTAL) 200 MG tablet, Take 1 tablet (200 mg total) by mouth at bedtime., Disp: 90 tablet, Rfl: 1   medroxyPROGESTERone Acetate 150 MG/ML SUSY, medroxyprogesterone 150 mg/mL intramuscular syringe  Inject 1 mL every 3 months by intramuscular route. (Patient not taking: Reported on 12/12/2022), Disp: , Rfl:    Methylphenidate HCl ER, PM, (JORNAY PM) 40 MG CP24, Take 1 capsule (40 mg total) by mouth every evening., Disp: 30 capsule, Rfl: 0   Methylphenidate HCl ER, PM, (JORNAY PM) 40 MG CP24, Take 1 capsule (40 mg total) by mouth every evening., Disp: 30 capsule, Rfl: 0   Methylphenidate HCl ER, PM, (JORNAY PM) 40 MG CP24, Take 1 capsule (40 mg total) by mouth every evening., Disp: 30 capsule, Rfl: 0   PROBIOTIC, LACTOBACILLUS, PO, Take by mouth., Disp: , Rfl:    promethazine (PHENERGAN) 25 MG tablet, Take 25 mg by mouth every 6 (six) hours as needed for nausea or vomiting., Disp: , Rfl:    sertraline (ZOLOFT) 100 MG  tablet, Take 2 tablets (200 mg total) by mouth at bedtime., Disp: 180 tablet, Rfl: 1   SUMAtriptan (IMITREX) 50 MG tablet, sumatriptan 50 mg tablet, Disp: , Rfl:    traZODone (DESYREL) 100 MG tablet, Take 1-1.5 tablets po QHS prn insomnia, Disp: 145 tablet, Rfl: 1 Medication Side Effects: none  Family  Medical/ Social History: Changes? No  MENTAL HEALTH EXAM:  There were no vitals taken for this visit.There is no height or weight on file to calculate BMI.  General Appearance: Casual, Neat, and Well Groomed  Eye Contact:  Good  Speech:  Clear and Coherent and Talkative  Volume:  Normal  Mood:  NA  Affect:  Appropriate  Thought Process:  Coherent  Orientation:  Full (Time, Place, and Person)  Thought Content: Logical   Suicidal Thoughts:  No  Homicidal Thoughts:  No  Memory:  WNL  Judgement:  Good  Insight:  Good  Psychomotor Activity:  Normal  Concentration:  Concentration: Good  Recall:  Good  Fund of Knowledge: Good  Language: Good  Assets:  Desire for Improvement  ADL's:  Intact  Cognition: WNL  Prognosis:  Good    DIAGNOSES:    ICD-10-CM   1. Attention deficit hyperactivity disorder (ADHD), predominantly inattentive type  F90.0 methylphenidate (CONCERTA) 36 MG PO CR tablet    2. Anxiety disorder, unspecified type  F41.9 ALPRAZolam (XANAX) 0.5 MG tablet    sertraline (ZOLOFT) 100 MG tablet    3. Episodic mood disorder (HCC)  F39 lamoTRIgine (LAMICTAL) 200 MG tablet    sertraline (ZOLOFT) 100 MG tablet    4. Insomnia, unspecified type  G47.00 traZODone (DESYREL) 100 MG tablet      Receiving Psychotherapy: No    RECOMMENDATIONS:   30 minutes spent dedicated to the care of this patient on the date of this encounter to include pre-visit review of records, ordering of medication, post visit documentation, and face-to-face time with the patient.  We discussed that Ophelia Charter worked very well for her but that she could not get insurance to cooperate.  She would like to restart Concerta.  Continue Lamictal 200 mg daily for mood disorder.  Continue Sertraline 200 mg daily for depression and anxiety.  Continue Trazodone 100-150 mg at bedtime prn.  Continue Alprazolam prn every day prn anxiety. Pt to follow-up in 6 months or sooner if clinically indicated.  Provided  emergency contact information Patient advised to contact office with any questions, adverse effects, or acute worsening in signs and symptoms. Reviewed PDMP   Joan Flores, NP

## 2024-07-28 ENCOUNTER — Ambulatory Visit (INDEPENDENT_AMBULATORY_CARE_PROVIDER_SITE_OTHER): Payer: MEDICAID | Admitting: Behavioral Health

## 2024-07-28 ENCOUNTER — Encounter: Payer: Self-pay | Admitting: Behavioral Health

## 2024-07-28 DIAGNOSIS — G4700 Insomnia, unspecified: Secondary | ICD-10-CM | POA: Diagnosis not present

## 2024-07-28 DIAGNOSIS — F900 Attention-deficit hyperactivity disorder, predominantly inattentive type: Secondary | ICD-10-CM | POA: Diagnosis not present

## 2024-07-28 DIAGNOSIS — F39 Unspecified mood [affective] disorder: Secondary | ICD-10-CM

## 2024-07-28 DIAGNOSIS — F419 Anxiety disorder, unspecified: Secondary | ICD-10-CM

## 2024-07-28 MED ORDER — TRAZODONE HCL 100 MG PO TABS: ORAL_TABLET | ORAL | 1 refills | Status: DC

## 2024-07-28 MED ORDER — DESVENLAFAXINE SUCCINATE ER 50 MG PO TB24: 50.0000 mg | ORAL_TABLET | Freq: Every day | ORAL | 1 refills | Status: DC

## 2024-07-28 MED ORDER — LAMOTRIGINE 200 MG PO TABS: 200.0000 mg | ORAL_TABLET | Freq: Every day | ORAL | 1 refills | Status: DC

## 2024-07-28 MED ORDER — ALPRAZOLAM 0.5 MG PO TABS: 0.5000 mg | ORAL_TABLET | Freq: Every day | ORAL | 2 refills | Status: AC | PRN

## 2024-07-28 NOTE — Progress Notes (Signed)
 [facility]  Patient ID: Dawn King,  MRN: [id number]  PCP: Roanna Ezekiel NOVAK, MD  Date of Evaluation: 07/28/2024 Time spent:30 minutes  Chief Complaint:  Chief Complaint   Anxiety; Depression; Follow-up; Medication Refill; Patient Education; Medication Problem     HISTORY/CURRENT STATUS: HPI Dawn King presents for follow-up of anxiety, depression, and ADHD.  Collateral information should be considered reliable.  Patient appears distressed and tearful today.  Says that she had some confusion with obtaining her medication and just slowly stopped taking her antidepressant.  Says that at first she was doing pretty good and then she started experiencing increase of emotional lability and severe depression.  He understands that she should have not stopped taking the medication and says that she learned a lesson from this.  She has been taking Zoloft  for many years but would like to try a different antidepressant this visit.  She has continued to take her Lamictal  as prescribed.  Denies mania, no psychosis, no auditory or visual hallucinations.  Denies SI or HI.   Past Psychiatric Medication Trials: Concerta - Helpful. Started in 5th grade. Took it sporadically about 5-6 years ago and not since. Does not recall side effects. Took 36 mg po qd. Adderall- Had loss of appetite. May have been XR. Sertraline - Has taken since 8th grade. Has been helpful for mood and anxiety. Paxil- Took prior to 8th grade. Lamictal -Started 5 years ago. Has been helpful for depression and mood lability. Hydroxyzine- allergic reaction Xanax - Intermittently effective Valium - prescribed for muscle spasms   Individual Medical History/ Review of Systems: Changes? :No    Allergies: Levaquin [levofloxacin], Cefaclor, Diphenhydramine , Hydroxyzine, Percocet [oxycodone -acetaminophen ], Sulfonamide derivatives, Tramadol , Carafate [sucralfate], Other, Sunflower oil, Clindamycin/lincomycin,  Codeine, Keflex [cephalexin], Morphine  and codeine, and Zofran  [ondansetron ]         Individual Medical History/ Review of Systems: Changes? :No   Allergies: Levaquin [levofloxacin], Cefaclor, Diphenhydramine , Hydroxyzine, Percocet [oxycodone -acetaminophen ], Sulfonamide derivatives, Tramadol , Carafate [sucralfate], Other, Sunflower oil, Clindamycin/lincomycin, Codeine, Keflex [cephalexin], Morphine  and codeine, and Zofran  [ondansetron ]  Current Medications:  Current Outpatient Medications:    desvenlafaxine  (PRISTIQ ) 50 MG 24 hr tablet, Take 1 tablet (50 mg total) by mouth daily., Disp: 30 tablet, Rfl: 1   ALPRAZolam  (XANAX ) 0.5 MG tablet, Take 1 tablet (0.5 mg total) by mouth daily as needed for anxiety., Disp: 15 tablet, Rfl: 2   ferrous sulfate 325 (65 FE) MG tablet, Take 325 mg by mouth daily with breakfast. (Patient not taking: Reported on 08/09/2023), Disp: , Rfl:    HYDROcodone -acetaminophen  (NORCO/VICODIN) 5-325 MG tablet, Take 1 tablet by mouth every 6 (six) hours as needed for severe pain (pain score 7-10)., Disp: 6 tablet, Rfl: 0   lamoTRIgine  (LAMICTAL ) 200 MG tablet, Take 1 tablet (200 mg total) by mouth at bedtime., Disp: 90 tablet, Rfl: 1   medroxyPROGESTERone Acetate 150 MG/ML SUSY, medroxyprogesterone 150 mg/mL intramuscular syringe  Inject 1 mL every 3 months by intramuscular route. (Patient not taking: Reported on 12/12/2022), Disp: , Rfl:    methylphenidate  (CONCERTA ) 36 MG PO CR tablet, Take 1 tablet (36 mg total) by mouth daily., Disp: 30 tablet, Rfl: 0   Methylphenidate  HCl ER, PM, (JORNAY PM ) 40 MG CP24, Take 1 capsule (40 mg total) by mouth every evening., Disp: 30 capsule, Rfl: 0   Methylphenidate  HCl ER, PM, (JORNAY PM ) 40 MG CP24, Take 1 capsule (40 mg total) by mouth every evening., Disp: 30 capsule, Rfl: 0   Methylphenidate  HCl ER, PM, (JORNAY PM ) 40 MG CP24, Take  1 capsule (40 mg total) by mouth every evening., Disp: 30 capsule, Rfl: 0   PROBIOTIC, LACTOBACILLUS,  PO, Take by mouth., Disp: , Rfl:    promethazine  (PHENERGAN ) 25 MG tablet, Take 25 mg by mouth every 6 (six) hours as needed for nausea or vomiting., Disp: , Rfl:    SUMAtriptan (IMITREX) 50 MG tablet, sumatriptan 50 mg tablet, Disp: , Rfl:    traZODone  (DESYREL ) 100 MG tablet, Take 1-1.5 tablets po QHS prn insomnia, Disp: 145 tablet, Rfl: 1 Medication Side Effects: none  Family Medical/ Social History: Changes? No  MENTAL HEALTH EXAM:  There were no vitals taken for this visit.There is no height or weight on file to calculate BMI.  General Appearance: Casual, Neat, and Well Groomed  Eye Contact:  Good  Speech:  Clear and Coherent  Volume:  Normal  Mood:  Anxious, Depressed, and Dysphoric  Affect:  Congruent, Depressed, Labile, and Anxious  Thought Process:  Coherent  Orientation:  Full (Time, Place, and Person)  Thought Content: Logical   Suicidal Thoughts:  No  Homicidal Thoughts:  No  Memory:  WNL  Judgement:  Good  Insight:  Good  Psychomotor Activity:  Normal  Concentration:  Concentration: Good  Recall:  Good  Fund of Knowledge: Good  Language: Good  Assets:  Desire for Improvement  ADL's:  Intact  Cognition: WNL  Prognosis:  Good    DIAGNOSES:    ICD-10-CM   1. Anxiety disorder, unspecified type  F41.9 desvenlafaxine  (PRISTIQ ) 50 MG 24 hr tablet    ALPRAZolam  (XANAX ) 0.5 MG tablet    2. Episodic mood disorder (HCC)  F39 desvenlafaxine  (PRISTIQ ) 50 MG 24 hr tablet    lamoTRIgine  (LAMICTAL ) 200 MG tablet    3. Insomnia, unspecified type  G47.00 desvenlafaxine  (PRISTIQ ) 50 MG 24 hr tablet    traZODone  (DESYREL ) 100 MG tablet    4. Attention deficit hyperactivity disorder (ADHD), predominantly inattentive type  F90.0       Receiving Psychotherapy: No    RECOMMENDATIONS:   30 minutes spent dedicated to the care of this patient on the date of this encounter to include pre-visit review of records, ordering of medication, post visit documentation, and  face-to-face time with the patient.  We discussed her recent decline with depression and emotional lability due to stopping her sertraline .  We discussed new medication options as well as possible side effect profiles.  She was agreeable to a trial of Pristiq  this visit.  Will follow-up in 4 weeks to reassess her progress.  Will start Pristiq  50 mg daily after breakfast   Continue Lamictal  200 mg daily for mood disorder.  Continue Trazodone  100-150 mg at bedtime prn.  Continue Alprazolam  prn every day prn anxiety. Pt to follow-up in 4 months or sooner if clinically indicated.  Provided emergency contact information Patient advised to contact office with any questions, adverse effects, or acute worsening in signs and symptoms. Reviewed PDMP          Redell DELENA Pizza, NP

## 2024-08-13 ENCOUNTER — Ambulatory Visit: Admitting: Behavioral Health

## 2024-08-17 ENCOUNTER — Telehealth: Payer: Self-pay | Admitting: Behavioral Health

## 2024-08-17 NOTE — Telephone Encounter (Signed)
 Pt LVM @ 11:32a stating the medication Redell started her own 3 wks ago worked really well for 2 wks but now it's not working.  Her anxiety is back. Pls call her back to discuss.  Next appt 10/14

## 2024-08-18 NOTE — Telephone Encounter (Signed)
 Pt seen last month: Will start Pristiq  50 mg daily after breakfast   Continue Lamictal  200 mg daily for mood disorder.  Continue Trazodone  100-150 mg at bedtime prn.  Continue Alprazolam  prn every day prn anxiety. Pt to follow-up in 4 months or sooner if clinically indicated.   She reports the Pristiq  seemed to work well for 2 weeks, but is no longer working. Rates at at 7/10, is crying. She is missing a lot of work. 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 She said she feels like her HR has increased, but hasn't checked it. Is sleeping with trazodone . Is able to do household chores. No new stressors.   Not in therapy currently but has an appt with [facility] 11/20.

## 2024-08-19 NOTE — Telephone Encounter (Signed)
 Please pend Pristiq  100 mg daily after breakfast

## 2024-08-20 MED ORDER — DESVENLAFAXINE SUCCINATE ER 100 MG PO TB24: 100.0000 mg | ORAL_TABLET | Freq: Every day | ORAL | 1 refills | Status: DC

## 2024-08-20 NOTE — Telephone Encounter (Signed)
 Sent!

## 2024-08-25 ENCOUNTER — Encounter: Payer: Self-pay | Admitting: Behavioral Health

## 2024-08-25 ENCOUNTER — Ambulatory Visit (INDEPENDENT_AMBULATORY_CARE_PROVIDER_SITE_OTHER): Payer: MEDICAID | Admitting: Behavioral Health

## 2024-08-25 DIAGNOSIS — F39 Unspecified mood [affective] disorder: Secondary | ICD-10-CM

## 2024-08-25 DIAGNOSIS — F411 Generalized anxiety disorder: Secondary | ICD-10-CM | POA: Diagnosis not present

## 2024-08-25 DIAGNOSIS — F900 Attention-deficit hyperactivity disorder, predominantly inattentive type: Secondary | ICD-10-CM

## 2024-08-25 DIAGNOSIS — G4700 Insomnia, unspecified: Secondary | ICD-10-CM | POA: Diagnosis not present

## 2024-08-25 MED ORDER — DESVENLAFAXINE SUCCINATE ER 100 MG PO TB24: 100.0000 mg | ORAL_TABLET | Freq: Every day | ORAL | 3 refills | Status: DC

## 2024-08-25 NOTE — Progress Notes (Signed)
 [facility]  Patient ID: Dawn King,  MRN: [id number]  PCP: Roanna Ezekiel NOVAK, MD  Date of Evaluation: 08/25/2024 Time spent:30 minutes  Chief Complaint:  Chief Complaint   Anxiety; Depression; Follow-up; Medication Refill; Patient Education; Medication Problem     HISTORY/CURRENT STATUS: HPI Dawn King presents for follow-up of anxiety, depression, and ADHD.  Collateral information should be considered reliable.  Patient is not tearful today.  Says that she has been feeling much better.  She had 1 day last week where she forgot to take her medication and woke up the next morning feeling poorly.  However, since we increased the dose of her Pristiq  she has returned to work and making significant progress.   She has continued to take her Lamictal  as prescribed.   Reporting anxiety today at 3/10, and depression at 3/10.  Feels like her depression is manageable.  She is practicing healthy coping mechanisms such as trying to be more social and working more. Denies mania, no psychosis, no auditory or visual hallucinations.  Denies SI or HI.   Past Psychiatric Medication Trials: Concerta - Helpful. Started in 5th grade. Took it sporadically about 5-6 years ago and not since. Does not recall side effects. Took 36 mg po qd. Adderall- Had loss of appetite. May have been XR. Sertraline - Has taken since 8th grade. Has been helpful for mood and anxiety. Paxil- Took prior to 8th grade. Lamictal -Started 5 years ago. Has been helpful for depression and mood lability. Hydroxyzine- allergic reaction Xanax - Intermittently effective Valium - prescribed for muscle spasms       Individual Medical History/ Review of Systems: Changes? :No   Allergies: Levaquin [levofloxacin], Cefaclor, Diphenhydramine , Hydroxyzine, Percocet [oxycodone -acetaminophen ], Sulfonamide derivatives, Tramadol , Carafate [sucralfate], Other, Sunflower oil, Clindamycin/lincomycin, Codeine,  Keflex [cephalexin], Morphine  and codeine, and Zofran  [ondansetron ]  Current Medications:  Current Outpatient Medications:    ALPRAZolam  (XANAX ) 0.5 MG tablet, Take 1 tablet (0.5 mg total) by mouth daily as needed for anxiety., Disp: 15 tablet, Rfl: 2   desvenlafaxine  (PRISTIQ ) 100 MG 24 hr tablet, Take 1 tablet (100 mg total) by mouth daily., Disp: 30 tablet, Rfl: 3   ferrous sulfate 325 (65 FE) MG tablet, Take 325 mg by mouth daily with breakfast. (Patient not taking: Reported on 08/09/2023), Disp: , Rfl:    HYDROcodone -acetaminophen  (NORCO/VICODIN) 5-325 MG tablet, Take 1 tablet by mouth every 6 (six) hours as needed for severe pain (pain score 7-10)., Disp: 6 tablet, Rfl: 0   lamoTRIgine  (LAMICTAL ) 200 MG tablet, Take 1 tablet (200 mg total) by mouth at bedtime., Disp: 90 tablet, Rfl: 1   medroxyPROGESTERone Acetate 150 MG/ML SUSY, medroxyprogesterone 150 mg/mL intramuscular syringe  Inject 1 mL every 3 months by intramuscular route. (Patient not taking: Reported on 12/12/2022), Disp: , Rfl:    methylphenidate  (CONCERTA ) 36 MG PO CR tablet, Take 1 tablet (36 mg total) by mouth daily., Disp: 30 tablet, Rfl: 0   Methylphenidate  HCl ER, PM, (JORNAY PM ) 40 MG CP24, Take 1 capsule (40 mg total) by mouth every evening., Disp: 30 capsule, Rfl: 0   Methylphenidate  HCl ER, PM, (JORNAY PM ) 40 MG CP24, Take 1 capsule (40 mg total) by mouth every evening., Disp: 30 capsule, Rfl: 0   Methylphenidate  HCl ER, PM, (JORNAY PM ) 40 MG CP24, Take 1 capsule (40 mg total) by mouth every evening., Disp: 30 capsule, Rfl: 0   PROBIOTIC, LACTOBACILLUS, PO, Take by mouth., Disp: , Rfl:    promethazine  (PHENERGAN ) 25 MG tablet, Take 25 mg by  mouth every 6 (six) hours as needed for nausea or vomiting., Disp: , Rfl:    SUMAtriptan (IMITREX) 50 MG tablet, sumatriptan 50 mg tablet, Disp: , Rfl:    traZODone  (DESYREL ) 100 MG tablet, Take 1-1.5 tablets po QHS prn insomnia, Disp: 145 tablet, Rfl: 1 Medication Side Effects:  none  Family Medical/ Social History: Changes? No  MENTAL HEALTH EXAM:  There were no vitals taken for this visit.There is no height or weight on file to calculate BMI.  General Appearance: Casual, Neat, and Well Groomed  Eye Contact:  Good  Speech:  Clear and Coherent  Volume:  Normal  Mood:  Anxious and Depressed  Affect:  Appropriate  Thought Process:  Coherent  Orientation:  Full (Time, Place, and Person)  Thought Content: Logical   Suicidal Thoughts:  No  Homicidal Thoughts:  No  Memory:  WNL  Judgement:  Good  Insight:  Good  Psychomotor Activity:  Normal  Concentration:  Concentration: Good  Recall:  Good  Fund of Knowledge: Good  Language: Good  Assets:  Desire for Improvement  ADL's:  Intact  Cognition: WNL  Prognosis:  Good    DIAGNOSES:    ICD-10-CM   1. Generalized anxiety disorder  F41.1 desvenlafaxine  (PRISTIQ ) 100 MG 24 hr tablet    2. Attention deficit hyperactivity disorder (ADHD), predominantly inattentive type  F90.0     3. Insomnia, unspecified type  G47.00     4. Episodic mood disorder  F39 desvenlafaxine  (PRISTIQ ) 100 MG 24 hr tablet      Receiving Psychotherapy: No    RECOMMENDATIONS: 20 minutes spent dedicated to the care of this patient on the date of this encounter to include pre-visit review of records, ordering of medication, post visit.  We discussed her significant improvement since increasing the dose of her Pristiq .  She has recently went back to work and is socializing with family more. We agreed to: Continue Pristiq  100 mg daily in the a.m. after breakfast   Continue Lamictal  200 mg daily for mood disorder.  Continue Trazodone  100-150 mg at bedtime prn.  Continue Alprazolam  prn every day prn anxiety. Pt to follow-up in 4 months or sooner if clinically indicated.  Provided emergency contact information Patient advised to contact office with any questions, adverse effects, or acute worsening in signs and symptoms. Reviewed  PDMP   Redell DELENA Pizza, NP

## 2024-10-06 ENCOUNTER — Encounter: Payer: Self-pay | Admitting: Professional Counselor

## 2024-10-06 ENCOUNTER — Ambulatory Visit (INDEPENDENT_AMBULATORY_CARE_PROVIDER_SITE_OTHER): Payer: MEDICAID | Admitting: Professional Counselor

## 2024-10-06 DIAGNOSIS — F411 Generalized anxiety disorder: Secondary | ICD-10-CM

## 2024-10-06 DIAGNOSIS — F39 Unspecified mood [affective] disorder: Secondary | ICD-10-CM
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
## 2024-10-20 ENCOUNTER — Encounter: Payer: Self-pay | Admitting: Behavioral Health

## 2024-10-20 ENCOUNTER — Ambulatory Visit: Payer: MEDICAID | Admitting: Behavioral Health

## 2024-10-20 DIAGNOSIS — G4700 Insomnia, unspecified: Secondary | ICD-10-CM

## 2024-10-20 DIAGNOSIS — F39 Unspecified mood [affective] disorder: Secondary | ICD-10-CM

## 2024-10-20 DIAGNOSIS — F900 Attention-deficit hyperactivity disorder, predominantly inattentive type: Secondary | ICD-10-CM | POA: Diagnosis not present

## 2024-10-20 DIAGNOSIS — F411 Generalized anxiety disorder: Secondary | ICD-10-CM | POA: Diagnosis not present

## 2024-10-20 MED ORDER — LAMOTRIGINE 200 MG PO TABS: 200.0000 mg | ORAL_TABLET | Freq: Every day | ORAL | 1 refills | Status: AC

## 2024-10-20 MED ORDER — DESVENLAFAXINE SUCCINATE ER 100 MG PO TB24: 100.0000 mg | ORAL_TABLET | Freq: Every day | ORAL | 1 refills | Status: AC

## 2024-10-20 MED ORDER — TRAZODONE HCL 100 MG PO TABS: ORAL_TABLET | ORAL | 1 refills | Status: AC

## 2024-10-20 MED ORDER — METHYLPHENIDATE HCL ER (OSM) 36 MG PO TBCR: 36.0000 mg | EXTENDED_RELEASE_TABLET | Freq: Every day | ORAL | 0 refills | Status: AC

## 2024-10-20 NOTE — Progress Notes (Signed)
 [facility]  Patient ID: Dawn King,  MRN: [id number]  PCP: Roanna Ezekiel NOVAK, MD  Date of Evaluation: 10/20/2024 Time spent:20 minutes  Chief Complaint:  Chief Complaint   Depression; ADHD; Anxiety; Follow-up; Medication Refill; Patient Education     HISTORY/CURRENT STATUS: HPI Dawn King presents for follow-up of anxiety, depression, and ADHD.  Collateral information should be considered reliable.  Patient is not tearful today.  Says that she has been feeling much better.  For now she is very happy with her current medication regimen and requesting no changes today.  Will follow-up in 3 months.  Reporting anxiety today at 3/10, and depression at 3/10.  Feels like her depression is manageable.  She is practicing healthy coping mechanisms such as trying to be more social and working more. Denies mania, no psychosis, no auditory or visual hallucinations.  Denies SI or HI.   Past Psychiatric Medication Trials: Concerta - Helpful. Started in 5th grade. Took it sporadically about 5-6 years ago and not since. Does not recall side effects. Took 36 mg po qd. Adderall- Had loss of appetite. May have been XR. Sertraline - Has taken since 8th grade. Has been helpful for mood and anxiety. Paxil- Took prior to 8th grade. Lamictal -Started 5 years ago. Has been helpful for depression and mood lability. Hydroxyzine- allergic reaction Xanax - Intermittently effective Valium - prescribed for muscle spasms     Individual Medical History/ Review of Systems: Changes? :No   Allergies: Levaquin [levofloxacin], Cefaclor, Diphenhydramine , Hydroxyzine, Percocet [oxycodone -acetaminophen ], Sulfonamide derivatives, Tramadol , Carafate [sucralfate], Other, Sunflower oil, Clindamycin/lincomycin, Codeine, Keflex [cephalexin], Morphine  and codeine, and Zofran  [ondansetron ]  Current Medications:  Current Outpatient Medications:    ALPRAZolam  (XANAX ) 0.5 MG tablet, Take 1 tablet  (0.5 mg total) by mouth daily as needed for anxiety., Disp: 15 tablet, Rfl: 2   desvenlafaxine  (PRISTIQ ) 100 MG 24 hr tablet, Take 1 tablet (100 mg total) by mouth daily., Disp: 90 tablet, Rfl: 1   ferrous sulfate 325 (65 FE) MG tablet, Take 325 mg by mouth daily with breakfast. (Patient not taking: Reported on 08/09/2023), Disp: , Rfl:    HYDROcodone -acetaminophen  (NORCO/VICODIN) 5-325 MG tablet, Take 1 tablet by mouth every 6 (six) hours as needed for severe pain (pain score 7-10)., Disp: 6 tablet, Rfl: 0   lamoTRIgine  (LAMICTAL ) 200 MG tablet, Take 1 tablet (200 mg total) by mouth at bedtime., Disp: 90 tablet, Rfl: 1   medroxyPROGESTERone Acetate 150 MG/ML SUSY, medroxyprogesterone 150 mg/mL intramuscular syringe  Inject 1 mL every 3 months by intramuscular route. (Patient not taking: Reported on 12/12/2022), Disp: , Rfl:    methylphenidate  (CONCERTA ) 36 MG PO CR tablet, Take 1 tablet (36 mg total) by mouth daily., Disp: 30 tablet, Rfl: 0   Methylphenidate  HCl ER, PM, (JORNAY PM ) 40 MG CP24, Take 1 capsule (40 mg total) by mouth every evening., Disp: 30 capsule, Rfl: 0   Methylphenidate  HCl ER, PM, (JORNAY PM ) 40 MG CP24, Take 1 capsule (40 mg total) by mouth every evening., Disp: 30 capsule, Rfl: 0   Methylphenidate  HCl ER, PM, (JORNAY PM ) 40 MG CP24, Take 1 capsule (40 mg total) by mouth every evening., Disp: 30 capsule, Rfl: 0   PROBIOTIC, LACTOBACILLUS, PO, Take by mouth., Disp: , Rfl:    promethazine  (PHENERGAN ) 25 MG tablet, Take 25 mg by mouth every 6 (six) hours as needed for nausea or vomiting., Disp: , Rfl:    SUMAtriptan (IMITREX) 50 MG tablet, sumatriptan 50 mg tablet, Disp: , Rfl:    traZODone  (  DESYREL ) 100 MG tablet, Take 1-1.5 tablets po QHS prn insomnia, Disp: 145 tablet, Rfl: 1 Medication Side Effects: none  Family Medical/ Social History: Changes? No  MENTAL HEALTH EXAM:  There were no vitals taken for this visit.There is no height or weight on file to calculate BMI.  General  Appearance: Casual, Neat, and Well Groomed  Eye Contact:  Good  Speech:  Clear and Coherent  Volume:  Normal  Mood:  NA  Affect:  Appropriate  Thought Process:  Coherent  Orientation:  Full (Time, Place, and Person)  Thought Content: Logical   Suicidal Thoughts:  No  Homicidal Thoughts:  No  Memory:  WNL  Judgement:  Good  Insight:  Good  Psychomotor Activity:  Normal  Concentration:  Concentration: Good  Recall:  Good  Fund of Knowledge: Good  Language: Good  Assets:  Desire for Improvement  ADL's:  Intact  Cognition: WNL  Prognosis:  Good    DIAGNOSES:    ICD-10-CM   1. Episodic mood disorder  F39 lamoTRIgine  (LAMICTAL ) 200 MG tablet    desvenlafaxine  (PRISTIQ ) 100 MG 24 hr tablet    2. Generalized anxiety disorder  F41.1 desvenlafaxine  (PRISTIQ ) 100 MG 24 hr tablet    3. Insomnia, unspecified type  G47.00 traZODone  (DESYREL ) 100 MG tablet    4. Attention deficit hyperactivity disorder (ADHD), predominantly inattentive type  F90.0 methylphenidate  (CONCERTA ) 36 MG PO CR tablet      Receiving Psychotherapy: No    RECOMMENDATIONS:  20 minutes spent dedicated to the care of this patient on the date of this encounter to include pre-visit review of records, ordering of medication, post visit.  We discussed her significant improvement since increasing the dose of her Pristiq .  Right now she is accomplishing a lot of work and feeling good about herself.   We agreed to: Continue Pristiq  100 mg daily in the a.m. after breakfast   Continue Lamictal  200 mg daily for mood disorder.  Continue Trazodone  100-150 mg at bedtime prn.  Continue Alprazolam  prn every day prn anxiety. Pt to follow-up in 3 months or sooner if clinically indicated.  Provided emergency contact information Patient advised to contact office with any questions, adverse effects, or acute worsening in signs and symptoms. Reviewed PDMP        Redell DELENA Pizza, NP

## 2024-10-28 ENCOUNTER — Ambulatory Visit: Payer: MEDICAID | Admitting: Professional Counselor

## 2024-10-28 NOTE — Progress Notes (Signed)
 Patient did not show for scheduled appointment. No message.  Almarie Sprang, [facility]

## 2024-11-16 ENCOUNTER — Ambulatory Visit: Payer: MEDICAID | Admitting: Professional Counselor

## 2024-11-16 NOTE — Progress Notes (Signed)
 Patient did not show for scheduled appointment. No message.  Almarie Sprang, [facility]

## 2024-12-15 ENCOUNTER — Ambulatory Visit: Payer: MEDICAID | Admitting: Professional Counselor

## 2024-12-15 ENCOUNTER — Encounter: Payer: Self-pay | Admitting: Professional Counselor

## 2024-12-15 DIAGNOSIS — F4312 Post-traumatic stress disorder, chronic: Secondary | ICD-10-CM

## 2024-12-15 DIAGNOSIS — F411 Generalized anxiety disorder: Secondary | ICD-10-CM

## 2024-12-15 DIAGNOSIS — F4320 Adjustment disorder, unspecified: Secondary | ICD-10-CM

## 2024-12-15 DIAGNOSIS — F39 Unspecified mood [affective] disorder: Secondary | ICD-10-CM

## 2024-12-15 NOTE — Progress Notes (Unsigned)
 "       [facility] Counselor/Therapist Progress Note  Patient ID: Dawn King, MRN: [id number],    Date: 12/15/2024  Time Spent: 10:11 AM to 11:09 AM  Treatment Type: Individual Therapy  I connected with this patient by an approved telecommunication method (video), with her informed consent, and verifying identity and patient privacy.  I was located at my home and patient at her home.  As needed, we discussed the limitations, risks, and security and privacy concerns associated with telehealth service, including the availability and conditions which currently govern in-person appointments and the possibility that 3rd-party payment may not be fully guaranteed and she may be responsible for charges.  After she indicated understanding, we proceeded with the session.  Also discussed treatment planning, as needed, including ongoing verbal agreement with the plan, the opportunity to ask and answer all questions, her demonstrated understanding of instructions, and her readiness to call the office should symptoms worsen or she feels she is in a crisis state and needs more immediate and tangible assistance.   Reported Symptoms: Grief/loss, sadness, tearfulness, low mood, anhedonia, anxiousness, nervousness, socially isolating tendencies, vague SI no intent/harm, self-harm gestures, diminished motivation, trouble relaxing, self-esteem concerns; PCL5-endorsed symptoms: disturbing intrusive memories, emotional and physiological distress upon trauma cues, avoidance tendencies, strong negative beliefs and feelings, emotional distancing, irritability, risky behavior, easily startled, difficulty concentrating, sleep concerns  Mental Status Exam:  Appearance:   Casual     Behavior:  Appropriate and Sharing  Motor:  Normal  Speech/Language:   Clear and Coherent and Normal Rate  Affect:  Appropriate, Congruent, Depressed, and Tearful  Mood:  depressed and sad  Thought process:  normal  Thought content:     WNL  Sensory/Perceptual disturbances:    WNL  Orientation:  oriented to person, place, time/date, and situation  Attention:  Good  Concentration:  Good  Memory:  WNL  Fund of knowledge:   Good  Insight:    Good  Judgment:   Good  Impulse Control:  Good   Risk Assessment: Danger to Self:  vague SI no intent/plan Self-injurious Behavior: Yes.  without intent/plan Danger to Others: No Duty to Warn:no Physical Aggression / Violence:No  Access to Firearms a concern: No  Gang Involvement:No   Subjective: Patient presented to session to address concerns of anxiety, depression, trauma response pattern and grief.  Counselor and patient continue to build rapport.  Patient identified minimal progress at this time.  She reported feeling unable to go to work or leave the house having lost her best friend to sudden, unexpected cause.  She identified friend as having been her primary source of support for many years.  Counselor actively listened and affirmed patient feelings and experience, and normalized patient acute phase of grief and related impact to patient wellbeing.  Counselor encouraged patient continued rest and relaxation, and discussion with parent with whom patient lives regarding patient need for rest and support, voicing intention for self empowerment when she is feeling better, per mother's concern.  Counselor helped to facilitate insight into family relationships and dynamics; patient voiced helpfulness of discussion in understanding complexity of behavior patterns and motivations.  Patient identified being a comfort to her best friend's mom at this time.  She identified feeling overwhelmed, and having had vague SI without thoughts/plan, and gestures of self-harm (blunt edge of knife to arm without breaking skin).  Counselor discussed safety planning with patient, and patient voiced intention to seek help for 988 or emergency resources as needed.  Counselor facilitated PCL5 assessment and  patient scored a 38 raw score, 36 for positive symptoms; Counselor and patient discussed results.  Counselor and patient continued to discuss patient treatment planning and will follow-up upon next session.  Interventions: Solution-Oriented/Positive Psychology, Humanistic/Existential, Insight-Oriented, Pharmacist, Hospital, Resourcing, Treatment Planning, Assessment  Diagnosis:   ICD-10-CM   1. Episodic mood disorder  F39     2. Generalized anxiety disorder  F41.1     3. Post-traumatic stress disorder, chronic  F43.12     4. Grief reaction  F43.20       Plan: Patient is scheduled for follow-up; continue process work and developing coping skills, discussing patient symptomology and treatment plan, and obtaining consent for treatment plan.  Patient short-term goal between sessions to discuss continued rest around phase of grief with her mom, continue to prioritize self-care, seek emergency services as needed per safety planning.  Almarie ONEIDA Sprang, [facility]                   "

## 2025-01-18 ENCOUNTER — Ambulatory Visit: Payer: MEDICAID | Admitting: Behavioral Health
